# Patient Record
Sex: Male | Born: 1955
Health system: Southern US, Community
[De-identification: ages and names within clinical notes are randomized; demographics above are authoritative.]

## PROBLEM LIST (undated history)

## (undated) DIAGNOSIS — M1990 Unspecified osteoarthritis, unspecified site: Secondary | ICD-10-CM

## (undated) DIAGNOSIS — N200 Calculus of kidney: Secondary | ICD-10-CM

## (undated) DIAGNOSIS — E7800 Pure hypercholesterolemia, unspecified: Secondary | ICD-10-CM

## (undated) DIAGNOSIS — I10 Essential (primary) hypertension: Secondary | ICD-10-CM

## (undated) DIAGNOSIS — Z87442 Personal history of urinary calculi: Secondary | ICD-10-CM

## (undated) DIAGNOSIS — Z86010 Personal history of colonic polyps: Secondary | ICD-10-CM

## (undated) HISTORY — PX: APPENDECTOMY: SHX54

## (undated) HISTORY — DX: Pure hypercholesterolemia, unspecified: E78.00

## (undated) HISTORY — DX: Essential (primary) hypertension: I10

## (undated) HISTORY — DX: Calculus of kidney: N20.0

## (undated) HISTORY — PX: TONSILLECTOMY: SUR1361

## (undated) HISTORY — DX: Personal history of colonic polyps: Z86.010

---

## 2005-06-13 ENCOUNTER — Ambulatory Visit: Payer: Self-pay | Admitting: Family Medicine

## 2005-06-21 ENCOUNTER — Ambulatory Visit: Payer: Self-pay | Admitting: Family Medicine

## 2005-06-21 LAB — CONVERTED CEMR LAB
Blood Glucose, Fasting: 98 mg/dL
PSA: 0.66 ng/mL
TSH: 0.87 microintl units/mL

## 2006-11-22 ENCOUNTER — Encounter: Payer: Self-pay | Admitting: Family Medicine

## 2006-11-25 DIAGNOSIS — E7800 Pure hypercholesterolemia, unspecified: Secondary | ICD-10-CM | POA: Insufficient documentation

## 2006-12-12 ENCOUNTER — Ambulatory Visit: Payer: Self-pay | Admitting: Family Medicine

## 2006-12-12 DIAGNOSIS — I10 Essential (primary) hypertension: Secondary | ICD-10-CM | POA: Insufficient documentation

## 2006-12-18 ENCOUNTER — Ambulatory Visit: Payer: Self-pay | Admitting: Gastroenterology

## 2006-12-28 ENCOUNTER — Ambulatory Visit: Payer: Self-pay | Admitting: Family Medicine

## 2007-01-14 ENCOUNTER — Encounter: Payer: Self-pay | Admitting: Family Medicine

## 2007-01-14 ENCOUNTER — Ambulatory Visit: Payer: Self-pay | Admitting: Gastroenterology

## 2007-02-07 ENCOUNTER — Ambulatory Visit: Payer: Self-pay | Admitting: Family Medicine

## 2007-02-07 LAB — CONVERTED CEMR LAB
Albumin: 4.1 g/dL (ref 3.5–5.2)
Alkaline Phosphatase: 71 units/L (ref 39–117)
Bilirubin, Direct: 0.1 mg/dL (ref 0.0–0.3)
Cholesterol: 177 mg/dL (ref 0–200)
GFR calc Af Amer: 102 mL/min
PSA: 0.57 ng/mL (ref 0.10–4.00)
Potassium: 3.8 meq/L (ref 3.5–5.1)
Total Bilirubin: 1.4 mg/dL — ABNORMAL HIGH (ref 0.3–1.2)
Total Protein: 7.2 g/dL (ref 6.0–8.3)
Triglycerides: 140 mg/dL (ref 0–149)

## 2007-02-11 ENCOUNTER — Ambulatory Visit: Payer: Self-pay | Admitting: Family Medicine

## 2007-10-18 ENCOUNTER — Emergency Department (HOSPITAL_COMMUNITY): Admission: EM | Admit: 2007-10-18 | Discharge: 2007-10-18 | Payer: Self-pay | Admitting: Emergency Medicine

## 2008-04-05 ENCOUNTER — Ambulatory Visit: Payer: Self-pay | Admitting: Family Medicine

## 2008-04-05 DIAGNOSIS — M25559 Pain in unspecified hip: Secondary | ICD-10-CM | POA: Insufficient documentation

## 2008-04-09 LAB — CONVERTED CEMR LAB
Albumin: 4.1 g/dL (ref 3.5–5.2)
Alkaline Phosphatase: 72 units/L (ref 39–117)
BUN: 9 mg/dL (ref 6–23)
Chloride: 102 meq/L (ref 96–112)
Creatinine, Ser: 0.8 mg/dL (ref 0.4–1.5)
Eosinophils Absolute: 0.1 10*3/uL (ref 0.0–0.7)
Glucose, Bld: 83 mg/dL (ref 70–99)
MCHC: 35.1 g/dL (ref 30.0–36.0)
MCV: 82.9 fL (ref 78.0–100.0)
Monocytes Absolute: 0.6 10*3/uL (ref 0.1–1.0)
Neutrophils Relative %: 59.3 % (ref 43.0–77.0)
PSA: 0.63 ng/mL (ref 0.10–4.00)
Platelets: 216 10*3/uL (ref 150–400)
Sodium: 141 meq/L (ref 135–145)
Total Bilirubin: 0.8 mg/dL (ref 0.3–1.2)

## 2008-08-18 ENCOUNTER — Ambulatory Visit: Payer: Self-pay | Admitting: Family Medicine

## 2008-08-18 LAB — CONVERTED CEMR LAB
Albumin: 4.1 g/dL (ref 3.5–5.2)
Alkaline Phosphatase: 65 units/L (ref 39–117)
Basophils Absolute: 0 10*3/uL (ref 0.0–0.1)
Basophils Relative: 0 % (ref 0.0–3.0)
Bilirubin, Direct: 0 mg/dL (ref 0.0–0.3)
CO2: 31 meq/L (ref 19–32)
Calcium: 9.1 mg/dL (ref 8.4–10.5)
Eosinophils Absolute: 0.2 10*3/uL (ref 0.0–0.7)
Eosinophils Relative: 2.3 % (ref 0.0–5.0)
Glucose, Bld: 105 mg/dL — ABNORMAL HIGH (ref 70–99)
HDL: 39.6 mg/dL (ref >39.00)
Lymphs Abs: 2 10*3/uL (ref 0.7–4.0)
MCHC: 34.4 g/dL (ref 30.0–36.0)
Monocytes Absolute: 0.7 10*3/uL (ref 0.1–1.0)
Neutro Abs: 4.4 10*3/uL (ref 1.4–7.7)
PSA: 0.64 ng/mL (ref 0.10–4.00)
Platelets: 236 10*3/uL (ref 150.0–400.0)
Sodium: 142 meq/L (ref 135–145)
Total CHOL/HDL Ratio: 5
Total Protein: 7.5 g/dL (ref 6.0–8.3)
Triglycerides: 231 mg/dL — ABNORMAL HIGH (ref 0.0–149.0)
WBC: 7.3 10*3/uL (ref 4.5–10.5)

## 2008-09-06 ENCOUNTER — Ambulatory Visit: Payer: Self-pay | Admitting: Family Medicine

## 2008-09-06 DIAGNOSIS — R7303 Prediabetes: Secondary | ICD-10-CM | POA: Insufficient documentation

## 2008-09-06 DIAGNOSIS — R7309 Other abnormal glucose: Secondary | ICD-10-CM

## 2008-09-16 ENCOUNTER — Telehealth: Payer: Self-pay | Admitting: Family Medicine

## 2008-10-14 ENCOUNTER — Ambulatory Visit: Payer: Self-pay | Admitting: Internal Medicine

## 2008-10-14 ENCOUNTER — Telehealth: Payer: Self-pay | Admitting: Internal Medicine

## 2008-10-14 DIAGNOSIS — L03119 Cellulitis of unspecified part of limb: Secondary | ICD-10-CM

## 2008-10-14 DIAGNOSIS — L02519 Cutaneous abscess of unspecified hand: Secondary | ICD-10-CM | POA: Insufficient documentation

## 2009-02-09 ENCOUNTER — Ambulatory Visit: Payer: Self-pay | Admitting: Family Medicine

## 2009-02-09 DIAGNOSIS — R079 Chest pain, unspecified: Secondary | ICD-10-CM | POA: Insufficient documentation

## 2009-05-26 ENCOUNTER — Encounter: Payer: Self-pay | Admitting: Family Medicine

## 2009-05-26 ENCOUNTER — Telehealth: Payer: Self-pay | Admitting: Family Medicine

## 2009-09-06 ENCOUNTER — Encounter (INDEPENDENT_AMBULATORY_CARE_PROVIDER_SITE_OTHER): Payer: Self-pay | Admitting: *Deleted

## 2009-12-01 ENCOUNTER — Telehealth: Payer: Self-pay | Admitting: Family Medicine

## 2010-01-09 ENCOUNTER — Ambulatory Visit: Payer: Self-pay | Admitting: Family Medicine

## 2010-01-12 ENCOUNTER — Emergency Department (HOSPITAL_COMMUNITY)
Admission: EM | Admit: 2010-01-12 | Discharge: 2010-01-12 | Payer: Self-pay | Source: Home / Self Care | Admitting: Emergency Medicine

## 2010-02-04 ENCOUNTER — Ambulatory Visit
Admission: RE | Admit: 2010-02-04 | Discharge: 2010-02-04 | Payer: Self-pay | Source: Home / Self Care | Attending: Family Medicine | Admitting: Family Medicine

## 2010-02-28 NOTE — Letter (Signed)
Summary: Nadara Eaton letter  [place] at [facility]  [address] [place], [place] [address]   Phone: [phone]  Fax: [phone]       09/06/2009 MRN: [id number]  Orson Raisch [address] [place], [place]  [address]  Dear Mr. Basista,  [facility] [place], and [place] announce the retirement of Arta Silence, M.D., from full-time practice at the [facility] office effective July 28, 2009 and his plans of returning part-time.  It is important to Dr. Hetty Ely and to our practice that you understand that [facility] has seven physicians in our office for your health care needs.  We will continue to offer the same exceptional care that you have today.    Dr. Hetty Ely has spoken to many of you about his plans for retirement and returning part-time in the fall.   We will continue to work with you through the transition to schedule appointments for you in the office and meet the high standards that [place] is committed to.   Again, it is with great pleasure that we share the news that Dr. Hetty Ely will return to [facility] at [facility] in October of 2011 with a reduced schedule.    If you have any questions, or would like to request an appointment with one of our physicians, please call us at [phone] and press the option for Scheduling an appointment.  We take pleasure in providing you with excellent patient care and look forward to seeing you at your next office visit.  Our [facility] Physicians are:  Tillman Abide, M.D. Laurita Quint, M.D. Roxy Manns, M.D. Kerby Nora, M.D. Hannah Beat, M.D. Ruthe Mannan, M.D. We proudly welcomed Raechel Ache, M.D. and Eustaquio Boyden, M.D. to the practice in July/August 2011.  Sincerely,  [facility]

## 2010-02-28 NOTE — Assessment & Plan Note (Signed)
Summary: CHEST PAIN/TIGHTNESS /RBH   Vital Signs:  Patient profile:   55 year old male Height:      72 inches Weight:      229 pounds BMI:     31.17 O2 Sat:      93 % on Room air Pulse rate:   106 / minute Pulse rhythm:   regular BP sitting:   140 / 98  (left arm) Cuff size:   regular  Vitals Entered By: Benny Lennert CMA (AAMA) (February 09, 2009 10:07 AM)  O2 Flow:  Room air  Serial Vital Signs/Assessments:  Time      Position  BP       Pulse  Resp  Temp     By 10:20 AM                     92                    Jshon Ibe MD                                PEF    PreRx  PostRx Time      O2 Sat  O2 Type     L/min  L/min  L/min   By 10:20 AM  98  %   Room air                          Karleen Hampshire Kaneisha Ellenberger MD   History of Present Illness: Chief complaint chest pain short ness of breathe  55 year old male:  Hot and cold chills Got up to 102 fever  Acute Visit History:      The patient complains of chest pain, cough, fever, and nasal discharge.  These symptoms began 3 days ago.  He denies headache, nausea, sinus problems, sore throat, and vomiting.        His highest temperature has been 102.  This temperature was recorded last evening.  The fever has been off and on.        The patient notes shortness of breath.  The character of the cough is described as productive.  He has no history of COPD.  There is no history of wheezing, sleep interference, respiratory retractions, tachypnea, cyanosis, or interference with oral intake associated with his cough.        Urine output has been normal.  He is tolerating clear liquids.        Allergies (verified): No Known Drug Allergies  Past History:  Past medical, surgical, family and social histories (including risk factors) reviewed, and no changes noted (except as noted below).  Past Medical History: Reviewed history from 04/05/2008 and no changes required. ESSENTIAL HYPERTENSION, BENIGN (ICD-401.1) FM HX MALIGNANT NEOPLASM  GASTROINTESTINAL TRACT (ICD-V16.0) HYPERCHOLESTEROLEMIA (ICD-272.0)    Past Surgical History: Reviewed history from 09/06/2008 and no changes required. TONSILLECTOMY  AS CHILD COLONOSCOPY NML 01/14/2007  Family History: Reviewed history from 09/06/2008 and no changes required. Father:? NO KNOWLEDGE  Mother: DECEASED 16 YOA ENLARGED HEART BROTHER DECEASED  91 YOA HIV DZ. SISTER 44YOA DECEASED FROM COLON CANCER SISTER  dec  56  Breast cancer SISTER  A 62   CV: NEGATIVE HBP: + SELF; + SISTER DM: NEGATIVE GOUT/ARTHIITIS: PROSTATE CANCER: NEGATIVE BREAST CANCER: + OLDEST SISTER COLON CANCER: + SISTER DECEASED AT 44 YOA DEPRESSION: NEGATIVE ETOH/DRUG ABUSE: OTHER: NEGATIVE STROKE  Social History: Reviewed history from 11/22/2006 and no changes required. Marital Status: Married: REMARRIED LIVES WITH WIFE  Children: 2 DAUGHTERS FROM PREVIOUS MARRIAGE Occupation:[employer]/ [employer]   Review of Systems       REVIEW OF SYSTEMS GEN: Acute illness details above. CV: No chest pain or SOB GI: No noted N or V Otherwise, pertinent positives and negatives are noted in the HPI.   Physical Exam  Additional Exam:  GEN: A and O x 3. WDWN. NAD.    ENT: Nose clear, ext NML.  No LAD.  No JVD.  TM's clear. Oropharynx clear.  PULM: Normal WOB, no distress. No crackles, no wheezes. coarse rhonchi diffusely, but no distress. CV: RRR, no M/G/R, No rubs, No JVD.   ABD: S, NT, ND, + BS. No rebound. No guarding. No HSM.   EXT: warm and well-perfused, No c/c/e. PSYCH: Pleasant and conversant.    Impression & Recommendations:  Problem # 1:  BRONCHITIS- ACUTE (ICD-466.0) Assessment New Pulse ox 98% on my recheck no tachynpeic  EKG: Normal sinus rhythm. Normal axis, normal R wave progression, No acute ST elevation or depression.   Focal lung infection, but no crackles outpatient management  The following medications were removed from the medication list:    Amoxicillin-pot  Clavulanate 875-125 Mg Tabs (Amoxicillin-pot clavulanate) .Marland Kitchen... 1 tab by mouth two times a day after eating for finger infection His updated medication list for this problem includes:    Doxycycline Hyclate 100 Mg Caps (Doxycycline hyclate) .Marland Kitchen... Take 1 tab twice a day  Problem # 2:  CHEST PAIN (ICD-786.50) Assessment: New EKG: Normal sinus rhythm. Normal axis, normal R wave progression, No acute ST elevation or depression.   c/w lung infection  Complete Medication List: 1)  Hydrochlorothiazide 25 Mg Tabs (Hydrochlorothiazide) .Marland Kitchen.. 1 daily by mouth 2)  Doxycycline Hyclate 100 Mg Caps (Doxycycline hyclate) .... Take 1 tab twice a day Prescriptions: DOXYCYCLINE HYCLATE 100 MG CAPS (DOXYCYCLINE HYCLATE) Take 1 tab twice a day  #20 x 0   Entered and Authorized by:   Hannah Beat MD   Signed by:   Hannah Beat MD on 02/09/2009   Method used:   [facility]  [address] [phone][facility] (retail)       [address]       [place], [place]  [id number]       Ph: [phone] or [phone]       Fax: [phone]   RxID:   [id number]   Current Allergies (reviewed today): No known allergies

## 2010-02-28 NOTE — Progress Notes (Signed)
Summary: Dizzy  Phone Note Call from Patient   Caller: Patient Call For: Shaune Leeks MD Summary of Call: Patient came into the office today complaining of being dizzy for one week.  He says that he has no SOB, no headaches, no wheezing.  He says that these dizzy spells come on at any time.  He could be at work, at home, and doing nothing in particular.  He has not had any medication changes.  Talked with Dr. Hetty Ely and asked if it was ok to schedule him to be seen on Monday.  Dr. Hetty Ely agreed.  Advised patient to keep a journal of how many times these dizzy spells come on, what he is doing at that time, and what time of day it is.  Also advised him that if he becomes SOB, starts wheezing, or become lightheaded he needs to have someone take him to an Urgent Care or the ER.  Appt scheduled for 3:45 on Monday with Dr. Hetty Ely.   Initial call taken by: Linde Gillis CMA Duncan Dull),  May 26, 2009 8:13 AM  Follow-up for Phone Call        Agree. Follow-up by: Shaune Leeks MD,  May 26, 2009 8:16 AM

## 2010-02-28 NOTE — Letter (Signed)
Summary: Schaller Retirement letter  [place] at [place]  [address]   [place], [place] [address]   Phone: [phone]  Fax: [phone]       09/06/2009 MRN: [id number]  Jerauld Stipe [address] [place], [place]  [address]  Dear Mr. Anastos,  [facility] [place], and [place] announce the retirement of ROBERT N. SCHALLER, M.D., from full-time practice at the [place] office effective July 28, 2009 and his plans of returning part-time.  It is important to Dr. Schaller and to our practice that you understand that [facility] [place] has seven physicians in our office for your health care needs.  We will continue to offer the same exceptional care that you have today.    Dr. Schaller has spoken to many of you about his plans for retirement and returning part-time in the fall.   We will continue to work with you through the transition to schedule appointments for you in the office and meet the high standards that [facility] is committed to.   Again, it is with great pleasure that we share the news that Dr. Schaller will return to [facility] [place] in October of 2011 with a reduced schedule.    If you have any questions, or would like to request an appointment with one of our physicians, please call us at [phone] and press the option for Scheduling an appointment.  We take pleasure in providing you with excellent patient care and look forward to seeing you at your next office visit.  Our [place] Physicians are:  Richard Letvak, M.D. Robert Schaller, M.D. Marne Tower, M.D. Amy Bedsole, M.D. Spencer Copland, M.D. Talia Aron, M.D. We proudly welcomed Shaw Duncan, M.D. and Javier Gutierrez, M.D. to the practice in July/August 2011.  Sincerely,  [facility] [place] 

## 2010-02-28 NOTE — Progress Notes (Signed)
Summary: RX HCTZ  Phone Note Refill Request Call back at [phone] Message from:  [facility] on December 01, 2009 4:31 PM  Refills Requested: Medication #1:  HYDROCHLOROTHIAZIDE 25 MG  TABS 1 daily by mouth. PATIENT WAS GIVEN #15  ON 11/08/09 AND INFORMED TO SCHEDULE AN APPOINTMENT FOR FURTHER REFILLS. NO APPOINTMENT SCHEDULED.   Method Requested: Electronic Initial call taken by: Sydell Axon LPN,  December 01, 2009 4:33 PM  Follow-up for Phone Call        Rx called to pharmacy as instructed. Left a message on patient's voicemail to call and schedule an appointment. Follow-up by: Sydell Axon LPN,  December 01, 2009 4:49 PM    Prescriptions: HYDROCHLOROTHIAZIDE 25 MG  TABS (HYDROCHLOROTHIAZIDE) 1 daily by mouth  #10 x 0   Entered and Authorized by:   Shaune Leeks MD   Signed by:   Shaune Leeks MD on 12/01/2009   Method used:   Telephoned to ...       [facility]  [address] [phone]* (retail)       [address]       [place], [place]  [address]       Ph: [phone] or [phone]       Fax: [phone]   RxID:   [id number]  Will need to be seen to get further refills. Shaune Leeks MD  December 01, 2009 4:43 PM

## 2010-02-28 NOTE — Letter (Signed)
Summary: Out of Work  [facility] [employer] [facility] at [facility]  [address] [place], [place] [address]   Phone: [phone]  Fax: [phone]    May 26, 2009   Employee:  Josuha Carlile    To Whom It May Concern:   For Medical reasons, please excuse the above named employee from work for the following dates:  Start:   05/26/2009  End:   05/26/2009 Patient was seen in our office this morning and may return to work today.  If you need additional information, please feel free to contact our office.         Sincerely,    Linde Gillis CMA (AAMA)

## 2010-02-28 NOTE — Letter (Signed)
Summary: Out of Work  [facility] at [facility]  [address] [place], [place] [address]   Phone: [phone]  Fax: [phone]    February 09, 2009   Employee:  Jaymari Basu    To Whom It May Concern:   For Medical reasons, please excuse the above named employee from work for the following dates:  Start:   02/09/2009  End:   01/14/2010  If you need additional information, please feel free to contact our office.         Sincerely,    Hannah Beat MD

## 2010-03-02 NOTE — Assessment & Plan Note (Signed)
Summary: MED REFILL/DLO   Vital Signs:  Patient profile:   55 year old male Height:      72 inches Weight:      238.25 pounds BMI:     32.43 Temp:     98.6 degrees F oral Pulse rate:   96 / minute Pulse rhythm:   regular BP sitting:   150 / 90  (left arm) Cuff size:   regular  Vitals Entered By: Benny Lennert CMA Duncan Dull) (January 09, 2010 3:52 PM)  History of Present Illness: Chief complaint medication refill  55 year old male:  Feeling OK  HTN: ran out of blood pressue medications.   Hypertension History:      He denies headache, chest pain, palpitations, dyspnea with exertion, orthopnea, PND, peripheral edema, visual symptoms, neurologic problems, syncope, and side effects from treatment.        Positive major cardiovascular risk factors include male age 57 years old or older, hyperlipidemia, and hypertension.  Negative major cardiovascular risk factors include non-tobacco-user status.    Allergies (verified): No Known Drug Allergies  Past History:  Past medical, surgical, family and social histories (including risk factors) reviewed, and no changes noted (except as noted below).  Past Medical History: Reviewed history from 04/05/2008 and no changes required. ESSENTIAL HYPERTENSION, BENIGN (ICD-401.1) FM HX MALIGNANT NEOPLASM GASTROINTESTINAL TRACT (ICD-V16.0) HYPERCHOLESTEROLEMIA (ICD-272.0)    Past Surgical History: Reviewed history from 09/06/2008 and no changes required. TONSILLECTOMY  AS CHILD COLONOSCOPY NML 01/14/2007  Family History: Reviewed history from 09/06/2008 and no changes required. Father:? NO KNOWLEDGE  Mother: DECEASED 8 YOA ENLARGED HEART BROTHER DECEASED  44 YOA HIV DZ. SISTER 44YOA DECEASED FROM COLON CANCER SISTER  dec  56  Breast cancer SISTER  A 62   CV: NEGATIVE HBP: + SELF; + SISTER DM: NEGATIVE GOUT/ARTHIITIS: PROSTATE CANCER: NEGATIVE BREAST CANCER: + OLDEST SISTER COLON CANCER: + SISTER DECEASED AT 40 YOA DEPRESSION:  NEGATIVE ETOH/DRUG ABUSE: OTHER: NEGATIVE STROKE  Social History: Reviewed history from 11/22/2006 and no changes required. 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 CV: No chest pain or SOB GI: No noted N or V Otherwise, pertinent positives and negatives are noted in the HPI.   Physical Exam  Additional Exam:  GEN: WDWN, NAD, Non-toxic, A & O x 3 HEENT: Atraumatic, Normocephalic. Neck supple. No masses, No LAD. Ears and Nose: No external deformity. CV: RRR, No M/G/R. No JVD. No thrill. No extra heart sounds. PULM: CTA B, no wheezes, crackles, rhonchi. No retractions. No resp. distress. No accessory muscle use. EXTR: No c/c/e NEURO: Normal gait.  PSYCH: Normally interactive. Conversant. Not depressed or anxious appearing.  Calm demeanor.     Impression & Recommendations:  Problem # 1:  ESSENTIAL HYPERTENSION, BENIGN (ICD-401.1) Assessment Deteriorated  His updated medication list for this problem includes:    Hydrochlorothiazide 25 Mg Tabs (Hydrochlorothiazide) .Marland Kitchen... 1 daily by mouth  Complete Medication List: 1)  Hydrochlorothiazide 25 Mg Tabs (Hydrochlorothiazide) .Marland Kitchen.. 1 daily by mouth  Other Orders: Admin 1st Vaccine ([address]) Flu Vaccine 67yrs + ([address])  Hypertension Assessment/Plan:      The patient's hypertensive risk group is category B: At least one risk factor (excluding diabetes) with no target organ damage.  Today's blood pressure is 150/90.    Patient Instructions: 1)  CPX with  Dr. Hetty Ely at the patient's convenience 2)  Prephysical Labs, several days before, fasting 3)  BMP, HFP, FLP, CBC with diff, TSH, PSA: v77.91, v58.69, v76.44  Prescriptions: HYDROCHLOROTHIAZIDE 25 MG  TABS (HYDROCHLOROTHIAZIDE) 1 daily by mouth  #30 x 11   Entered and Authorized by:    Hannah Beat MD   Signed by:   Hannah Beat MD on 01/09/2010   Method used:   [facility]        [facility]  [address] [phone]* (retail)       [address]       [place], [place]  [address]       Ph: [phone] or [phone]       Fax: [phone]   RxID:   [id number]    Orders Added: 1)  Admin 1st Vaccine [90471] 2)  Flu Vaccine 54yrs + [[address]956] 3)  Est. Patient Level III [[address]]    Current Allergies (reviewed today): No known allergies             Flu Vaccine Consent Questions     Do you have a history of severe allergic reactions to this vaccine? no    Any prior history of allergic reactions to egg and/or gelatin? no    Do you have a sensitivity to the preservative Thimersol? no    Do you have a past history of Guillan-Barre Syndrome? no    Do you currently have an acute febrile illness? no    Have you ever had a severe reaction to latex? no    Vaccine information given and explained to patient? yes    Are you currently pregnant? no    Lot Number:[id number]   Exp Date:07/29/2010   Site Given  Left Deltoid IM   .[email or web address]

## 2010-03-02 NOTE — Assessment & Plan Note (Signed)
Summary: SORE THROAT X 6 DAYS/VJ   Vital Signs:  Patient profile:   55 year old male Height:      72 inches Weight:      232 pounds BMI:     31.58 Temp:     98.0 degrees F Pulse rate:   88 / minute BP sitting:   138 / 90  (left arm) Cuff size:   large  Vitals Entered By: Payton Spark CMA (February 04, 2010 9:19 AM) CC: Sinus congestion, chills and ST x 2 weeks.   CC:  Sinus congestion and chills and ST x 2 weeks.Marland Kitchen  History of Present Illness: 55 y/o mm nonsmoker but sec...wife smokes...who comes in for eval of sore throat cough x 2 weeks....ros neg...sym. started after around sick grandkids  Current Medications (verified): 1)  Hydrochlorothiazide 25 Mg  Tabs (Hydrochlorothiazide) .Marland Kitchen.. 1 Daily By Mouth  Allergies (verified): No Known Drug Allergies  Past History:  Past medical, surgical, family and social histories (including risk factors) reviewed for relevance to current acute and chronic problems.  Past Medical History: Reviewed history from 04/05/2008 and no changes required. ESSENTIAL HYPERTENSION, BENIGN (ICD-401.1) FM HX MALIGNANT NEOPLASM GASTROINTESTINAL TRACT (ICD-V16.0) HYPERCHOLESTEROLEMIA (ICD-272.0)    Past Surgical History: Reviewed history from 09/06/2008 and no changes required. TONSILLECTOMY  AS CHILD COLONOSCOPY NML 01/14/2007  Family History: Reviewed history from 09/06/2008 and no changes required. Father:? NO KNOWLEDGE  Mother: DECEASED 34 YOA ENLARGED HEART BROTHER DECEASED  49 YOA HIV DZ. SISTER 44YOA DECEASED FROM COLON CANCER SISTER  dec  56  Breast cancer SISTER  A 62   CV: NEGATIVE HBP: + SELF; + SISTER DM: NEGATIVE GOUT/ARTHIITIS: PROSTATE CANCER: NEGATIVE BREAST CANCER: + OLDEST SISTER COLON CANCER: + SISTER DECEASED AT 25 YOA DEPRESSION: NEGATIVE ETOH/DRUG ABUSE: OTHER: NEGATIVE STROKE  Social History: Reviewed history from 11/22/2006 and no changes required. 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 No apparent alopecia or balding. Eyes:  No corneal or conjunctival inflammation noted. EOMI. Perrla. Funduscopic exam benign, without hemorrhages, exudates or papilledema. Vision grossly normal. Ears:  External ear exam shows no significant lesions or deformities.  Otoscopic examination reveals clear canals, tympanic membranes are intact bilaterally without bulging, retraction, inflammation or discharge. Hearing is grossly normal bilaterally. Nose:  External nasal examination shows no deformity or inflammation. Nasal mucosa are pink and moist without lesions or exudates. Mouth:  Oral mucosa and oropharynx without lesions or exudates.  Teeth in good repair. Neck:  No deformities, masses, or tenderness noted. Lungs:  Normal respiratory effort, chest expands symmetrically. Lungs are clear to auscultation, no crackles or wheezes.   Problems:  Medical Problems Added: 1)  Dx of Viral Infection-unspec  (ICD-079.99)  Impression & Recommendations:  Problem # 1:  VIRAL INFECTION-UNSPEC (ICD-079.99) Assessment New  His updated medication list for this problem includes:    Hydromet 5-1.5 Mg/89ml Syrp (Hydrocodone-homatropine) .Marland Kitchen... 1/2 to 1 tsp three times a day prn  Complete Medication List: 1)  Hydrochlorothiazide 25 Mg Tabs (Hydrochlorothiazide) .Marland Kitchen.. 1 daily by mouth 2)  Hydromet 5-1.5 Mg/66ml Syrp (Hydrocodone-homatropine) .... 1/2 to 1 tsp three times a day prn  Other Orders: Rapid Strep ([address])  Patient Instructions: 1)  stay in a smokefree env 2)  drink lots of water 3)  vaporizer at bedtime 4)  hydromet 1/2 to 1 tsp at bedtime prn Prescriptions: HYDROMET 5-1.5 MG/5ML SYRP  (HYDROCODONE-HOMATROPINE) 1/2 to 1 tsp three times a day prn  #8oz x 1   Entered and Authorized by:   Roderick Pee MD   Signed by:   Roderick Pee MD on 02/04/2010   Method used:   Print then Give to Patient   RxID:   [id number]    Orders Added: 1)  Rapid Strep [95638] 2)  Est. Patient Level III [75643]    Laboratory Results    Other Tests  Rapid Strep: negative

## 2010-03-09 ENCOUNTER — Other Ambulatory Visit: Payer: Self-pay

## 2010-03-16 ENCOUNTER — Encounter: Payer: Self-pay | Admitting: Family Medicine

## 2010-03-28 ENCOUNTER — Encounter: Payer: Self-pay | Admitting: Family Medicine

## 2010-04-17 ENCOUNTER — Other Ambulatory Visit: Payer: Self-pay | Admitting: Family Medicine

## 2010-04-17 ENCOUNTER — Encounter: Payer: Self-pay | Admitting: *Deleted

## 2010-04-17 ENCOUNTER — Other Ambulatory Visit (INDEPENDENT_AMBULATORY_CARE_PROVIDER_SITE_OTHER): Payer: BC Managed Care – PPO

## 2010-04-17 DIAGNOSIS — Z125 Encounter for screening for malignant neoplasm of prostate: Secondary | ICD-10-CM

## 2010-04-17 DIAGNOSIS — E785 Hyperlipidemia, unspecified: Secondary | ICD-10-CM

## 2010-04-17 DIAGNOSIS — R079 Chest pain, unspecified: Secondary | ICD-10-CM

## 2010-04-17 DIAGNOSIS — E7800 Pure hypercholesterolemia, unspecified: Secondary | ICD-10-CM

## 2010-04-17 DIAGNOSIS — R7309 Other abnormal glucose: Secondary | ICD-10-CM

## 2010-04-17 LAB — CBC WITH DIFFERENTIAL/PLATELET
Basophils Absolute: 0 10*3/uL (ref 0.0–0.1)
Basophils Relative: 0.4 % (ref 0.0–3.0)
Eosinophils Relative: 2.9 % (ref 0.0–5.0)
Lymphocytes Relative: 25 % (ref 12.0–46.0)
Lymphs Abs: 2 10*3/uL (ref 0.7–4.0)
MCV: 85.3 fl (ref 78.0–100.0)
Monocytes Absolute: 0.7 10*3/uL (ref 0.1–1.0)
Neutro Abs: 4.9 10*3/uL (ref 1.4–7.7)
Neutrophils Relative %: 62.5 % (ref 43.0–77.0)
RDW: 13.6 % (ref 11.5–14.6)

## 2010-04-17 LAB — HEPATIC FUNCTION PANEL
ALT: 37 U/L (ref 0–53)
AST: 26 U/L (ref 0–37)
Albumin: 4.5 g/dL (ref 3.5–5.2)
Bilirubin, Direct: 0.1 mg/dL (ref 0.0–0.3)
Total Bilirubin: 0.7 mg/dL (ref 0.3–1.2)
Total Protein: 7.3 g/dL (ref 6.0–8.3)

## 2010-04-17 LAB — BASIC METABOLIC PANEL
Calcium: 9.4 mg/dL (ref 8.4–10.5)
Creatinine, Ser: 0.9 mg/dL (ref 0.4–1.5)
GFR: 99.06 mL/min (ref >60.00)
Glucose, Bld: 106 mg/dL — ABNORMAL HIGH (ref 70–99)
Sodium: 136 mEq/L (ref 135–145)

## 2010-04-17 LAB — LIPID PANEL: Total CHOL/HDL Ratio: 4

## 2010-04-20 ENCOUNTER — Ambulatory Visit (INDEPENDENT_AMBULATORY_CARE_PROVIDER_SITE_OTHER): Payer: BC Managed Care – PPO | Admitting: Family Medicine

## 2010-04-20 ENCOUNTER — Encounter: Payer: Self-pay | Admitting: Family Medicine

## 2010-04-20 VITALS — BP 132/86 | HR 80 | Temp 98.9°F | Ht 72.0 in | Wt 227.5 lb

## 2010-04-20 DIAGNOSIS — M25559 Pain in unspecified hip: Secondary | ICD-10-CM

## 2010-04-20 DIAGNOSIS — Z0000 Encounter for general adult medical examination without abnormal findings: Secondary | ICD-10-CM | POA: Insufficient documentation

## 2010-04-20 DIAGNOSIS — I10 Essential (primary) hypertension: Secondary | ICD-10-CM

## 2010-04-20 DIAGNOSIS — R079 Chest pain, unspecified: Secondary | ICD-10-CM

## 2010-04-20 DIAGNOSIS — L719 Rosacea, unspecified: Secondary | ICD-10-CM | POA: Insufficient documentation

## 2010-04-20 DIAGNOSIS — E7800 Pure hypercholesterolemia, unspecified: Secondary | ICD-10-CM

## 2010-04-20 DIAGNOSIS — R7309 Other abnormal glucose: Secondary | ICD-10-CM

## 2010-04-20 DIAGNOSIS — L02519 Cutaneous abscess of unspecified hand: Secondary | ICD-10-CM

## 2010-04-20 DIAGNOSIS — L03119 Cellulitis of unspecified part of limb: Secondary | ICD-10-CM

## 2010-04-20 MED ORDER — METRONIDAZOLE 1 % EX GEL: Freq: Every day | CUTANEOUS | Status: DC

## 2010-04-20 NOTE — Assessment & Plan Note (Signed)
High..The patient is asked to make an attempt to improve diet and exercise patterns to aid in medical management of this problem.

## 2010-04-20 NOTE — Assessment & Plan Note (Signed)
Resolved

## 2010-04-20 NOTE — Assessment & Plan Note (Signed)
New diagnosis. Given medicatiopn to try. RTC if not helpful.

## 2010-04-20 NOTE — Assessment & Plan Note (Signed)
Now having sciatica rather than hip pain. Discussed conservative measures and exercises to do.

## 2010-04-20 NOTE — Patient Instructions (Signed)
Work hard on diet and exercise. Do exercises for back daily. RTC prn or one year for recheck. Colonoscopy next year.

## 2010-04-20 NOTE — Progress Notes (Signed)
  Subjective:    [name], male    DOB: 05/26/1955, 55 y.o.   MRN: [id number]  HPI   Pt here for Comp Exam. Lacie Draft well except for occas left sciatica. Discussed. He had cellulitis of the hand which is now resolved and had a URI this month which also has resolved. He was seen for L hip pain which has now gone into L sciatica. He also has developed a rash in the malar distribution.    Review of Systems  Constitutional: Negative for fever, chills, activity change, appetite change and fatigue.  HENT: Negative for hearing loss, ear pain, nosebleeds, neck pain, neck stiffness, tinnitus and ear discharge.   Eyes: Negative for pain, discharge and visual disturbance.  Respiratory: Negative for cough, chest tightness, shortness of breath and wheezing.   Cardiovascular: Negative for chest pain and palpitations.  Gastrointestinal: Negative for nausea, vomiting, abdominal pain, diarrhea, constipation, blood in stool, abdominal distention and anal bleeding.  Genitourinary: Negative for dysuria, decreased urine volume and difficulty urinating.  Musculoskeletal: Negative for myalgias, back pain and arthralgias.  Skin: Negative for color change and rash.  Neurological: Negative for dizziness, tremors, syncope and numbness.  Hematological: Negative for adenopathy. Does not bruise/bleed easily.  Psychiatric/Behavioral: Negative for agitation.       Objective:   Physical Exam  Constitutional: He is oriented to person, place, and time. He appears well-developed and well-nourished. No distress.  HENT:  Head: Normocephalic and atraumatic.  Right Ear: External ear normal.  Left Ear: External ear normal.  Nose: Nose normal.  Mouth/Throat: Oropharynx is clear and moist.  Eyes: Conjunctivae and EOM are normal. Pupils are equal, round, and reactive to light. No scleral icterus.  Neck: No thyromegaly present.  Cardiovascular: Normal rate, regular rhythm, normal heart sounds and intact distal  pulses.   No murmur heard. Pulmonary/Chest: Effort normal and breath sounds normal. No respiratory distress. He has no wheezes. He exhibits no tenderness.  Abdominal: Soft. Bowel sounds are normal. He exhibits no distension and no mass. There is no tenderness.  Genitourinary: Rectum normal, prostate normal and penis normal. Guaiac negative stool.  Musculoskeletal: Normal range of motion. He exhibits no edema and no tenderness.       C/O L sciatica but no tenderness noted over L/S area.  Lymphadenopathy:    He has no cervical adenopathy.  Neurological: He is oriented to person, place, and time. Coordination normal.  Skin: Skin is dry. Rash noted. He is not diaphoretic. No erythema.       Facial rash in the malar distribution.  Psychiatric: He has a normal mood and affect. His behavior is normal. Judgment and thought content normal.          Assessment & Plan:    Physical Exam.

## 2010-04-20 NOTE — Assessment & Plan Note (Signed)
Good control with good labs. Cont curr meds.

## 2010-04-20 NOTE — Assessment & Plan Note (Signed)
Trig high as is FBS..he knows he can be more careful with his diet and he knows he should be exercising more. Should be able to get both of these under control.

## 2010-06-13 NOTE — Assessment & Plan Note (Signed)
[facility]                                 ON-CALL NOTE   NAME:REVERONMaxi, Rodas                      MRN:          [id number]  DATE:10/18/2007                            DOB:          [birth year]    CALLER:  Ms. Enid Derry.   PHONE NUMBER:  [phone]   TIME OF CALL:  At 8:31 a.m.   REGULAR PHYSICIAN:  Arta Silence, MD   CHIEF COMPLAINT:  High fever and shortness of breath.   Ms. Bonsignore states that they just got back from [place] [facility].  They both  came home sick with cold.  However, Mr. Benavides seems to be getting much  worse.  He has had a high fever, sore throat, and headache.  He seems to  be getting worse.  She has been giving him cough syrup and hot tea and  Tylenol.  This morning, he is struggling to breathe.  He is complaining  of shortness of breath and his chest hurts.  She is worried he is  getting worse.  I told her in light of his shortness of breath, to take  him to the emergency room for evaluation, to call 911 if they needed  transport, and she thinks she can take him herself.     Marne A. Tower, MD  Electronically Signed    MAT/MedQ  DD: 10/18/2007  DT: 10/18/2007  Job #: [id number]

## 2010-07-06 ENCOUNTER — Other Ambulatory Visit: Payer: Self-pay | Admitting: Family Medicine

## 2010-07-06 NOTE — Telephone Encounter (Signed)
TO pcp

## 2010-07-07 NOTE — Telephone Encounter (Signed)
Medication phoned to [facility] [address] pharmacy as instructed.

## 2010-08-04 ENCOUNTER — Ambulatory Visit (INDEPENDENT_AMBULATORY_CARE_PROVIDER_SITE_OTHER): Payer: BC Managed Care – PPO | Admitting: Family Medicine

## 2010-08-04 ENCOUNTER — Encounter: Payer: Self-pay | Admitting: Family Medicine

## 2010-08-04 DIAGNOSIS — J069 Acute upper respiratory infection, unspecified: Secondary | ICD-10-CM

## 2010-08-04 MED ORDER — AMOXICILLIN 875 MG PO TABS: 875.0000 mg | ORAL_TABLET | Freq: Two times a day (BID) | ORAL | Status: AC

## 2010-08-04 NOTE — Progress Notes (Signed)
duration of symptoms:  Started 2-3 days ago.   rhinorrhea:no Congestion:yes  ear pain:no sore throat:yes cough:minimal myalgias:no other concerns: self limited nosebleeds- this has happened before.  No fevers.  Working at [employer].  Sick contacts known.    ROS: See HPI.  Otherwise negative.    Meds, vitals, and allergies reviewed.   GEN: nad, alert and oriented HEENT: mucous membranes moist, TM w/o erythema, nasal epithelium injected with purulent discharge, OP with cobblestoning NECK: supple w/o LA CV: rrr. PULM: ctab, no inc wob ABD: soft, +bs EXT: no edema

## 2010-08-04 NOTE — Patient Instructions (Signed)
Drink plenty of fluids, take tylenol as needed, and start the amoxil in 1 week if not improved.  Take care.  Let us know if you have other concerns.   Glad to see you today.

## 2010-08-05 NOTE — Assessment & Plan Note (Signed)
Likely viral.  D/w pt about about ddx.  He'll hold the rx for abx.  Supportive tx in meantime and start the amoxil if sx don't resolve in a week.  Okay for outpatient f/u.

## 2011-02-27 ENCOUNTER — Other Ambulatory Visit: Payer: Self-pay | Admitting: *Deleted

## 2011-02-27 DIAGNOSIS — L719 Rosacea, unspecified: Secondary | ICD-10-CM

## 2011-02-27 MED ORDER — METRONIDAZOLE 1 % EX GEL: Freq: Every day | CUTANEOUS | Status: DC

## 2011-07-09 ENCOUNTER — Other Ambulatory Visit: Payer: Self-pay | Admitting: Family Medicine

## 2011-10-23 ENCOUNTER — Other Ambulatory Visit: Payer: Self-pay | Admitting: Family Medicine

## 2011-12-05 ENCOUNTER — Encounter: Payer: Self-pay | Admitting: Gastroenterology

## 2011-12-13 ENCOUNTER — Encounter: Payer: Self-pay | Admitting: Gastroenterology

## 2012-01-30 DIAGNOSIS — Z860100 Personal history of colon polyps, unspecified: Secondary | ICD-10-CM

## 2012-01-30 HISTORY — DX: Personal history of colonic polyps: Z86.010

## 2012-01-30 HISTORY — DX: Personal history of colon polyps, unspecified: Z86.0100

## 2012-01-30 HISTORY — PX: COLONOSCOPY: SHX174

## 2012-02-01 ENCOUNTER — Ambulatory Visit (AMBULATORY_SURGERY_CENTER): Payer: BC Managed Care – PPO | Admitting: *Deleted

## 2012-02-01 VITALS — Ht 72.0 in | Wt 225.8 lb

## 2012-02-01 DIAGNOSIS — Z1211 Encounter for screening for malignant neoplasm of colon: Secondary | ICD-10-CM

## 2012-02-01 MED ORDER — MOVIPREP 100 G PO SOLR: ORAL | Status: DC

## 2012-02-04 ENCOUNTER — Encounter: Payer: Self-pay | Admitting: Gastroenterology

## 2012-02-14 ENCOUNTER — Telehealth: Payer: Self-pay | Admitting: Gastroenterology

## 2012-02-14 NOTE — Telephone Encounter (Signed)
No charge. 

## 2012-02-15 ENCOUNTER — Other Ambulatory Visit: Payer: BC Managed Care – PPO | Admitting: Gastroenterology

## 2012-02-26 ENCOUNTER — Ambulatory Visit (AMBULATORY_SURGERY_CENTER): Payer: BC Managed Care – PPO | Admitting: Gastroenterology

## 2012-02-26 ENCOUNTER — Encounter: Payer: Self-pay | Admitting: Gastroenterology

## 2012-02-26 VITALS — BP 130/86 | HR 82 | Temp 96.8°F | Resp 21 | Ht 72.0 in | Wt 225.0 lb

## 2012-02-26 DIAGNOSIS — D126 Benign neoplasm of colon, unspecified: Secondary | ICD-10-CM

## 2012-02-26 DIAGNOSIS — Z1211 Encounter for screening for malignant neoplasm of colon: Secondary | ICD-10-CM

## 2012-02-26 DIAGNOSIS — Z800 Family history of malignant neoplasm of digestive organs: Secondary | ICD-10-CM

## 2012-02-26 MED ORDER — SODIUM CHLORIDE 0.9 % IV SOLN: 500.0000 mL | INTRAVENOUS | Status: DC

## 2012-02-26 NOTE — Op Note (Signed)
[facility] [address] [place], [address]   COLONOSCOPY PROCEDURE REPORT  PATIENT: Andre, [name]  MR#: [id number] BIRTHDATE: 30-Jun-1955 , 56  yrs. old GENDER: Male ENDOSCOPIST: Rachael Fee, MD PROCEDURE DATE:  02/26/2012 PROCEDURE:   Colonoscopy with snare polypectomy ASA CLASS:   Class II INDICATIONS:elevated risk screening, sister with colon cancer MEDICATIONS: Fentanyl 50 mcg IV, Versed 5 mg IV, and These medications were titrated to patient response per physician's verbal order  DESCRIPTION OF PROCEDURE:   After the risks benefits and alternatives of the procedure were thoroughly explained, informed consent was obtained.  A digital rectal exam revealed no abnormalities of the rectum.   The LB [id number]  endoscope was introduced through the anus and advanced to the cecum, which was identified by both the appendix and ileocecal valve. No adverse events experienced.   The quality of the prep was good, using MoviPrep  The instrument was then slowly withdrawn as the colon was fully examined.  COLON FINDINGS: There was one small polyp that was removed and sent to pathology.  This was sessile, located in descending segment, 3mm across, removed with cold snare.  The examination was otherwise normal.  Retroflexed views revealed no abnormalities. The time to cecum=2 minutes 40 seconds.  Withdrawal time=9 minutes 51 seconds. The scope was withdrawn and the procedure completed. COMPLICATIONS: There were no complications.  ENDOSCOPIC IMPRESSION: There was one small polyp that was removed and sent to pathology. The examination was otherwise normal.  RECOMMENDATIONS: Given your significant family history of colon cancer, you should have a repeat colonoscopy in 5 years even if the polyp removed today is NOT precancerous.  You will receive a letter within 1-2 weeks with the results of your biopsy as well as final recommendations.  Please call my  office if you have not received a letter after 3 weeks.   eSigned:  Rachael Fee, MD 02/26/2012 9:56 AM   cc: Eustaquio Boyden MD

## 2012-02-26 NOTE — Progress Notes (Signed)
The pt tolerated the colonoscopy very well. Maw   

## 2012-02-26 NOTE — Progress Notes (Signed)
Patient did not experience any of the following events: a burn prior to discharge; a fall within the facility; wrong site/side/patient/procedure/implant event; or a hospital transfer or hospital admission upon discharge from the facility. (G8907)Patient did not have preoperative order for IV antibiotic SSI prophylaxis. (G8918) ewm 

## 2012-02-26 NOTE — Patient Instructions (Addendum)
YOU HAD AN ENDOSCOPIC PROCEDURE TODAY AT [facility]: Refer to the procedure report that was given to you for any specific questions about what was found during the examination.  If the procedure report does not answer your questions, please call your gastroenterologist to clarify.  If you requested that your care partner not be given the details of your procedure findings, then the procedure report has been included in a sealed envelope for you to review at your convenience later.  YOU SHOULD EXPECT: Some feelings of bloating in the abdomen. Passage of more gas than usual.  Walking can help get rid of the air that was put into your GI tract during the procedure and reduce the bloating. If you had a lower endoscopy (such as a colonoscopy or flexible sigmoidoscopy) you may notice spotting of blood in your stool or on the toilet paper. If you underwent a bowel prep for your procedure, then you may not have a normal bowel movement for a few days.  DIET: Your first meal following the procedure should be a light meal and then it is ok to progress to your normal diet.  A half-sandwich or bowl of soup is an example of a good first meal.  Heavy or fried foods are harder to digest and may make you feel nauseous or bloated.  Likewise meals heavy in dairy and vegetables can cause extra gas to form and this can also increase the bloating.  Drink plenty of fluids but you should avoid alcoholic beverages for 24 hours.  ACTIVITY: Your care partner should take you home directly after the procedure.  You should plan to take it easy, moving slowly for the rest of the day.  You can resume normal activity the day after the procedure however you should NOT DRIVE or use heavy machinery for 24 hours (because of the sedation medicines used during the test).    SYMPTOMS TO REPORT IMMEDIATELY: A gastroenterologist can be reached at any hour.  During normal business hours, 8:30 AM to 5:00 PM Monday through Friday,  call [phone].  After hours and on weekends, please call the GI answering service at [phone]  Emergency number who will take a message and have the physician on call contact you.   Following lower endoscopy (colonoscopy or flexible sigmoidoscopy):  Excessive amounts of blood in the stool  Significant tenderness or worsening of abdominal pains  Swelling of the abdomen that is new, acute  Fever of 100F or higher  Black, tarry-looking stools  FOLLOW UP: If any biopsies were taken you will be contacted by phone or by letter within the next 1-3 weeks.  Call your gastroenterologist if you have not heard about the biopsies in 3 weeks.  Our staff will call the home number listed on your records the next business day following your procedure to check on you and address any questions or concerns that you may have at that time regarding the information given to you following your procedure. This is a courtesy call and so if there is no answer at the home number and we have not heard from you through the emergency physician on call, we will assume that you have returned to your regular daily activities without incident.  SIGNATURES/CONFIDENTIALITY: You and/or your care partner have signed paperwork which will be entered into your electronic medical record.  These signatures attest to the fact that that the information above on your After Visit Summary has been reviewed and is understood.  Full responsibility of the confidentiality of this discharge information lies with you and/or your care-partner.   Handout on polyps Repeat colon in 5 years

## 2012-02-27 ENCOUNTER — Telehealth: Payer: Self-pay | Admitting: *Deleted

## 2012-02-27 NOTE — Telephone Encounter (Signed)
  Follow up Call-  Call back number 02/26/2012  Post procedure Call Back phone  # [phone]  Permission to leave phone message Yes     Patient questions:  Do you have a fever, pain , or abdominal swelling? no Pain Score  0 *  Have you tolerated food without any problems? yes  Have you been able to return to your normal activities? yes  Do you have any questions about your discharge instructions: Diet   no Medications  no Follow up visit  no  Do you have questions or concerns about your Care? no  Actions: * If pain score is 4 or above: No action needed, pain <4.

## 2012-03-04 ENCOUNTER — Encounter: Payer: Self-pay | Admitting: Gastroenterology

## 2012-03-04 ENCOUNTER — Encounter: Payer: Self-pay | Admitting: Family Medicine

## 2012-03-05 ENCOUNTER — Encounter: Payer: Self-pay | Admitting: Family Medicine

## 2013-12-31 ENCOUNTER — Encounter: Payer: Self-pay | Admitting: Family Medicine

## 2013-12-31 ENCOUNTER — Encounter (INDEPENDENT_AMBULATORY_CARE_PROVIDER_SITE_OTHER): Payer: Self-pay

## 2013-12-31 ENCOUNTER — Ambulatory Visit (INDEPENDENT_AMBULATORY_CARE_PROVIDER_SITE_OTHER): Payer: BC Managed Care – PPO | Admitting: Family Medicine

## 2013-12-31 VITALS — BP 164/94 | HR 92 | Temp 97.8°F | Ht 72.0 in | Wt 223.5 lb

## 2013-12-31 DIAGNOSIS — E7800 Pure hypercholesterolemia, unspecified: Secondary | ICD-10-CM

## 2013-12-31 DIAGNOSIS — M7660 Achilles tendinitis, unspecified leg: Secondary | ICD-10-CM

## 2013-12-31 DIAGNOSIS — I10 Essential (primary) hypertension: Secondary | ICD-10-CM

## 2013-12-31 DIAGNOSIS — Z0000 Encounter for general adult medical examination without abnormal findings: Secondary | ICD-10-CM

## 2013-12-31 DIAGNOSIS — Z125 Encounter for screening for malignant neoplasm of prostate: Secondary | ICD-10-CM

## 2013-12-31 MED ORDER — AMLODIPINE BESYLATE 5 MG PO TABS: 5.0000 mg | ORAL_TABLET | Freq: Every day | ORAL | Status: DC

## 2013-12-31 NOTE — Assessment & Plan Note (Signed)
Too elevated today. Start amlodipine. Did not tolerate HCTZ. Consider EKG. Check labwork next week.

## 2013-12-31 NOTE — Progress Notes (Signed)
Pre visit review using our clinic review tool, if applicable. No additional management support is needed unless otherwise documented below in the visit note. 

## 2013-12-31 NOTE — Assessment & Plan Note (Signed)
L sided. Consistent today with retrocalcaneal bursitis. Treat with compression, ice, heel lift, and aleve bid x 5-7 days with food then prn. Update if not improved with this. Pt agrees with plan.

## 2013-12-31 NOTE — Patient Instructions (Addendum)
Come in next week Wed or Thursday (skip lunch) for fasting labs. We close at 7pm. I think you have retrocalcaneal bursitis of L achilles. Treat with continued ice, rest, and compression. Start aleve 220mg  twice daily with food for 5-7 days then as needed for pain/swelling. Look into heel lift to put in left shoe. Start amlodipine 5mg  daily for blood pressure. Return as needed or in 3 months for hypertension follow up.  Bursitis Bursitis is a swelling and soreness (inflammation) of a fluid-filled sac (bursa) that overlies and protects a joint. It can be caused by injury, overuse of the joint, arthritis or infection. The joints most likely to be affected are the elbows, shoulders, hips and knees. HOME CARE INSTRUCTIONS   Apply ice to the affected area for 15-20 minutes each hour while awake for 2 days. Put the ice in a plastic bag and place a towel between the bag of ice and your skin.  Rest the injured joint as much as possible, but continue to put the joint through a full range of motion, 4 times per day. (The shoulder joint especially becomes rapidly "frozen" if not used.) When the pain lessens, begin normal slow movements and usual activities.  Only take over-the-counter or prescription medicines for pain, discomfort or fever as directed by your caregiver.  Your caregiver may recommend draining the bursa and injecting medicine into the bursa. This may help the healing process.  Follow all instructions for follow-up with your caregiver. This includes any orthopedic referrals, physical therapy and rehabilitation. Any delay in obtaining necessary care could result in a delay or failure of the bursitis to heal and chronic pain. SEEK IMMEDIATE MEDICAL CARE IF:   Your pain increases even during treatment.  You develop an oral temperature above 102 F (38.9 C) and have heat and inflammation over the involved bursa. MAKE SURE YOU:   Understand these instructions.  Will watch your  condition.  Will get help right away if you are not doing well or get worse. Document Released: 01/13/2000 Document Revised: 04/09/2011 Document Reviewed: 04/06/2013 [facility] Patient Information 2015 [place], [place]. This information is not intended to replace advice given to you by your health care provider. Make sure you discuss any questions you have with your health care provider.

## 2013-12-31 NOTE — Progress Notes (Signed)
BP 164/94 mmHg  Pulse 92  Temp(Src) 97.8 F (36.6 C) (Oral)  Ht 6' (1.829 m)  Wt 223 lb 8 oz (101.379 kg)  BMI 30.31 kg/m2   CC: re establish/CPE  Subjective:    [name], male    DOB: 03-Mar-1955, 58 y.o.   MRN: [id number]  HPI: Andre [name] is a 58 y.o. male presenting on 12/31/2013 for Establish Care   Seen here 07/2010 with URI, prior to this last CPE by PCP Dr Council Mechanic was 03/2010. Presents to re establish care and for CPE.  HTN - prior on HCTZ, stopped this. Noticed some side effects including urinary frequency as well as weakness. Doesn't check BP at home.  HLD - off meds.  Swelling at L achilles tendon limits activity - prior was walking in park, no longer. Ongoing for last several weeks. Worse after prolonged inactivity. Never erythema or warmth, fever.  Preventative: COLONOSCOPY Date: 01/2012 tubular adenoma Ardis Hughs), rpt 5 yrs Prostate cancer screening - requests continued. Flu shot - declines Td 2007  Lives with wife, no pets Edu: 18 yr college Occupation: route salesman Activity: no regular exercise Diet: good water, fruits/vegetables daily  Relevant past medical, surgical, family and social history reviewed and updated as indicated. Interim medical history since our last visit reviewed. Allergies and medications reviewed and updated.  No current outpatient prescriptions on file prior to visit.   No current facility-administered medications on file prior to visit.    Review of Systems  Constitutional: Negative for fever, chills, activity change, appetite change, fatigue and unexpected weight change.  HENT: Negative for hearing loss.   Eyes: Negative for visual disturbance.  Respiratory: Negative for cough, chest tightness, shortness of breath and wheezing.   Cardiovascular: Negative for chest pain, palpitations and leg swelling.  Gastrointestinal: Negative for nausea, vomiting, abdominal pain, diarrhea, constipation, blood in stool and  abdominal distention.  Genitourinary: Negative for hematuria and difficulty urinating.  Musculoskeletal: Negative for myalgias, arthralgias and neck pain.  Skin: Negative for rash.  Neurological: Negative for dizziness, seizures, syncope and headaches.  Hematological: Negative for adenopathy. Does not bruise/bleed easily.  Psychiatric/Behavioral: Negative for dysphoric mood. The patient is not nervous/anxious.    Per HPI unless specifically indicated above     Objective:    BP 164/94 mmHg  Pulse 92  Temp(Src) 97.8 F (36.6 C) (Oral)  Ht 6' (1.829 m)  Wt 223 lb 8 oz (101.379 kg)  BMI 30.31 kg/m2  Wt Readings from Last 3 Encounters:  12/31/13 223 lb 8 oz (101.379 kg)  02/26/12 225 lb (102.059 kg)  02/01/12 225 lb 12.8 oz (102.422 kg)    Physical Exam  Constitutional: He is oriented to person, place, and time. He appears well-developed and well-nourished. No distress.  HENT:  Head: Normocephalic and atraumatic.  Right Ear: Hearing, tympanic membrane, external ear and ear canal normal.  Left Ear: Hearing, tympanic membrane, external ear and ear canal normal.  Nose: Nose normal.  Mouth/Throat: Uvula is midline, oropharynx is clear and moist and mucous membranes are normal. No oropharyngeal exudate, posterior oropharyngeal edema or posterior oropharyngeal erythema.  Eyes: Conjunctivae and EOM are normal. Pupils are equal, round, and reactive to light. No scleral icterus.  Neck: Normal range of motion. Neck supple. No thyromegaly present.  Cardiovascular: Normal rate, regular rhythm, normal heart sounds and intact distal pulses.   No murmur heard. Pulses:      Radial pulses are 2+ on the right side, and 2+ on the left  side.  Pulmonary/Chest: Effort normal and breath sounds normal. No respiratory distress. He has no wheezes. He has no rales.  Abdominal: Soft. Bowel sounds are normal. He exhibits no distension and no mass. There is no tenderness. There is no rebound and no guarding.    Genitourinary: Rectum normal and prostate normal. Rectal exam shows no external hemorrhoid, no internal hemorrhoid, no fissure, no mass, no tenderness and anal tone normal. Prostate is not enlarged (20gm) and not tender.  Musculoskeletal: Normal range of motion. He exhibits no edema.  Swelling and tender at left retrocalcaneal bursa superior to insertion of achilles tendon into calcaneus  Lymphadenopathy:    He has no cervical adenopathy.  Neurological: He is alert and oriented to person, place, and time.  CN grossly intact, station and gait intact  Skin: Skin is warm and dry. No rash noted.  Psychiatric: He has a normal mood and affect. His behavior is normal. Judgment and thought content normal.  Nursing note and vitals reviewed.      Assessment & Plan:   Problem List Items Addressed This Visit    Routine general medical examination at a health care facility - Primary    Preventative protocols reviewed and updated unless pt declined. Discussed healthy diet and lifestyle.     HYPERCHOLESTEROLEMIA    Pt will return fasting for labs next week.    Relevant Medications      amLODIpine (NORVASC) tablet   Other Relevant Orders      Lipid panel   Essential hypertension, benign    Too elevated today. Start amlodipine. Did not tolerate HCTZ. Consider EKG. Check labwork next week.    Relevant Medications      amLODIpine (NORVASC) tablet   Other Relevant Orders      Comprehensive metabolic panel   Achilles tendon pain    L sided. Consistent today with retrocalcaneal bursitis. Treat with compression, ice, heel lift, and aleve bid x 5-7 days with food then prn. Update if not improved with this. Pt agrees with plan.     Other Visit Diagnoses    Special screening for malignant neoplasm of prostate        Relevant Orders       PSA        Follow up plan: Return in about 3 months (around 04/01/2014), or as needed, for follow up visit.

## 2013-12-31 NOTE — Assessment & Plan Note (Signed)
Preventative protocols reviewed and updated unless pt declined. Discussed healthy diet and lifestyle.  

## 2013-12-31 NOTE — Assessment & Plan Note (Signed)
Pt will return fasting for labs next week.

## 2014-01-06 ENCOUNTER — Other Ambulatory Visit (INDEPENDENT_AMBULATORY_CARE_PROVIDER_SITE_OTHER): Payer: BC Managed Care – PPO

## 2014-01-06 DIAGNOSIS — I10 Essential (primary) hypertension: Secondary | ICD-10-CM

## 2014-01-06 DIAGNOSIS — E7800 Pure hypercholesterolemia, unspecified: Secondary | ICD-10-CM

## 2014-01-06 DIAGNOSIS — Z125 Encounter for screening for malignant neoplasm of prostate: Secondary | ICD-10-CM

## 2014-01-07 LAB — PSA: PSA: 0.92 ng/mL (ref 0.10–4.00)

## 2014-01-07 LAB — COMPREHENSIVE METABOLIC PANEL
ALT: 28 U/L (ref 0–53)
AST: 24 U/L (ref 0–37)
Albumin: 4.3 g/dL (ref 3.5–5.2)
Alkaline Phosphatase: 65 U/L (ref 39–117)
BUN: 12 mg/dL (ref 6–23)
CALCIUM: 9.2 mg/dL (ref 8.4–10.5)
CHLORIDE: 103 meq/L (ref 96–112)
CO2: 28 meq/L (ref 19–32)
Creatinine, Ser: 0.7 mg/dL (ref 0.4–1.5)
GFR: 125.96 mL/min (ref >60.00)
Glucose, Bld: 81 mg/dL (ref 70–99)
Potassium: 4.4 mEq/L (ref 3.5–5.1)
SODIUM: 138 meq/L (ref 135–145)
TOTAL PROTEIN: 6.4 g/dL (ref 6.0–8.3)
Total Bilirubin: 0.9 mg/dL (ref 0.2–1.2)

## 2014-01-07 LAB — LIPID PANEL
CHOL/HDL RATIO: 4
Cholesterol: 190 mg/dL (ref 0–200)
HDL: 52.3 mg/dL (ref >39.00)
LDL Cholesterol: 105 mg/dL — ABNORMAL HIGH (ref 0–99)
NONHDL: 137.7
Triglycerides: 163 mg/dL — ABNORMAL HIGH (ref 0.0–149.0)
VLDL: 32.6 mg/dL (ref 0.0–40.0)

## 2014-01-08 ENCOUNTER — Encounter: Payer: Self-pay | Admitting: *Deleted

## 2014-04-08 ENCOUNTER — Telehealth: Payer: Self-pay | Admitting: Family Medicine

## 2014-04-08 ENCOUNTER — Ambulatory Visit: Payer: BC Managed Care – PPO | Admitting: Family Medicine

## 2014-04-08 NOTE — Telephone Encounter (Signed)
Patient did not come for their scheduled appointment today. For 3 month follow up  Please let me know if the patient needs to be contacted immediately for follow up or if no follow up is necessary.

## 2014-04-08 NOTE — Telephone Encounter (Signed)
Pt needs f/u for BP monitoring.

## 2014-04-14 NOTE — Telephone Encounter (Signed)
Left message asking pt to call office  °

## 2014-04-16 NOTE — Telephone Encounter (Signed)
Left message asking pt to call office  °

## 2014-04-19 NOTE — Telephone Encounter (Signed)
Left message asking pt to call office  °

## 2014-04-26 NOTE — Telephone Encounter (Signed)
Appointment 4/3

## 2014-04-30 ENCOUNTER — Ambulatory Visit: Payer: Self-pay | Admitting: Family Medicine

## 2014-05-03 ENCOUNTER — Encounter: Payer: Self-pay | Admitting: Family Medicine

## 2014-05-03 ENCOUNTER — Ambulatory Visit (INDEPENDENT_AMBULATORY_CARE_PROVIDER_SITE_OTHER): Payer: BLUE CROSS/BLUE SHIELD | Admitting: Family Medicine

## 2014-05-03 VITALS — BP 130/80 | HR 96 | Temp 98.1°F | Wt 219.2 lb

## 2014-05-03 DIAGNOSIS — I10 Essential (primary) hypertension: Secondary | ICD-10-CM | POA: Diagnosis not present

## 2014-05-03 DIAGNOSIS — R310 Gross hematuria: Secondary | ICD-10-CM | POA: Insufficient documentation

## 2014-05-03 DIAGNOSIS — R319 Hematuria, unspecified: Secondary | ICD-10-CM

## 2014-05-03 LAB — POCT URINALYSIS DIPSTICK
Bilirubin, UA: NEGATIVE
Blood, UA: NEGATIVE
Glucose, UA: NEGATIVE
KETONES UA: NEGATIVE
Leukocytes, UA: NEGATIVE
Nitrite, UA: NEGATIVE
PH UA: 5.5
Protein, UA: NEGATIVE
Spec Grav, UA: 1.025
Urobilinogen, UA: 0.2

## 2014-05-03 MED ORDER — AMLODIPINE BESYLATE 5 MG PO TABS: 5.0000 mg | ORAL_TABLET | Freq: Every day | ORAL | Status: DC

## 2014-05-03 NOTE — Patient Instructions (Addendum)
Urine check today. EKG today - we will call you with results. You are doing great with amlodipine - continue med. If everything looking good - return after 12/3 for next physical.   DASH Eating Plan DASH stands for "Dietary Approaches to Stop Hypertension." The DASH eating plan is a healthy eating plan that has been shown to reduce high blood pressure (hypertension). Additional health benefits may include reducing the risk of type 2 diabetes mellitus, heart disease, and stroke. The DASH eating plan may also help with weight loss. WHAT DO I NEED TO KNOW ABOUT THE DASH EATING PLAN? For the DASH eating plan, you will follow these general guidelines:  Choose foods with a percent daily value for sodium of less than 5% (as listed on the food label).  Use salt-free seasonings or herbs instead of table salt or sea salt.  Check with your health care provider or pharmacist before using salt substitutes.  Eat lower-sodium products, often labeled as "lower sodium" or "no salt added."  Eat fresh foods.  Eat more vegetables, fruits, and low-fat dairy products.  Choose whole grains. Look for the word "whole" as the first word in the ingredient list.  Choose fish and skinless chicken or [place] more often than red meat. Limit fish, poultry, and meat to 6 oz (170 g) each day.  Limit sweets, desserts, sugars, and sugary drinks.  Choose heart-healthy fats.  Limit cheese to 1 oz (28 g) per day.  Eat more home-cooked food and less restaurant, buffet, and fast food.  Limit fried foods.  Cook foods using methods other than frying.  Limit canned vegetables. If you do use them, rinse them well to decrease the sodium.  When eating at a restaurant, ask that your food be prepared with less salt, or no salt if possible. WHAT FOODS CAN I EAT? Seek help from a dietitian for individual calorie needs. Grains Whole grain or whole wheat bread. Brown rice. Whole grain or whole wheat pasta. Quinoa, bulgur, and  whole grain cereals. Low-sodium cereals. Corn or whole wheat flour tortillas. Whole grain cornbread. Whole grain crackers. Low-sodium crackers. Vegetables Fresh or frozen vegetables (raw, steamed, roasted, or grilled). Low-sodium or reduced-sodium tomato and vegetable juices. Low-sodium or reduced-sodium tomato sauce and paste. Low-sodium or reduced-sodium canned vegetables.  Fruits All fresh, canned (in natural juice), or frozen fruits. Meat and Other Protein Products Ground beef (85% or leaner), grass-fed beef, or beef trimmed of fat. Skinless chicken or [place]. Ground chicken or [place]. Pork trimmed of fat. All fish and seafood. Eggs. Dried beans, peas, or lentils. Unsalted nuts and seeds. Unsalted canned beans. Dairy Low-fat dairy products, such as skim or 1% milk, 2% or reduced-fat cheeses, low-fat ricotta or cottage cheese, or plain low-fat yogurt. Low-sodium or reduced-sodium cheeses. Fats and Oils Tub margarines without trans fats. Light or reduced-fat mayonnaise and salad dressings (reduced sodium). Avocado. Safflower, olive, or canola oils. Natural peanut or almond butter. Other Unsalted popcorn and pretzels. The items listed above may not be a complete list of recommended foods or beverages. Contact your dietitian for more options. WHAT FOODS ARE NOT RECOMMENDED? Grains White bread. White pasta. White rice. Refined cornbread. Bagels and croissants. Crackers that contain trans fat. Vegetables Creamed or fried vegetables. Vegetables in a cheese sauce. Regular canned vegetables. Regular canned tomato sauce and paste. Regular tomato and vegetable juices. Fruits Dried fruits. Canned fruit in light or heavy syrup. Fruit juice. Meat and Other Protein Products Fatty cuts of meat. Ribs, chicken wings, bacon, sausage,  bologna, salami, chitterlings, fatback, hot dogs, bratwurst, and packaged luncheon meats. Salted nuts and seeds. Canned beans with salt. Dairy Whole or 2% milk, cream,  half-and-half, and cream cheese. Whole-fat or sweetened yogurt. Full-fat cheeses or blue cheese. Nondairy creamers and whipped toppings. Processed cheese, cheese spreads, or cheese curds. Condiments Onion and garlic salt, seasoned salt, table salt, and sea salt. Canned and packaged gravies. Worcestershire sauce. Tartar sauce. Barbecue sauce. Teriyaki sauce. Soy sauce, including reduced sodium. Steak sauce. Fish sauce. Oyster sauce. Cocktail sauce. Horseradish. Ketchup and mustard. Meat flavorings and tenderizers. Bouillon cubes. Hot sauce. Tabasco sauce. Marinades. Taco seasonings. Relishes. Fats and Oils Butter, stick margarine, lard, shortening, ghee, and bacon fat. Coconut, palm kernel, or palm oils. Regular salad dressings. Other Pickles and olives. Salted popcorn and pretzels. The items listed above may not be a complete list of foods and beverages to avoid. Contact your dietitian for more information. WHERE CAN I FIND MORE INFORMATION? National Heart, Lung, and Blood Institute: travelstabloid.com Document Released: 01/04/2011 Document Revised: 06/01/2013 Document Reviewed: 11/19/2012 [facility] Patient Information 2015 [place], [place]. This information is not intended to replace advice given to you by your health care provider. Make sure you discuss any questions you have with your health care provider.

## 2014-05-03 NOTE — Assessment & Plan Note (Addendum)
Continue amlodipine 5mg  daily - tolerating very well without pedal edema. Baseline EKG today - NSR rate 80, normal axis, intervals, no acute ST/T changes.Marland Kitchen

## 2014-05-03 NOTE — Progress Notes (Signed)
BP 130/80 mmHg  Pulse 96  Temp(Src) 98.1 F (36.7 C) (Oral)  Wt 219 lb 4 oz (99.451 kg)   CC: 4 mo f/u visit  Subjective:    [name], male    DOB: 1956/01/17, 59 y.o.   MRN: [id number]  HPI: Andre [name] is a 59 y.o. male presenting on 05/03/2014 for Follow-up   HTN - Compliant with current antihypertensive regimen of amlodipine 5mg  daily.  Does not check blood pressures at home.  No low blood pressure symptoms of dizziness/syncope.  Denies HA, vision changes, CP/tightness, SOB, leg swelling.   Had DOT 01/2014 - moderate blood in urine. Never smoker. No fmhx GU cancer. No paint fume exposure.  Good diet - water, salads, fish. Moderate red meat.   Relevant past medical, surgical, family and social history reviewed and updated as indicated. Interim medical history since our last visit reviewed. Allergies and medications reviewed and updated. Current Outpatient Prescriptions on File Prior to Visit  Medication Sig  . Multiple Vitamins-Minerals (MULTIVITAMIN & MINERAL PO) Take 1 tablet by mouth daily.  . NON FORMULARY Pressure Low natural supplement for blood pressure   No current facility-administered medications on file prior to visit.    Review of Systems Per HPI unless specifically indicated above     Objective:    BP 130/80 mmHg  Pulse 96  Temp(Src) 98.1 F (36.7 C) (Oral)  Wt 219 lb 4 oz (99.451 kg)  Wt Readings from Last 3 Encounters:  05/03/14 219 lb 4 oz (99.451 kg)  12/31/13 223 lb 8 oz (101.379 kg)  02/26/12 225 lb (102.059 kg)    Physical Exam  Constitutional: He appears well-developed and well-nourished. No distress.  HENT:  Mouth/Throat: Oropharynx is clear and moist. No oropharyngeal exudate.  Cardiovascular: Normal rate, regular rhythm, normal heart sounds and intact distal pulses.   No murmur heard. Pulmonary/Chest: Effort normal and breath sounds normal. No respiratory distress. He has no wheezes. He has no rales.    Musculoskeletal: He exhibits no edema.  Skin: Skin is warm and dry. No rash noted.  Psychiatric: He has a normal mood and affect.  Nursing note and vitals reviewed.  Results for orders placed or performed in visit on 01/06/14  Lipid panel  Result Value Ref Range   Cholesterol 190 0 - 200 mg/dL   Triglycerides 163.0 (H) 0.0 - 149.0 mg/dL   HDL 52.30 >39.00 mg/dL   VLDL 32.6 0.0 - 40.0 mg/dL   LDL Cholesterol 105 (H) 0 - 99 mg/dL   Total CHOL/HDL Ratio 4    NonHDL 137.70   Comprehensive metabolic panel  Result Value Ref Range   Sodium 138 135 - 145 mEq/L   Potassium 4.4 3.5 - 5.1 mEq/L   Chloride 103 96 - 112 mEq/L   CO2 28 19 - 32 mEq/L   Glucose, Bld 81 70 - 99 mg/dL   BUN 12 6 - 23 mg/dL   Creatinine, Ser 0.7 0.4 - 1.5 mg/dL   Total Bilirubin 0.9 0.2 - 1.2 mg/dL   Alkaline Phosphatase 65 39 - 117 U/L   AST 24 0 - 37 U/L   ALT 28 0 - 53 U/L   Total Protein 6.4 6.0 - 8.3 g/dL   Albumin 4.3 3.5 - 5.2 g/dL   Calcium 9.2 8.4 - 10.5 mg/dL   GFR 125.96 >60.00 mL/min  PSA  Result Value Ref Range   PSA 0.92 0.10 - 4.00 ng/mL      Assessment & Plan:  Problem List Items Addressed This Visit    Hematuria    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      Relevant Orders   POCT Urinalysis Dipstick   Essential hypertension, benign - Primary    Continue amlodipine 5mg  daily - tolerating very well without pedal edema. Baseline EKG today - NSR rate 80, normal axis, intervals, no acute ST/T changes..      Relevant Medications   amLODIpine (NORVASC) tablet   Other Relevant Orders   EKG 12-Lead (Completed)       Follow up plan: Return in about 9 months (around 02/02/2015), or if symptoms worsen or fail to improve, for annual exam, prior fasting for blood work.

## 2014-05-03 NOTE — Assessment & Plan Note (Signed)
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

## 2014-05-03 NOTE — Progress Notes (Signed)
Pre visit review using our clinic review tool, if applicable. No additional management support is needed unless otherwise documented below in the visit note. 

## 2014-06-05 ENCOUNTER — Other Ambulatory Visit: Payer: Self-pay | Admitting: Family Medicine

## 2014-06-30 DIAGNOSIS — N200 Calculus of kidney: Secondary | ICD-10-CM

## 2014-06-30 HISTORY — DX: Calculus of kidney: N20.0

## 2014-07-18 ENCOUNTER — Encounter (HOSPITAL_BASED_OUTPATIENT_CLINIC_OR_DEPARTMENT_OTHER): Payer: Self-pay

## 2014-07-18 ENCOUNTER — Emergency Department (HOSPITAL_BASED_OUTPATIENT_CLINIC_OR_DEPARTMENT_OTHER): Payer: BLUE CROSS/BLUE SHIELD

## 2014-07-18 ENCOUNTER — Emergency Department (HOSPITAL_BASED_OUTPATIENT_CLINIC_OR_DEPARTMENT_OTHER)
Admission: EM | Admit: 2014-07-18 | Discharge: 2014-07-18 | Disposition: A | Discharge status: Home / Self Care | Payer: BLUE CROSS/BLUE SHIELD | Attending: Emergency Medicine | Admitting: Emergency Medicine

## 2014-07-18 DIAGNOSIS — I10 Essential (primary) hypertension: Secondary | ICD-10-CM | POA: Insufficient documentation

## 2014-07-18 DIAGNOSIS — R1032 Left lower quadrant pain: Secondary | ICD-10-CM | POA: Diagnosis present

## 2014-07-18 DIAGNOSIS — Z8639 Personal history of other endocrine, nutritional and metabolic disease: Secondary | ICD-10-CM | POA: Insufficient documentation

## 2014-07-18 DIAGNOSIS — N200 Calculus of kidney: Secondary | ICD-10-CM

## 2014-07-18 DIAGNOSIS — Z79899 Other long term (current) drug therapy: Secondary | ICD-10-CM | POA: Insufficient documentation

## 2014-07-18 DIAGNOSIS — Z86010 Personal history of colonic polyps: Secondary | ICD-10-CM | POA: Diagnosis not present

## 2014-07-18 LAB — URINE MICROSCOPIC-ADD ON

## 2014-07-18 LAB — URINALYSIS, ROUTINE W REFLEX MICROSCOPIC
BILIRUBIN URINE: NEGATIVE
Glucose, UA: NEGATIVE mg/dL
Ketones, ur: NEGATIVE mg/dL
LEUKOCYTES UA: NEGATIVE
Nitrite: NEGATIVE
Protein, ur: NEGATIVE mg/dL
SPECIFIC GRAVITY, URINE: 1.014 (ref 1.005–1.030)
Urobilinogen, UA: 0.2 mg/dL (ref 0.0–1.0)
pH: 7.5 (ref 5.0–8.0)

## 2014-07-18 MED ORDER — OXYCODONE-ACETAMINOPHEN 5-325 MG PO TABS: 1.0000 | ORAL_TABLET | Freq: Four times a day (QID) | ORAL | Status: DC | PRN

## 2014-07-18 MED ORDER — KETOROLAC TROMETHAMINE 60 MG/2ML IM SOLN
60.0000 mg | Freq: Once | INTRAMUSCULAR | Status: AC
  Administered 2014-07-18: 60 mg via INTRAMUSCULAR
  Filled 2014-07-18: qty 2

## 2014-07-18 NOTE — ED Notes (Signed)
Strainer given to patient at d/c home.

## 2014-07-18 NOTE — ED Notes (Signed)
Pt reports LLQ pain x1 day with constant urination.

## 2014-07-18 NOTE — ED Notes (Signed)
MD with pt  

## 2014-07-18 NOTE — Discharge Instructions (Signed)
Percocet as prescribed as needed for pain.  Follow-up with [facility] if your symptoms are not improving in the next 2 days. The contact information has been provided in this discharge summary.  Return to the emergency department for worsening pain, high fever, or other new and concerning symptoms.   Kidney Stones Kidney stones (urolithiasis) are deposits that form inside your kidneys. The intense pain is caused by the stone moving through the urinary tract. When the stone moves, the ureter goes into spasm around the stone. The stone is usually passed in the urine.  CAUSES   A disorder that makes certain neck glands produce too much parathyroid hormone (primary hyperparathyroidism).  A buildup of uric acid crystals, similar to gout in your joints.  Narrowing (stricture) of the ureter.  A kidney obstruction present at birth (congenital obstruction).  Previous surgery on the kidney or ureters.  Numerous kidney infections. SYMPTOMS   Feeling sick to your stomach (nauseous).  Throwing up (vomiting).  Blood in the urine (hematuria).  Pain that usually spreads (radiates) to the groin.  Frequency or urgency of urination. DIAGNOSIS   Taking a history and physical exam.  Blood or urine tests.  CT scan.  Occasionally, an examination of the inside of the urinary bladder (cystoscopy) is performed. TREATMENT   Observation.  Increasing your fluid intake.  Extracorporeal shock wave lithotripsy--This is a noninvasive procedure that uses shock waves to break up kidney stones.  Surgery may be needed if you have severe pain or persistent obstruction. There are various surgical procedures. Most of the procedures are performed with the use of small instruments. Only small incisions are needed to accommodate these instruments, so recovery time is minimized. The size, location, and chemical composition are all important variables that will determine the proper choice of action for  you. Talk to your health care provider to better understand your situation so that you will minimize the risk of injury to yourself and your kidney.  HOME CARE INSTRUCTIONS   Drink enough water and fluids to keep your urine clear or pale yellow. This will help you to pass the stone or stone fragments.  Strain all urine through the provided strainer. Keep all particulate matter and stones for your health care provider to see. The stone causing the pain may be as small as a grain of salt. It is very important to use the strainer each and every time you pass your urine. The collection of your stone will allow your health care provider to analyze it and verify that a stone has actually passed. The stone analysis will often identify what you can do to reduce the incidence of recurrences.  Only take over-the-counter or prescription medicines for pain, discomfort, or fever as directed by your health care provider.  Make a follow-up appointment with your health care provider as directed.  Get follow-up X-rays if required. The absence of pain does not always mean that the stone has passed. It may have only stopped moving. If the urine remains completely obstructed, it can cause loss of kidney function or even complete destruction of the kidney. It is your responsibility to make sure X-rays and follow-ups are completed. Ultrasounds of the kidney can show blockages and the status of the kidney. Ultrasounds are not associated with any radiation and can be performed easily in a matter of minutes. SEEK MEDICAL CARE IF:  You experience pain that is progressive and unresponsive to any pain medicine you have been prescribed. SEEK IMMEDIATE MEDICAL CARE IF:  Pain cannot be controlled with the prescribed medicine.  You have a fever or shaking chills.  The severity or intensity of pain increases over 18 hours and is not relieved by pain medicine.  You develop a new onset of abdominal pain.  You feel faint or  pass out.  You are unable to urinate. MAKE SURE YOU:   Understand these instructions.  Will watch your condition.  Will get help right away if you are not doing well or get worse. Document Released: 01/15/2005 Document Revised: 09/17/2012 Document Reviewed: 06/18/2012 [facility] Patient Information 2015 [place], [place]. This information is not intended to replace advice given to you by your health care provider. Make sure you discuss any questions you have with your health care provider.

## 2014-07-18 NOTE — ED Provider Notes (Signed)
CSN: [id number]     Arrival date & time 07/18/14  0135 History   First MD Initiated Contact with Patient 07/18/14 0222     Chief Complaint  Patient presents with  . Abdominal Pain     (Consider location/radiation/quality/duration/timing/severity/associated sxs/prior Treatment) HPI Comments: Patient is a 59 year old male with history of hypertension. He presents for evaluation of left lower quadrant abdominal pain. He started earlier yesterday with complaints of urinary frequency and burning. He is now having discomfort in the left lower and left mid abdomen. He feels fevered, however is afebrile here in the emergency department. He denies any bowel complaints.  Patient is a 59 y.o. male presenting with abdominal pain. The history is provided by the patient.  Abdominal Pain Pain location:  LLQ Pain quality: cramping   Pain radiates to:  Does not radiate Pain severity:  Moderate Onset quality:  Sudden Duration:  12 hours Timing:  Constant Progression:  Worsening Chronicity:  New Relieved by:  Nothing Worsened by:  Palpation and movement Ineffective treatments:  None tried Associated symptoms: fever     Past Medical History  Diagnosis Date  . Essential hypertension, benign   . Pure hypercholesterolemia   . History of colon polyps 2014   Past Surgical History  Procedure Laterality Date  . Tonsillectomy      as a child  . Colonoscopy  01/2012    tubular adenoma Ardis Hughs), rpt 5 yrs   Family History  Problem Relation Age of Onset  . Cancer Sister 78    colon  . Cancer Sister 65    breast  . Stomach cancer Neg Hx   . CAD Neg Hx   . Stroke Neg Hx   . Diabetes Neg Hx   . Hypertension Mother    History  Substance Use Topics  . Smoking status: Never Smoker   . Smokeless tobacco: Never Used  . Alcohol Use: No    Review of Systems  Constitutional: Positive for fever.  Gastrointestinal: Positive for abdominal pain.  All other systems reviewed and are  negative.     Allergies  Review of patient's allergies indicates no known allergies.  Home Medications   Prior to Admission medications   Medication Sig Start Date End Date Taking? Authorizing Provider  amLODipine (NORVASC) 5 MG tablet Take 1 tablet (5 mg total) by mouth daily. 05/03/14  Yes Ria Bush, MD  Multiple Vitamins-Minerals (MULTIVITAMIN & MINERAL PO) Take 1 tablet by mouth daily.   Yes Historical Provider, MD  NON FORMULARY Pressure Low natural supplement for blood pressure    Historical Provider, MD   BP 143/96 mmHg  Pulse 84  Temp(Src) 97.8 F (36.6 C) (Oral)  Resp 16  Ht 6' (1.829 m)  Wt 240 lb (108.863 kg)  BMI 32.54 kg/m2  SpO2 99% Physical Exam  Constitutional: He is oriented to person, place, and time. He appears well-developed and well-nourished. No distress.  HENT:  Head: Normocephalic and atraumatic.  Neck: Normal range of motion. Neck supple.  Cardiovascular: Normal rate, regular rhythm and normal heart sounds.   No murmur heard. Pulmonary/Chest: Breath sounds normal. No respiratory distress. He has no wheezes.  Abdominal: Soft. Bowel sounds are normal. He exhibits no distension. There is tenderness. There is no rebound and no guarding.  There is tenderness to palpation in the left lower quadrant and left mid abdomen.  Musculoskeletal: Normal range of motion. He exhibits no edema.  Neurological: He is alert and oriented to person, place, and time.  Skin:  Skin is warm and dry. He is not diaphoretic.  Nursing note and vitals reviewed.   ED Course  Procedures (including critical care time) Labs Review Labs Reviewed  URINALYSIS, ROUTINE W REFLEX MICROSCOPIC (NOT AT [facility])    Imaging Review No results found.   EKG Interpretation None      MDM   Final diagnoses:  None    CT scan shows an obstructing 2 mm stone in the distal UVJ. He is feeling markedly improved with IM Toradol and will be discharged with Percocet and when necessary  follow-up with urology. He does have a larger nonobstructing stone in the kidney itself which may need attention down the road.    Veryl Speak, MD 07/18/14 [id number]

## 2014-07-18 NOTE — ED Notes (Signed)
Patient transported to CT 

## 2014-07-19 ENCOUNTER — Encounter: Payer: Self-pay | Admitting: Family Medicine

## 2014-12-13 ENCOUNTER — Encounter: Payer: Self-pay | Admitting: Family Medicine

## 2014-12-13 ENCOUNTER — Ambulatory Visit (INDEPENDENT_AMBULATORY_CARE_PROVIDER_SITE_OTHER): Payer: BLUE CROSS/BLUE SHIELD | Admitting: Family Medicine

## 2014-12-13 VITALS — BP 130/80 | HR 84 | Temp 98.3°F | Wt 219.5 lb

## 2014-12-13 DIAGNOSIS — R310 Gross hematuria: Secondary | ICD-10-CM | POA: Diagnosis not present

## 2014-12-13 DIAGNOSIS — L720 Epidermal cyst: Secondary | ICD-10-CM | POA: Insufficient documentation

## 2014-12-13 LAB — POCT URINALYSIS DIPSTICK
Bilirubin, UA: NEGATIVE
GLUCOSE UA: NEGATIVE
Ketones, UA: NEGATIVE
Leukocytes, UA: NEGATIVE
Nitrite, UA: NEGATIVE
PROTEIN UA: NEGATIVE
RBC UA: NEGATIVE
Urobilinogen, UA: 0.2
pH, UA: 6

## 2014-12-13 NOTE — Progress Notes (Signed)
   BP 130/80 mmHg  Pulse 84  Temp(Src) 98.3 F (36.8 C) (Oral)  Wt 219 lb 8 oz (99.565 kg)   CC: check cysts on back  Subjective:    [name], male    DOB: 1955-05-16, 59 y.o.   MRN: [id number]  HPI: Andre [name] is a 59 y.o. male presenting on 12/13/2014 for Cysts   Would like cysts on back evaluated.  No pain, soreness, itching.  Some redness around cyst. Wife concerned.   ER eval 06/2014 for kidney stones. Pain resolved but never passed kidney stone. He also has 2nd 59mm non-obstructing left kidney stone.  One episode of painless gross hematuria 08/2014. No preceding unusual exertion. No smoking history. No fmhx bladder or kidney cancer.  Saw urologist Dr Louis Meckel.   Relevant past medical, surgical, family and social history reviewed and updated as indicated. Interim medical history since our last visit reviewed. Allergies and medications reviewed and updated. Current Outpatient Prescriptions on File Prior to Visit  Medication Sig  . amLODipine (NORVASC) 5 MG tablet Take 1 tablet (5 mg total) by mouth daily.  . Multiple Vitamins-Minerals (MULTIVITAMIN & MINERAL PO) Take 1 tablet by mouth daily.   No current facility-administered medications on file prior to visit.    Review of Systems Per HPI unless specifically indicated in ROS section     Objective:    BP 130/80 mmHg  Pulse 84  Temp(Src) 98.3 F (36.8 C) (Oral)  Wt 219 lb 8 oz (99.565 kg)  Wt Readings from Last 3 Encounters:  12/13/14 219 lb 8 oz (99.565 kg)  07/18/14 240 lb (108.863 kg)  05/03/14 219 lb 4 oz (99.451 kg)    Physical Exam  Constitutional: He appears well-developed and well-nourished. No distress.  Abdominal: Soft. Normal appearance and bowel sounds are normal. He exhibits no distension and no mass. There is no hepatosplenomegaly. There is no tenderness. There is no rigidity, no rebound, no guarding, no CVA tenderness and negative Murphy's sign.  Skin: Skin is warm and dry. No  rash noted. No erythema.  Small midline lower cervical spine epidermal cyst of skin. Moderate left lower back epidermal cyst of skin. Both with small central opening  Nursing note and vitals reviewed.      Assessment & Plan:   Problem List Items Addressed This Visit    Gross hematuria - Primary    Isolated episode of painless gross hematuria, in setting of known kidney stones. However given painless episode will check with urology to see if any further evaluation indicated. Pt agrees with plan.      Epidermal inclusion cyst    Discussed, reassured. Advised update [place] if desires removal for derm referral, or return if acutely painful/infected for I&D. Pt/wife agree.          Follow up plan: Return if symptoms worsen or fail to improve.

## 2014-12-13 NOTE — Progress Notes (Signed)
Pre visit review using our clinic review tool, if applicable. No additional management support is needed unless otherwise documented below in the visit note. 

## 2014-12-13 NOTE — Assessment & Plan Note (Signed)
Discussed, reassured. Advised update [place] if desires removal for derm referral, or return if acutely painful/infected for I&D. Pt/wife agree.

## 2014-12-13 NOTE — Addendum Note (Signed)
Addended by: Royann Shivers A on: 12/13/2014 01:52 PM   Modules accepted: Orders

## 2014-12-13 NOTE — Assessment & Plan Note (Signed)
Isolated episode of painless gross hematuria, in setting of known kidney stones. However given painless episode will check with urology to see if any further evaluation indicated. Pt agrees with plan.

## 2014-12-13 NOTE — Patient Instructions (Addendum)
Check urinalysis today for gross hematuria.  You have epidermal inclusion cysts - we can monitor and let us know if they become bothersome for dermatologist referral.   Epidermal Cyst An epidermal cyst is sometimes called a sebaceous cyst, epidermal inclusion cyst, or infundibular cyst. These cysts usually contain a substance that looks "pasty" or "cheesy" and may have a bad smell. This substance is a protein called keratin. Epidermal cysts are usually found on the face, neck, or trunk. They may also occur in the vaginal area or other parts of the genitalia of both men and women. Epidermal cysts are usually small, painless, slow-growing bumps or lumps that move freely under the skin. It is important not to try to pop them. This may cause an infection and lead to tenderness and swelling. CAUSES  Epidermal cysts may be caused by a deep penetrating injury to the skin or a plugged hair follicle, often associated with acne. SYMPTOMS  Epidermal cysts can become inflamed and cause:  Redness.  Tenderness.  Increased temperature of the skin over the bumps or lumps.  Grayish-white, bad smelling material that drains from the bump or lump. DIAGNOSIS  Epidermal cysts are easily diagnosed by your caregiver during an exam. Rarely, a tissue sample (biopsy) may be taken to rule out other conditions that may resemble epidermal cysts. TREATMENT   Epidermal cysts often get better and disappear on their own. They are rarely ever cancerous.  If a cyst becomes infected, it may become inflamed and tender. This may require opening and draining the cyst. Treatment with antibiotics may be necessary. When the infection is gone, the cyst may be removed with minor surgery.  Small, inflamed cysts can often be treated with antibiotics or by injecting steroid medicines.  Sometimes, epidermal cysts become large and bothersome. If this happens, surgical removal in your caregiver's office may be necessary. HOME CARE  INSTRUCTIONS  Only take over-the-counter or prescription medicines as directed by your caregiver.  Take your antibiotics as directed. Finish them even if you start to feel better. SEEK MEDICAL CARE IF:   Your cyst becomes tender, red, or swollen.  Your condition is not improving or is getting worse.  You have any other questions or concerns. MAKE SURE YOU:  Understand these instructions.  Will watch your condition.  Will get help right away if you are not doing well or get worse.   This information is not intended to replace advice given to you by your health care provider. Make sure you discuss any questions you have with your health care provider.   Document Released: 12/17/2003 Document Revised: 04/09/2011 Document Reviewed: 07/24/2010 Elsevier Interactive Patient Education [employer].

## 2014-12-28 ENCOUNTER — Other Ambulatory Visit: Payer: Self-pay | Admitting: Family Medicine

## 2014-12-28 DIAGNOSIS — R310 Gross hematuria: Secondary | ICD-10-CM

## 2015-03-21 ENCOUNTER — Encounter: Payer: Self-pay | Admitting: Family Medicine

## 2015-03-21 ENCOUNTER — Ambulatory Visit (INDEPENDENT_AMBULATORY_CARE_PROVIDER_SITE_OTHER): Payer: BLUE CROSS/BLUE SHIELD | Admitting: Family Medicine

## 2015-03-21 VITALS — BP 120/72 | HR 86 | Temp 98.7°F | Ht 72.0 in | Wt 217.2 lb

## 2015-03-21 DIAGNOSIS — R6889 Other general symptoms and signs: Secondary | ICD-10-CM

## 2015-03-21 NOTE — Progress Notes (Signed)
Pre visit review using our clinic review tool, if applicable. No additional management support is needed unless otherwise documented below in the visit note. 

## 2015-03-21 NOTE — Progress Notes (Signed)
Dr. Frederico Hamman T. Jamilett Ferrante, MD, [facility] [place], [address] Phone: [phone] Fax: [phone]  03/21/2015  Patient: Andre [name], MRN: [id number], DOB: 11-Feb-1955, 60 y.o.  Primary Physician:  Ria Bush, MD   Chief Complaint  Patient presents with  . Cough  . Nasal Congestion  . Fever  . Chills  . Sore Throat   Subjective:   Andre [name] presents with runny nose, sneezing, cough, sore throat, malaise, myalgias, arthralgia, chills, and fever. Cough, started with a sore throat, chills and fever, some congestion. Dry cough.  Chills - normal  Now Higher at night.  + recent exposure to others with similar symptoms.   The patent denies sore throat as the primary complaint. Denies sthortness of breath/wheezing, otalgia, facial pain, abdominal pain, changes in bowel or bladder.  Generally feels terrible  Tmax: ?  PMH, PHS, Allergies, Problem List, Medications, Family History, and Social History have all been reviewed.  Patient Active Problem List   Diagnosis Date Noted  . Epidermal inclusion cyst 12/13/2014  . Gross hematuria 05/03/2014  . Achilles tendon pain 12/31/2013  . Rosacea 04/20/2010  . Routine general medical examination at a health care facility 04/20/2010  . HYPERGLYCEMIA 09/06/2008  . EPIDERMOID CYST 04/05/2008  . Essential hypertension, benign 12/12/2006  . HYPERCHOLESTEROLEMIA 11/25/2006    Past Medical History  Diagnosis Date  . Essential hypertension, benign   . Pure hypercholesterolemia   . History of colon polyps 2014  . Nephrolithiasis 06/2014    ER visit    Past Surgical History  Procedure Laterality Date  . Tonsillectomy      as a child  . Colonoscopy  01/2012    tubular adenoma Ardis Hughs), rpt 5 yrs    Social History   Social History  . Marital Status: Married    Spouse Name: N/A  . Number of Children: 2  . Years of Education: N/A   Occupational History    . Sales and part delivery/auto    Social History Main Topics  . Smoking status: Never Smoker   . Smokeless tobacco: Never Used  . Alcohol Use: No  . Drug Use: No  . Sexual Activity: Yes   Other Topics Concern  . Not on file   Social History Narrative   Lives with wife, no pets   Edu: 88 yr college   Occupation: route salesman   Activity: no regular exercise   Diet: good water, fruits/vegetables daily    Family History  Problem Relation Age of Onset  . Cancer Sister 51    colon  . Cancer Sister 42    breast  . Stomach cancer Neg Hx   . CAD Neg Hx   . Stroke Neg Hx   . Diabetes Neg Hx   . Hypertension Mother     No Known Allergies  Medication list reviewed and updated in full in [place].  ROS as above, eating and drinking - tolerating PO. Urinating normally. No excessive vomitting or diarrhea. O/w as above.  Objective:   Blood pressure 120/72, pulse 86, temperature 98.7 F (37.1 C), temperature source Oral, height 6' (1.829 m), weight 217 lb 4 oz (98.544 kg).  Gen: WDWN, NAD; A & O x3, cooperative. Pleasant.Globally Non-toxic HEENT: Normocephalic and atraumatic. Throat clear, w/o exudate, R TM clear, L TM - good landmarks, No fluid present. rhinnorhea. No frontal or maxillary sinus T. MMM NECK: Anterior cervical  LAD is absent CV: RRR,  No M/G/R, cap refill <2 sec PULM: Breathing comfortably in no respiratory distress. no wheezing, crackles, rhonchi ABD: S,NT,ND,+BS. No HSM. No rebound. EXT: No c/c/e PSYCH: Friendly, good eye contact MSK: Nml gait   Assessment and Plan:   Flu-like symptoms   Supportive care, fluids, cough medicines as needed, and anti-pyretics. Infection control emphasized, including OOW or school until AF 24 hours.  Follow-up: No Follow-up on file.  Signed,  Maud Deed. Carolyne Whitsel, MD   Patient's Medications  New Prescriptions   No medications on file  Previous Medications   AMLODIPINE (NORVASC) 5 MG TABLET    Take 1 tablet  (5 mg total) by mouth daily.   MULTIPLE VITAMINS-MINERALS (MULTIVITAMIN & MINERAL PO)    Take 1 tablet by mouth daily.  Modified Medications   No medications on file  Discontinued Medications   No medications on file

## 2015-03-22 ENCOUNTER — Telehealth: Payer: Self-pay | Admitting: Family Medicine

## 2015-03-22 NOTE — Telephone Encounter (Signed)
Patient Name: Andre [name]  Gender: Male  DOB: [birth year]   Age: 60 Y 110 M 7 D  Return Phone Number: [phone] (Primary), [phone] (Secondary)  Address:   [place] [address]   Client [facility] - Client  Client Site [place] - Day  Physician Ria Bush   Contact Type Call  Who Is Calling Patient / Member / Family / Caregiver  Call Type Triage / Clinical  Caller Name [place]on Revdron  Relationship To Patient Spouse  Return Phone Number [phone] (Primary)  Chief Complaint Headache  Reason for Call Symptomatic / Request for Health Information  Initial Comment caller states that her husband was diagnosed with the flu. She feels that he has gotten worse, husband says his head feels painful and feels very weak. Having trouble breathing. dry cough.  Appointment Disposition EMR Appointment Scheduled  Info pasted into Epic Yes  PreDisposition Call Doctor  Translation No   Nurse Assessment  Nurse: Thad Ranger, RN, Langley Gauss Date/Time (Eastern Time): 03/22/2015 3:41:37 PM  Confirm and document reason for call. If symptomatic, describe symptoms. You must click the next button to save text entered. ---caller states that her husband was diagnosed with the flu at [facility] yesterday. He did not get any meds. She feels that he has gotten worse, husband says his head feels painful and feels very weak. Having trouble breathing through his nose and a cough.  Has the patient traveled out of the country within the last 30 days? ---Not Applicable  Does the patient have any new or worsening symptoms? ---Yes  Will a triage be completed? ---Yes  Related visit to physician within the last 2 weeks? ---Yes  Does the PT have any chronic conditions? (i.e. diabetes, asthma, etc.) ---Yes  List chronic conditions. ---HTN  Is this a behavioral health or substance abuse call? ---No     Guidelines      Guideline Title Affirmed Question  Affirmed Notes Nurse Date/Time (Eastern Time)  Influenza Follow-up Call Fever present > 3 days (72 hours)  Carmon, RN, [facility] 03/22/2015 3:44:37 PM   Disp. Time Eilene Ghazi Time) Disposition Final User          03/22/2015 3:50:41 PM See Physician within 24 Hours Yes Carmon, RN, Yevette Edwards Understands: Yes  Disagree/Comply: Comply     Care Advice Given Per Guideline      SEE PHYSICIAN WITHIN 24 HOURS: FEVER MEDICINES: ACETAMINOPHEN (E.G., TYLENOL): * Take 650 mg (two 325 mg pills) by mouth every 4-6 hours as needed. Each Regular Strength Tylenol pill has 325 mg of acetaminophen. The most you should take each day is 3,250 mg (10 Regular Strength pills a day). IBUPROFEN (E.G., MOTRIN, ADVIL): * Another choice is to take 600 mg (three 200 mg pills) by mouth every 8 hours as needed. CAUTION - NSAIDS (E.G., IBUPROFEN, NAPROXEN): * You may take this medicine with or without food. Taking it with food or milk may lessen the chance the drug will upset your stomach. NO ASPIRIN: Do not use aspirin for treatment of fever or pain (Reason: there is an association between influenza and Reye's Syndrome). FOR A RUNNY NOSE - BLOW YOUR NOSE: * Nasal mucus and discharge help wash viruses and bacteria out of the nose and sinuses. * Blowing your nose helps clean out your nose. Use a handkerchief or a paper tissue. FOR A STUFFY NOSE - USE NASAL WASHES: * Introduction: Saline (salt  water) nasal irrigation (nasal wash) is an effective and simple home remedy for treating stuffy nose and sinus congestion. The nose can be irrigated by pouring, spraying, or squirting salt water into the nose and then letting it run back out. COUGH DROPS FOR COUGH: * Cough drops can help a lot, especially for mild coughs. They reduce coughing by soothing your irritated throat and removing that tickle sensation in the back of the throat. CALL BACK IF: * Breathing difficulty develops * You become worse. CARE ADVICE given per Influenza  Follow-Up Call (Adult) guideline.   Comments  User: Romeo Apple, RN Date/Time Eilene Ghazi Time): 03/22/2015 3:56:33 PM  Appt made with Dr Danise Mina 03/23/15 at 1115am. Pt/Pcg aware/agreeable with POC.   Referrals  REFERRED TO PCP OFFICE

## 2015-03-22 NOTE — Telephone Encounter (Signed)
Will see tomorrow

## 2015-03-22 NOTE — Telephone Encounter (Signed)
Pt has appt on 03/23/15 at 11:15 with Dr Darnell Level.

## 2015-03-23 ENCOUNTER — Ambulatory Visit (INDEPENDENT_AMBULATORY_CARE_PROVIDER_SITE_OTHER): Payer: BLUE CROSS/BLUE SHIELD | Admitting: Family Medicine

## 2015-03-23 ENCOUNTER — Encounter: Payer: Self-pay | Admitting: Family Medicine

## 2015-03-23 VITALS — BP 140/80 | HR 106 | Temp 98.8°F | Ht 72.0 in | Wt 221.0 lb

## 2015-03-23 DIAGNOSIS — J111 Influenza due to unidentified influenza virus with other respiratory manifestations: Secondary | ICD-10-CM | POA: Diagnosis not present

## 2015-03-23 DIAGNOSIS — R69 Illness, unspecified: Principal | ICD-10-CM

## 2015-03-23 DIAGNOSIS — R6889 Other general symptoms and signs: Secondary | ICD-10-CM | POA: Diagnosis not present

## 2015-03-23 LAB — POCT INFLUENZA A/B
Influenza A, POC: NEGATIVE
Influenza B, POC: NEGATIVE

## 2015-03-23 NOTE — Progress Notes (Signed)
BP 140/80 mmHg  Pulse 106  Temp(Src) 98.8 F (37.1 C) (Oral)  Ht 6' (1.829 m)  Wt 221 lb (100.245 kg)  BMI 29.97 kg/m2  SpO2 97%   CC: f/u flu  Subjective:    [name], male    DOB: 05-May-1955, 60 y.o.   MRN: [id number]  HPI: Andre [name] is a 59 y.o. male presenting on 03/23/2015 for Flu like symptoms   See prior note for details. Seen here 2/20 by Dr Lorelei Pont with flu like illness - rec supportive care. Note reviewed. sxs started Saturday (5 days ago) - sinus pressure, cough, chills, rhinorrhea, watery eyes, body aches. Sinus pressure may be some better today. Fevers/sweats worse at night time. + ST.  No ear or tooth pain, abd pain, diarrhea. Normally voiding.   Treating with fast max mucinex, zyrtec, excedrin, nyquil and dayquil.   Main concern is doesn't feel ready to return to work.  dayquil shot bp up and causd headache.   Appetite ok. Eating and drinking well.  Did not receive flu shot this year.  No sick contacts at home.  No smokers at home.  Relevant past medical, surgical, family and social history reviewed and updated as indicated. Interim medical history since our last visit reviewed. Allergies and medications reviewed and updated. Current Outpatient Prescriptions on File Prior to Visit  Medication Sig  . amLODipine (NORVASC) 5 MG tablet Take 1 tablet (5 mg total) by mouth daily.  . Multiple Vitamins-Minerals (MULTIVITAMIN & MINERAL PO) Take 1 tablet by mouth daily.   No current facility-administered medications on file prior to visit.    Review of Systems Per HPI unless specifically indicated in ROS section     Objective:    BP 140/80 mmHg  Pulse 106  Temp(Src) 98.8 F (37.1 C) (Oral)  Ht 6' (1.829 m)  Wt 221 lb (100.245 kg)  BMI 29.97 kg/m2  SpO2 97%  Wt Readings from Last 3 Encounters:  03/23/15 221 lb (100.245 kg)  03/21/15 217 lb 4 oz (98.544 kg)  12/13/14 219 lb 8 oz (99.565 kg)    Physical Exam  Constitutional: He  appears well-developed and well-nourished. No distress.  Fatigued, nontoxic  HENT:  Head: Normocephalic and atraumatic.  Right Ear: Hearing, tympanic membrane, external ear and ear canal normal.  Left Ear: Hearing, tympanic membrane, external ear and ear canal normal.  Nose: Mucosal edema present. No rhinorrhea. Right sinus exhibits no maxillary sinus tenderness and no frontal sinus tenderness. Left sinus exhibits no maxillary sinus tenderness and no frontal sinus tenderness.  Mouth/Throat: Uvula is midline, oropharynx is clear and moist and mucous membranes are normal. No oropharyngeal exudate, posterior oropharyngeal edema, posterior oropharyngeal erythema or tonsillar abscesses.  R>L nasal mucosal congestion/inflammation  Eyes: Conjunctivae and EOM are normal. Pupils are equal, round, and reactive to light. No scleral icterus.  Neck: Normal range of motion. Neck supple.  Cardiovascular: Normal rate, regular rhythm, normal heart sounds and intact distal pulses.   No murmur heard. Pulmonary/Chest: Effort normal and breath sounds normal. No respiratory distress. He has no wheezes. He has no rales.  Lymphadenopathy:    He has no cervical adenopathy.  Skin: Skin is warm and dry. No rash noted.  Nursing note and vitals reviewed.  Results for orders placed or performed in visit on 03/23/15  POCT Influenza A/B  Result Value Ref Range   Influenza A, POC Negative Negative   Influenza B, POC Negative Negative      Assessment &  Plan:   Problem List Items Addressed This Visit    Influenza-like illness - Primary    sxs still consistent with ILI - discussed continued supportive care.  rec plain mucinex with plenty of fluid, tylenol.  Out of work note written for rest of week. Update if not improving as expected. Red flags to return to seek care discussed. Flu swab negative today       Other Visit Diagnoses    Flu-like symptoms        Relevant Orders    POCT Influenza A/B (Completed)          Follow up plan: Return if symptoms worsen or fail to improve.

## 2015-03-23 NOTE — Patient Instructions (Signed)
You have a upper respiratory infection likely viral. Antibiotics are not needed for this.  Viral infections usually take 7-10 days to resolve.  The cough can last a few weeks to go away. Push fluids and plenty of rest. Plain mucinex (guaifenesin) with plenty of water to hep mobilize mucous. Tylenol [phone] mg twice dialy with meals for aches. Stop dayquil. Please return if you are not improving as expected, or if you have high fevers (>101.5) or difficulty swallowing or worsening productive cough. Call clinic with questions.  Good to see you today. I hope you start feeling better soon.

## 2015-03-23 NOTE — Progress Notes (Signed)
Pre visit review using our clinic review tool, if applicable. No additional management support is needed unless otherwise documented below in the visit note. nneg

## 2015-03-23 NOTE — Assessment & Plan Note (Addendum)
sxs still consistent with ILI - discussed continued supportive care.  rec plain mucinex with plenty of fluid, tylenol.  Out of work note written for rest of week. Update if not improving as expected. Red flags to return to seek care discussed. Flu swab negative today

## 2015-06-13 ENCOUNTER — Other Ambulatory Visit: Payer: Self-pay | Admitting: Family Medicine

## 2015-10-14 ENCOUNTER — Telehealth: Payer: Self-pay | Admitting: Family Medicine

## 2015-10-14 MED ORDER — AMLODIPINE BESYLATE 5 MG PO TABS: 5.0000 mg | ORAL_TABLET | Freq: Every day | ORAL | 0 refills | Status: DC

## 2015-10-14 NOTE — Telephone Encounter (Signed)
Sent in. Will need CPE prior to more refils.

## 2015-10-14 NOTE — Telephone Encounter (Signed)
Pt is in need of his BP med - he thought he had some left but doesn't.  He uses the [facility] on [address].  He takes Alopine 5 mg tab.

## 2015-11-14 ENCOUNTER — Encounter: Payer: BLUE CROSS/BLUE SHIELD | Admitting: Family Medicine

## 2015-12-14 ENCOUNTER — Ambulatory Visit (INDEPENDENT_AMBULATORY_CARE_PROVIDER_SITE_OTHER): Payer: BLUE CROSS/BLUE SHIELD | Admitting: Family Medicine

## 2015-12-14 ENCOUNTER — Encounter: Payer: Self-pay | Admitting: Family Medicine

## 2015-12-14 VITALS — BP 138/90 | HR 84 | Temp 98.1°F | Ht 73.0 in | Wt 221.0 lb

## 2015-12-14 DIAGNOSIS — I10 Essential (primary) hypertension: Secondary | ICD-10-CM

## 2015-12-14 DIAGNOSIS — Z0000 Encounter for general adult medical examination without abnormal findings: Secondary | ICD-10-CM

## 2015-12-14 DIAGNOSIS — E7800 Pure hypercholesterolemia, unspecified: Secondary | ICD-10-CM | POA: Diagnosis not present

## 2015-12-14 DIAGNOSIS — Z125 Encounter for screening for malignant neoplasm of prostate: Secondary | ICD-10-CM

## 2015-12-14 DIAGNOSIS — Z1159 Encounter for screening for other viral diseases: Secondary | ICD-10-CM

## 2015-12-14 DIAGNOSIS — R7309 Other abnormal glucose: Secondary | ICD-10-CM

## 2015-12-14 DIAGNOSIS — Z23 Encounter for immunization: Secondary | ICD-10-CM | POA: Diagnosis not present

## 2015-12-14 LAB — BASIC METABOLIC PANEL
BUN: 9 mg/dL (ref 6–23)
CALCIUM: 9.8 mg/dL (ref 8.4–10.5)
CO2: 31 mEq/L (ref 19–32)
Chloride: 102 mEq/L (ref 96–112)
Creatinine, Ser: 0.69 mg/dL (ref 0.40–1.50)
GFR: 125.1 mL/min (ref >60.00)
Glucose, Bld: 87 mg/dL (ref 70–99)
POTASSIUM: 4.2 meq/L (ref 3.5–5.1)
Sodium: 141 mEq/L (ref 135–145)

## 2015-12-14 LAB — LIPID PANEL
CHOL/HDL RATIO: 3
Cholesterol: 174 mg/dL (ref 0–200)
HDL: 58.2 mg/dL (ref >39.00)
LDL CALC: 89 mg/dL (ref 0–99)
NonHDL: 115.84
Triglycerides: 133 mg/dL (ref 0.0–149.0)
VLDL: 26.6 mg/dL (ref 0.0–40.0)

## 2015-12-14 LAB — PSA: PSA: 1.22 ng/mL (ref 0.10–4.00)

## 2015-12-14 MED ORDER — AMLODIPINE BESYLATE 5 MG PO TABS: 5.0000 mg | ORAL_TABLET | Freq: Every day | ORAL | 3 refills | Status: DC

## 2015-12-14 NOTE — Assessment & Plan Note (Signed)
Check FLP today. 

## 2015-12-14 NOTE — Assessment & Plan Note (Signed)
Chronic, stable. Continue current regimen. 

## 2015-12-14 NOTE — Progress Notes (Signed)
Pre visit review using our clinic review tool, if applicable. No additional management support is needed unless otherwise documented below in the visit note. 

## 2015-12-14 NOTE — Patient Instructions (Addendum)
Tdap today. Labs today You are doing well today. Return as needed or in 1 year for next physical.   Health Maintenance, Male A healthy lifestyle and preventative care can promote health and wellness.  Maintain regular health, dental, and eye exams.  Eat a healthy diet. Foods like vegetables, fruits, whole grains, low-fat dairy products, and lean protein foods contain the nutrients you need and are low in calories. Decrease your intake of foods high in solid fats, added sugars, and salt. Get information about a proper diet from your health care provider, if necessary.  Regular physical exercise is one of the most important things you can do for your health. Most adults should get at least 150 minutes of moderate-intensity exercise (any activity that increases your heart rate and causes you to sweat) each week. In addition, most adults need muscle-strengthening exercises on 2 or more days a week.   Maintain a healthy weight. The body mass index (BMI) is a screening tool to identify possible weight problems. It provides an estimate of body fat based on height and weight. Your health care provider can find your BMI and can help you achieve or maintain a healthy weight. For males 20 years and older:  A BMI below 18.5 is considered underweight.  A BMI of 18.5 to 24.9 is normal.  A BMI of 25 to 29.9 is considered overweight.  A BMI of 30 and above is considered obese.  Maintain normal blood lipids and cholesterol by exercising and minimizing your intake of saturated fat. Eat a balanced diet with plenty of fruits and vegetables. Blood tests for lipids and cholesterol should begin at age 51 and be repeated every 5 years. If your lipid or cholesterol levels are high, you are over age 69, or you are at high risk for heart disease, you may need your cholesterol levels checked more frequently.Ongoing high lipid and cholesterol levels should be treated with medicines if diet and exercise are not  working.  If you smoke, find out from your health care provider how to quit. If you do not use tobacco, do not start.  Lung cancer screening is recommended for adults aged 57-80 years who are at high risk for developing lung cancer because of a history of smoking. A yearly low-dose CT scan of the lungs is recommended for people who have at least a 30-pack-year history of smoking and are current smokers or have quit within the past 15 years. A pack year of smoking is smoking an average of 1 pack of cigarettes a day for 1 year (for example, a 30-pack-year history of smoking could mean smoking 1 pack a day for 30 years or 2 packs a day for 15 years). Yearly screening should continue until the smoker has stopped smoking for at least 15 years. Yearly screening should be stopped for people who develop a health problem that would prevent them from having lung cancer treatment.  If you choose to drink alcohol, do not have more than 2 drinks per day. One drink is considered to be 12 oz (360 mL) of beer, 5 oz (150 mL) of wine, or 1.5 oz (45 mL) of liquor.  Avoid the use of street drugs. Do not share needles with anyone. Ask for help if you need support or instructions about stopping the use of drugs.  High blood pressure causes heart disease and increases the risk of stroke. High blood pressure is more likely to develop in:  People who have blood pressure in the end  of the normal range (100-139/85-89 mm Hg).  People who are overweight or obese.  People who are African American.  If you are 62-3 years of age, have your blood pressure checked every 3-5 years. If you are 36 years of age or older, have your blood pressure checked every year. You should have your blood pressure measured twice-once when you are at a hospital or clinic, and once when you are not at a hospital or clinic. Record the average of the two measurements. To check your blood pressure when you are not at a hospital or clinic, you can  use:  An automated blood pressure machine at a pharmacy.  A home blood pressure monitor.  If you are 51-3 years old, ask your health care provider if you should take aspirin to prevent heart disease.  Diabetes screening involves taking a blood sample to check your fasting blood sugar level. This should be done once every 3 years after age 25 if you are at a normal weight and without risk factors for diabetes. Testing should be considered at a younger age or be carried out more frequently if you are overweight and have at least 1 risk factor for diabetes.  Colorectal cancer can be detected and often prevented. Most routine colorectal cancer screening begins at the age of 60 and continues through age 30. However, your health care provider may recommend screening at an earlier age if you have risk factors for colon cancer. On a yearly basis, your health care provider may provide home test kits to check for hidden blood in the stool. A small camera at the end of a tube may be used to directly examine the colon (sigmoidoscopy or colonoscopy) to detect the earliest forms of colorectal cancer. Talk to your health care provider about this at age 38 when routine screening begins. A direct exam of the colon should be repeated every 5-10 years through age 64, unless early forms of precancerous polyps or small growths are found.  People who are at an increased risk for hepatitis B should be screened for this virus. You are considered at high risk for hepatitis B if:  You were born in a country where hepatitis B occurs often. Talk with your health care provider about which countries are considered high risk.  Your parents were born in a high-risk country and you have not received a shot to protect against hepatitis B (hepatitis B vaccine).  You have HIV or AIDS.  You use needles to inject street drugs.  You live with, or have sex with, someone who has hepatitis B.  You are a man who has sex with other  men (MSM).  You get hemodialysis treatment.  You take certain medicines for conditions like cancer, organ transplantation, and autoimmune conditions.  Hepatitis C blood testing is recommended for all people born from [birth year] through 1963 and any individual with known risk factors for hepatitis C.  Healthy men should no longer receive prostate-specific antigen (PSA) blood tests as part of routine cancer screening. Talk to your health care provider about prostate cancer screening.  Testicular cancer screening is not recommended for adolescents or adult males who have no symptoms. Screening includes self-exam, a health care provider exam, and other screening tests. Consult with your health care provider about any symptoms you have or any concerns you have about testicular cancer.  Practice safe sex. Use condoms and avoid high-risk sexual practices to reduce the spread of sexually transmitted infections (STIs).  You should be  screened for STIs, including gonorrhea and chlamydia if:  You are sexually active and are younger than 24 years.  You are older than 24 years, and your health care provider tells you that you are at risk for this type of infection.  Your sexual activity has changed since you were last screened, and you are at an increased risk for chlamydia or gonorrhea. Ask your health care provider if you are at risk.  If you are at risk of being infected with HIV, it is recommended that you take a prescription medicine daily to prevent HIV infection. This is called pre-exposure prophylaxis (PrEP). You are considered at risk if:  You are a man who has sex with other men (MSM).  You are a heterosexual man who is sexually active with multiple partners.  You take drugs by injection.  You are sexually active with a partner who has HIV.  Talk with your health care provider about whether you are at high risk of being infected with HIV. If you choose to begin PrEP, you should first be tested  for HIV. You should then be tested every 3 months for as long as you are taking PrEP.  Use sunscreen. Apply sunscreen liberally and repeatedly throughout the day. You should seek shade when your shadow is shorter than you. Protect yourself by wearing long sleeves, pants, a wide-brimmed hat, and sunglasses year round whenever you are outdoors.  Tell your health care provider of new moles or changes in moles, especially if there is a change in shape or color. Also, tell your health care provider if a mole is larger than the size of a pencil eraser.  A one-time screening for abdominal aortic aneurysm (AAA) and surgical repair of large AAAs by ultrasound is recommended for men aged 21-75 years who are current or former smokers.  Stay current with your vaccines (immunizations). This information is not intended to replace advice given to you by your health care provider. Make sure you discuss any questions you have with your health care provider. Document Released: 07/14/2007 Document Revised: 02/05/2014 Document Reviewed: 10/19/2014 Elsevier Interactive Patient Education  2017 [employer].

## 2015-12-14 NOTE — Assessment & Plan Note (Addendum)
Preventative protocols reviewed and updated unless pt declined. Discussed healthy diet and lifestyle changes to affect sustainable weight loss. Pt states goal weight ~210lbs.

## 2015-12-14 NOTE — Assessment & Plan Note (Signed)
Check labs today.

## 2015-12-14 NOTE — Progress Notes (Signed)
BP 138/90 (BP Location: Right Arm, Cuff Size: Large)   Pulse 84   Temp 98.1 F (36.7 C) (Oral)   Ht 6\' 1"  (1.854 m)   Wt 221 lb (100.2 kg)   BMI 29.16 kg/m    CC: CPE Subjective:    [name], male    DOB: 23-Oct-1955, 60 y.o.   MRN: [id number]  HPI: Fayette Fieser is a 60 y.o. male presenting on 12/14/2015 for Annual Exam   Preventative: COLONOSCOPY Date: 01/2012 tubular adenoma Ardis Hughs), rpt 5 yrs Prostate cancer screening - requests continued yearly. Flu shot - declines Td 2007 Seat belt use discussed. Sunscreen use discussed. No changing moles on skin.  Non smoker. Wife smokes.  Alcohol - none  Lives with wife, no pets Edu: 42 yr college Occupation: route salesman Activity: no regular exercise, very involved in church Diet: good water, fruits/vegetables daily  Relevant past medical, surgical, family and social history reviewed and updated as indicated. Interim medical history since our last visit reviewed. Allergies and medications reviewed and updated. Current Outpatient Prescriptions on File Prior to Visit  Medication Sig  . Multiple Vitamins-Minerals (MULTIVITAMIN & MINERAL PO) Take 1 tablet by mouth daily.   No current facility-administered medications on file prior to visit.     Review of Systems  Constitutional: Negative for activity change, appetite change, chills, fatigue, fever and unexpected weight change.  HENT: Negative for hearing loss.   Eyes: Negative for visual disturbance.  Respiratory: Negative for cough, chest tightness, shortness of breath and wheezing.   Cardiovascular: Negative for chest pain, palpitations and leg swelling.  Gastrointestinal: Negative for abdominal distention, abdominal pain, blood in stool, constipation, diarrhea, nausea and vomiting.  Genitourinary: Negative for difficulty urinating and hematuria.  Musculoskeletal: Negative for arthralgias, myalgias and neck pain.  Skin: Negative for rash.    Neurological: Negative for dizziness, seizures, syncope and headaches.  Hematological: Negative for adenopathy. Does not bruise/bleed easily.  Psychiatric/Behavioral: Negative for dysphoric mood. The patient is not nervous/anxious.    Per HPI unless specifically indicated in ROS section     Objective:    BP 138/90 (BP Location: Right Arm, Cuff Size: Large)   Pulse 84   Temp 98.1 F (36.7 C) (Oral)   Ht 6\' 1"  (1.854 m)   Wt 221 lb (100.2 kg)   BMI 29.16 kg/m   Wt Readings from Last 3 Encounters:  12/14/15 221 lb (100.2 kg)  03/23/15 221 lb (100.2 kg)  03/21/15 217 lb 4 oz (98.5 kg)    Physical Exam  Constitutional: He is oriented to person, place, and time. He appears well-developed and well-nourished. No distress.  HENT:  Head: Normocephalic and atraumatic.  Right Ear: Hearing, tympanic membrane, external ear and ear canal normal.  Left Ear: Hearing, tympanic membrane, external ear and ear canal normal.  Nose: Nose normal.  Mouth/Throat: Uvula is midline, oropharynx is clear and moist and mucous membranes are normal. No oropharyngeal exudate, posterior oropharyngeal edema or posterior oropharyngeal erythema.  Eyes: Conjunctivae and EOM are normal. Pupils are equal, round, and reactive to light. No scleral icterus.  Neck: Normal range of motion. Neck supple. No thyromegaly present.  Cardiovascular: Normal rate, regular rhythm, normal heart sounds and intact distal pulses.   No murmur heard. Pulses:      Radial pulses are 2+ on the right side, and 2+ on the left side.  Pulmonary/Chest: Effort normal and breath sounds normal. No respiratory distress. He has no wheezes. He has no rales.  Abdominal: Soft. Bowel sounds are normal. He exhibits no distension and no mass. There is no tenderness. There is no rebound and no guarding.  Genitourinary: Rectum normal and prostate normal. Rectal exam shows no external hemorrhoid, no internal hemorrhoid, no fissure, no mass, no tenderness and  anal tone normal. Prostate is not enlarged and not tender.  Musculoskeletal: Normal range of motion. He exhibits no edema.  Lymphadenopathy:    He has no cervical adenopathy.  Neurological: He is alert and oriented to person, place, and time.  CN grossly intact, station and gait intact  Skin: Skin is warm and dry. No rash noted.  noninflamed epidermal cyst L mid back Large black head midline lumbar back  Psychiatric: He has a normal mood and affect. His behavior is normal. Judgment and thought content normal.  Nursing note and vitals reviewed.  Results for orders placed or performed in visit on 12/14/15  Lipid panel  Result Value Ref Range   Cholesterol 174 0 - 200 mg/dL   Triglycerides 133.0 0.0 - 149.0 mg/dL   HDL 58.20 >39.00 mg/dL   VLDL 26.6 0.0 - 40.0 mg/dL   LDL Cholesterol 89 0 - 99 mg/dL   Total CHOL/HDL Ratio 3    NonHDL 115.84   PSA  Result Value Ref Range   PSA 1.22 0.10 - 4.00 ng/mL  Basic metabolic panel  Result Value Ref Range   Sodium 141 135 - 145 mEq/L   Potassium 4.2 3.5 - 5.1 mEq/L   Chloride 102 96 - 112 mEq/L   CO2 31 19 - 32 mEq/L   Glucose, Bld 87 70 - 99 mg/dL   BUN 9 6 - 23 mg/dL   Creatinine, Ser 0.69 0.40 - 1.50 mg/dL   Calcium 9.8 8.4 - 10.5 mg/dL   GFR 125.10 >60.00 mL/min  Hepatitis C antibody  Result Value Ref Range   HCV Ab NEGATIVE NEGATIVE       Assessment & Plan:   Problem List Items Addressed This Visit    Essential hypertension, benign    Chronic, stable. Continue current regimen.       Relevant Medications   amLODipine (NORVASC) 5 MG tablet   HYPERCHOLESTEROLEMIA    Check FLP today.       Relevant Medications   amLODipine (NORVASC) 5 MG tablet   Other Relevant Orders   Lipid panel (Completed)   HYPERGLYCEMIA    Check labs today.       Relevant Orders   Basic metabolic panel (Completed)   Routine general medical examination at a health care facility - Primary    Preventative protocols reviewed and updated unless pt  declined. Discussed healthy diet and lifestyle changes to affect sustainable weight loss. Pt states goal weight ~210lbs.        Other Visit Diagnoses    Encounter for hepatitis C screening test for low risk patient       Relevant Orders   Hepatitis C antibody (Completed)   Special screening for malignant neoplasm of prostate       Relevant Orders   PSA (Completed)   Need for Tdap vaccination       Relevant Orders   Tdap vaccine greater than or equal to 7yo IM (Completed)       Follow up plan: Return in about 1 year (around 12/13/2016) for annual exam, prior fasting for blood work.  Ria Bush, MD

## 2015-12-15 LAB — HEPATITIS C ANTIBODY: HCV Ab: NEGATIVE

## 2016-04-23 ENCOUNTER — Telehealth: Payer: Self-pay

## 2016-04-23 ENCOUNTER — Encounter: Payer: Self-pay | Admitting: Family Medicine

## 2016-04-23 ENCOUNTER — Ambulatory Visit: Payer: Self-pay | Admitting: Nurse Practitioner

## 2016-04-23 ENCOUNTER — Ambulatory Visit (INDEPENDENT_AMBULATORY_CARE_PROVIDER_SITE_OTHER): Payer: BLUE CROSS/BLUE SHIELD | Admitting: Family Medicine

## 2016-04-23 VITALS — BP 120/72 | HR 88 | Temp 98.3°F | Ht 73.0 in | Wt 220.5 lb

## 2016-04-23 DIAGNOSIS — R42 Dizziness and giddiness: Secondary | ICD-10-CM | POA: Diagnosis not present

## 2016-04-23 DIAGNOSIS — Z566 Other physical and mental strain related to work: Secondary | ICD-10-CM

## 2016-04-23 DIAGNOSIS — R319 Hematuria, unspecified: Secondary | ICD-10-CM

## 2016-04-23 LAB — POC URINALSYSI DIPSTICK (AUTOMATED)
Bilirubin, UA: NEGATIVE
Blood, UA: NEGATIVE
GLUCOSE UA: NEGATIVE
Ketones, UA: NEGATIVE
LEUKOCYTES UA: NEGATIVE
Nitrite, UA: NEGATIVE
SPEC GRAV UA: 1.03 (ref 1.030–1.035)
Urobilinogen, UA: 0.2 (ref ?–2.0)
pH, UA: 6 (ref 5.0–8.0)

## 2016-04-23 NOTE — Telephone Encounter (Signed)
I spoke with Mrs Skorupski and pt is resting now and would like to see Dr Lorelei Pont today at 3:45; if pt condition worsens before appt pt's wife will cb.

## 2016-04-23 NOTE — Telephone Encounter (Signed)
PLEASE NOTE: All timestamps contained within this report are represented as [place] Standard Time. CONFIDENTIALTY NOTICE: This fax transmission is intended only for the addressee. It contains information that is legally privileged, confidential or otherwise protected from use or disclosure. If you are not the intended recipient, you are strictly prohibited from reviewing, disclosing, copying using or disseminating any of this information or taking any action in reliance on or regarding this information. If you have received this fax in error, please notify us immediately by telephone so that we can arrange for its return to [place]. Phone: [phone], Toll-Free: [phone], Fax: [phone] Page: 1 of 2 Call Id: [id number] [place] Patient Name: Andre [name] Gender: Male DOB: Oct 09, 1955 Age: 61 Y 77 M 12 D Return Phone Number: [phone] (Primary), [phone] (Secondary) City/State/Zip: [place] [address] Client Ault Day - Client Client Site [place] Physician Ria Bush - MD Who Is Calling Patient / Member / Family / Caregiver Call Type Triage / Clinical Caller Name Montgomery Revron Relationship To Patient Spouse Return Phone Number [phone] (Primary) Chief Complaint Weakness, Generalized Reason for Call Symptomatic / Request for Health Information Initial Comment Appointment Disposition EMR Patient Refused Appointment Info pasted into Epic No Nurse Assessment Nurse: Angeline Slim, RN, [place] Date/Time (Eastern Time): 04/23/2016 8:37:51 AM Confirm and document reason for call. If symptomatic, describe symptoms. ---Caller states pt has weakness and difficulty standing up started on Friday. No fever or other signs of illness, he just says he is weak Does the PT have any chronic conditions? (i.e. diabetes, asthma,  etc.) ---Yes List chronic conditions. ---HTN amlodipine 5mg  Guidelines Guideline Title Affirmed Question Weakness (Generalized) and Fatigue [1] MODERATE weakness (i.e., interferes with work, school, normal activities) AND [2] cause unknown (Exceptions: weakness with acute minor illness, or weakness from poor fluid intake) Disp. Time Eilene Ghazi Time) Disposition Final User 04/23/2016 8:52:45 AM See Physician within 4 Hours (or PCP triage) Yes Zellers, RN, [place] Referrals REFERRED TO PCP OFFICE Care Advice Given Per Guideline SEE PHYSICIAN WITHIN 4 HOURS (or PCP triage): * IF OFFICE WILL BE OPEN: You need to be seen within the next 3 or 4 hours. Call your doctor's office now or as soon as it opens. BRING MEDICINES: * Please bring a list of your current medicines when you go to see the doctor. * It is also a good idea to bring the pill bottles too. This will help the doctor to make certain you are taking the right medicines and the right dose. CALL BACK IF: * You become worse. PLEASE NOTE: All timestamps contained within this report are represented as [place] Standard Time. CONFIDENTIALTY NOTICE: This fax transmission is intended only for the addressee. It contains information that is legally privileged, confidential or otherwise protected from use or disclosure. If you are not the intended recipient, you are strictly prohibited from reviewing, disclosing, copying using or disseminating any of this information or taking any action in reliance on or regarding this information. If you have received this fax in error, please notify us immediately by telephone so that we can arrange for its return to [place]. Phone: [phone], Toll-Free: [phone], Fax: [phone] Page: 2 of 2 Call Id: [id number] Comments User: Margarito Courser, RN Date/Time Eilene Ghazi Time): 04/23/2016 8:54:30 AM Caller is requesting an appointment with Dr. Danise Mina because he wants to see a male doctor for this problem. Please call  asap to  schedule appt with Dr. Danise Mina, patient states he will wait

## 2016-04-23 NOTE — Progress Notes (Signed)
Pre visit review using our clinic review tool, if applicable. No additional management support is needed unless otherwise documented below in the visit note. 

## 2016-04-23 NOTE — Progress Notes (Signed)
Dr. Frederico Hamman T. Ivonna Kinnick, MD, [facility] [place], [address] Phone: [phone] Fax: [phone]  04/23/2016  Patient: Andre [name], MRN: [id number], DOB: Feb 01, 1955, 61 y.o.  Primary Physician:  Ria Bush, MD   Chief Complaint  Patient presents with  . Dizziness    Yesterday morning  . Nausea   Subjective:   Andre [name] is a 61 y.o. very pleasant male patient who presents with the following:  Got up about 6:15 in the morning na dthen got really dizzy.  After got lightheaded, this was after urination.   1/2 of the room moving / dizzy.  Then maybe a few more minutes, woke up and had some lightheadedness.   Recent job duties change.  Has been having some heavy parts - all the sudden much more physical.  HA 2 weeks ago, better with tylenol.  More physically demanding - working 12 hour days.  Has also been more irritable and lashed out at some people at work.  Blood in urine. This was at DOT physical.  SG 1.030 today  Past Medical History, Surgical History, Social History, Family History, Problem List, Medications, and Allergies have been reviewed and updated if relevant.  Patient Active Problem List   Diagnosis Date Noted  . Influenza-like illness 03/23/2015  . Epidermal inclusion cyst 12/13/2014  . Gross hematuria 05/03/2014  . Achilles tendon pain 12/31/2013  . Rosacea 04/20/2010  . Routine general medical examination at a health care facility 04/20/2010  . HYPERGLYCEMIA 09/06/2008  . EPIDERMOID CYST 04/05/2008  . Essential hypertension, benign 12/12/2006  . HYPERCHOLESTEROLEMIA 11/25/2006    Past Medical History:  Diagnosis Date  . Essential hypertension, benign   . History of colon polyps 2014  . Nephrolithiasis 06/2014   ER visit  . Pure hypercholesterolemia     Past Surgical History:  Procedure Laterality Date  . COLONOSCOPY  01/2012   tubular adenoma Ardis Hughs), rpt 5 yrs  .  TONSILLECTOMY     as a child    Social History   Social History  . Marital status: Married    Spouse name: N/A  . Number of children: 2  . Years of education: N/A   Occupational History  . Sales and part delivery/[employer]   Social History Main Topics  . Smoking status: Never Smoker  . Smokeless tobacco: Never Used  . Alcohol use No  . Drug use: No  . Sexual activity: Yes   Other Topics Concern  . Not on file   Social History Narrative   Lives with wife, no pets   Edu: 29 yr college   Occupation: route salesman   Activity: no regular exercise   Diet: good water, fruits/vegetables daily    Family History  Problem Relation Age of Onset  . Cancer Sister 70    colon  . Cancer Sister 47    breast  . Stomach cancer Neg Hx   . CAD Neg Hx   . Stroke Neg Hx   . Diabetes Neg Hx   . Hypertension Mother     No Known Allergies  Medication list reviewed and updated in full in [facility].   GEN: No acute illnesses, no fevers, chills. GI: No n/v/d, eating normally Pulm: No SOB Interactive and getting along well at home.  Otherwise, ROS is as per the HPI.  Objective:   BP 120/72   Pulse 88   Temp 98.3 F (36.8 C) (Oral)  Ht 6\' 1"  (1.854 m)   Wt 220 lb 8 oz (100 kg)   BMI 29.09 kg/m   GEN: WDWN, NAD, Non-toxic, A & O x 3 HEENT: Atraumatic, Normocephalic. Neck supple. No masses, No LAD. Ears and Nose: No external deformity. CV: RRR, No M/G/R. No JVD. No thrill. No extra heart sounds. PULM: CTA B, no wheezes, crackles, rhonchi. No retractions. No resp. distress. No accessory muscle use. EXTR: No c/c/e NEURO Normal gait.  PSYCH: Normally interactive. Conversant. Not depressed or anxious appearing.  Calm demeanor.   Neuro: CN 2-12 grossly intact. PERRLA. EOMI. Sensation intact throughout. Str 5/5 all extremities. DTR 2+. No clonus. A and o x 4. Romberg neg. Finger nose neg. Heel -shin neg.     Laboratory and Imaging Data: Results for  orders placed or performed in visit on 04/23/16  POCT Urinalysis Dipstick (Automated)  Result Value Ref Range   Color, UA yellow    Clarity, UA clear    Glucose, UA negative    Bilirubin, UA negative    Ketones, UA negative    Spec Grav, UA 1.030 1.030 - 1.035   Blood, UA negative    pH, UA 6.0 5.0 - 8.0   Protein, UA 1+    Urobilinogen, UA 0.2 Negative - 2.0   Nitrite, UA negative    Leukocytes, UA Negative Negative    Lab Results  Component Value Date   WBC 7.8 04/17/2010   HGB 15.8 04/17/2010   HCT 45.8 04/17/2010   PLT 223.0 04/17/2010   GLUCOSE 87 12/14/2015   CHOL 174 12/14/2015   TRIG 133.0 12/14/2015   HDL 58.20 12/14/2015   LDLDIRECT 109.7 04/17/2010   LDLCALC 89 12/14/2015   ALT 28 01/06/2014   AST 24 01/06/2014   NA 141 12/14/2015   K 4.2 12/14/2015   CL 102 12/14/2015   CREATININE 0.69 12/14/2015   BUN 9 12/14/2015   CO2 31 12/14/2015   TSH 1.63 04/17/2010   PSA 1.22 12/14/2015     Assessment and Plan:   Stress at work  Dizzy - Plan: POCT Urinalysis Dipstick (Automated)  Hematuria, unspecified type - Plan: Urinalysis, microscopic only  >25 minutes spent in face to face time with patient, >50% spent in counselling or coordination of care   Long conversation with the patient and his wife.  All components of the physical exam are normal and reassuring.  Increase rest physically and mentally at work.  I think that this all boils down to increased work load and his body and mind's ability to adapt to do this at age 61.  Possible component of post-micturition dizziness on Sunday morning.  Hematuria at the DOT exam is not present today.  I will also send this for microscopy.  The patient and his wife agreed with my assessment.  Follow-up: only if symptoms recur.  Orders Placed This Encounter  Procedures  . Urinalysis, microscopic only  . POCT Urinalysis Dipstick (Automated)    Signed,  Laureano Hetzer T. Debar Plate, MD   Allergies as of 04/23/2016     No Known Allergies     Medication List       Accurate as of 04/23/16 11:59 PM. Always use your most recent med list.          amLODipine 5 MG tablet Commonly known as:  NORVASC Take 1 tablet (5 mg total) by mouth daily.   MULTIVITAMIN & MINERAL PO Take 1 tablet by mouth daily.

## 2016-04-24 LAB — URINALYSIS, MICROSCOPIC ONLY

## 2016-04-30 ENCOUNTER — Other Ambulatory Visit: Payer: Self-pay | Admitting: Family Medicine

## 2016-04-30 DIAGNOSIS — R3129 Other microscopic hematuria: Secondary | ICD-10-CM

## 2016-07-11 ENCOUNTER — Encounter: Payer: Self-pay | Admitting: Family Medicine

## 2016-08-02 ENCOUNTER — Encounter: Payer: Self-pay | Admitting: Family Medicine

## 2016-08-02 NOTE — Progress Notes (Signed)
done

## 2016-08-27 ENCOUNTER — Telehealth: Payer: Self-pay | Admitting: Family Medicine

## 2016-08-27 NOTE — Telephone Encounter (Signed)
08/02/16 Letter from Owens Loffler MD sent out by certified / registered mail on August 03 2016  daj  Letter returned as undeliverable, unclaimed, return to sender after three attempts by [employer] on August 26, 2016.  Letter placed in another envelope and resent as 1st class mail which does not require a signature. daj

## 2016-09-28 ENCOUNTER — Telehealth: Payer: Self-pay | Admitting: Family Medicine

## 2016-09-28 NOTE — Telephone Encounter (Signed)
Left message asking pt to call the office regarding referral °

## 2016-12-14 ENCOUNTER — Encounter: Payer: BLUE CROSS/BLUE SHIELD | Admitting: Family Medicine

## 2016-12-21 ENCOUNTER — Other Ambulatory Visit: Payer: Self-pay | Admitting: Family Medicine

## 2017-02-18 ENCOUNTER — Encounter: Payer: Self-pay | Admitting: Gastroenterology

## 2017-02-18 ENCOUNTER — Encounter: Payer: Self-pay | Admitting: Family Medicine

## 2017-02-18 ENCOUNTER — Ambulatory Visit (INDEPENDENT_AMBULATORY_CARE_PROVIDER_SITE_OTHER)
Admission: RE | Admit: 2017-02-18 | Discharge: 2017-02-18 | Disposition: A | Discharge status: Home / Self Care | Payer: BLUE CROSS/BLUE SHIELD | Source: Ambulatory Visit | Attending: Family Medicine | Admitting: Family Medicine

## 2017-02-18 ENCOUNTER — Ambulatory Visit (INDEPENDENT_AMBULATORY_CARE_PROVIDER_SITE_OTHER): Payer: BLUE CROSS/BLUE SHIELD | Admitting: Family Medicine

## 2017-02-18 VITALS — BP 130/64 | HR 80 | Temp 97.9°F | Ht 71.0 in | Wt 220.2 lb

## 2017-02-18 DIAGNOSIS — Z1211 Encounter for screening for malignant neoplasm of colon: Secondary | ICD-10-CM

## 2017-02-18 DIAGNOSIS — R0989 Other specified symptoms and signs involving the circulatory and respiratory systems: Secondary | ICD-10-CM | POA: Diagnosis not present

## 2017-02-18 DIAGNOSIS — Z125 Encounter for screening for malignant neoplasm of prostate: Secondary | ICD-10-CM | POA: Diagnosis not present

## 2017-02-18 DIAGNOSIS — R310 Gross hematuria: Secondary | ICD-10-CM | POA: Diagnosis not present

## 2017-02-18 DIAGNOSIS — Z0001 Encounter for general adult medical examination with abnormal findings: Secondary | ICD-10-CM

## 2017-02-18 DIAGNOSIS — I10 Essential (primary) hypertension: Secondary | ICD-10-CM

## 2017-02-18 DIAGNOSIS — L720 Epidermal cyst: Secondary | ICD-10-CM

## 2017-02-18 DIAGNOSIS — R0689 Other abnormalities of breathing: Secondary | ICD-10-CM | POA: Insufficient documentation

## 2017-02-18 LAB — COMPREHENSIVE METABOLIC PANEL
ALBUMIN: 4.5 g/dL (ref 3.5–5.2)
ALK PHOS: 66 U/L (ref 39–117)
ALT: 21 U/L (ref 0–53)
AST: 17 U/L (ref 0–37)
BUN: 12 mg/dL (ref 6–23)
CO2: 32 mEq/L (ref 19–32)
Calcium: 9.5 mg/dL (ref 8.4–10.5)
Chloride: 102 mEq/L (ref 96–112)
Creatinine, Ser: 0.73 mg/dL (ref 0.40–1.50)
GFR: 116.74 mL/min (ref >60.00)
GLUCOSE: 108 mg/dL — AB (ref 70–99)
Potassium: 4.4 mEq/L (ref 3.5–5.1)
Sodium: 139 mEq/L (ref 135–145)
Total Bilirubin: 0.5 mg/dL (ref 0.2–1.2)
Total Protein: 7.1 g/dL (ref 6.0–8.3)

## 2017-02-18 LAB — CBC WITH DIFFERENTIAL/PLATELET
BASOS ABS: 0 10*3/uL (ref 0.0–0.1)
Basophils Relative: 0.7 % (ref 0.0–3.0)
Eosinophils Absolute: 0.1 10*3/uL (ref 0.0–0.7)
Eosinophils Relative: 1.8 % (ref 0.0–5.0)
HCT: 46.1 % (ref 39.0–52.0)
Hemoglobin: 15.5 g/dL (ref 13.0–17.0)
Lymphocytes Relative: 33.5 % (ref 12.0–46.0)
Lymphs Abs: 2 10*3/uL (ref 0.7–4.0)
MCHC: 33.7 g/dL (ref 30.0–36.0)
MCV: 84.4 fl (ref 78.0–100.0)
Monocytes Absolute: 0.5 10*3/uL (ref 0.1–1.0)
Monocytes Relative: 8.9 % (ref 3.0–12.0)
NEUTROS ABS: 3.3 10*3/uL (ref 1.4–7.7)
NEUTROS PCT: 55.1 % (ref 43.0–77.0)
PLATELETS: 267 10*3/uL (ref 150.0–400.0)
RBC: 5.46 Mil/uL (ref 4.22–5.81)
RDW: 13.4 % (ref 11.5–15.5)
WBC: 5.9 10*3/uL (ref 4.0–10.5)

## 2017-02-18 LAB — POC URINALSYSI DIPSTICK (AUTOMATED)
Bilirubin, UA: NEGATIVE
Glucose, UA: NEGATIVE
Ketones, UA: NEGATIVE
Leukocytes, UA: NEGATIVE
NITRITE UA: NEGATIVE
PH UA: 6 (ref 5.0–8.0)
Protein, UA: NEGATIVE
Spec Grav, UA: 1.025 (ref 1.010–1.025)
Urobilinogen, UA: 0.2 E.U./dL

## 2017-02-18 LAB — PSA: PSA: 1.07 ng/mL (ref 0.10–4.00)

## 2017-02-18 MED ORDER — AMLODIPINE BESYLATE 5 MG PO TABS
5.0000 mg | ORAL_TABLET | Freq: Every day | ORAL | 3 refills | Status: DC
Start: 2017-02-18 — End: 2017-05-06

## 2017-02-18 NOTE — Assessment & Plan Note (Signed)
H/o this. Update UA today. Due for urology eval - in process of getting set up for this (some issue with residual balance at urology office)

## 2017-02-18 NOTE — Assessment & Plan Note (Signed)
Pt and wife desire definitive management - will refer to derm for eval.

## 2017-02-18 NOTE — Assessment & Plan Note (Signed)
New rhonchorous RUL. Second hand smoke exposure. Update CXR today.

## 2017-02-18 NOTE — Progress Notes (Addendum)
BP 130/64 (BP Location: Left Arm, Patient Position: Sitting, Cuff Size: Normal)   Pulse 80   Temp 97.9 F (36.6 C) (Oral)   Ht 5\' 11"  (1.803 m)   Wt 220 lb 4 oz (99.9 kg)   SpO2 97%   BMI 30.72 kg/m    CC: CPE Subjective:    [name], male    DOB: January 16, 1956, 62 y.o.   MRN: [id number]  HPI: Andre [name] is a 62 y.o. male presenting on 02/18/2017 for Annual Exam   H/o kidney stones. Some microhematuria recently. Due for f/u with urology.  Out of work last month due to R knee injury. It is feeling better now.   Preventative: COLONOSCOPY Date: 01/2012 tubular adenoma Ardis Hughs), rpt 5 yrs Prostate cancer screening - requests continued yearly. Flu shot - declines Td 2007, Tdap 2017 Seat belt use discussed. Sunscreen use discussed. No changing moles on skin.  Non smoker. Wife smokes.  Alcohol - occasional   Lives with wife, no pets Edu: 23 yr college Occupation: route salesman  Activity: no regular exercise, very involved in church Diet: good water, fruits/vegetables daily  Relevant past medical, surgical, family and social history reviewed and updated as indicated. Interim medical history since our last visit reviewed. Allergies and medications reviewed and updated. Outpatient Medications Prior to Visit  Medication Sig Dispense Refill  . Multiple Vitamins-Minerals (MULTIVITAMIN & MINERAL PO) Take 1 tablet by mouth daily.    Marland Kitchen amLODipine (NORVASC) 5 MG tablet TAKE ONE TABLET BY MOUTH ONCE DAILY 30 tablet 2   No facility-administered medications prior to visit.      Per HPI unless specifically indicated in ROS section below Review of Systems  Constitutional: Negative for activity change, appetite change, chills, fatigue, fever and unexpected weight change.  HENT: Negative for hearing loss.   Eyes: Negative for visual disturbance.  Respiratory: Negative for cough, chest tightness, shortness of breath and wheezing.   Cardiovascular: Negative for chest  pain, palpitations and leg swelling.  Gastrointestinal: Negative for abdominal distention, abdominal pain, blood in stool, constipation, diarrhea, nausea and vomiting.  Genitourinary: Negative for difficulty urinating and hematuria.  Musculoskeletal: Negative for arthralgias, myalgias and neck pain.  Skin: Negative for rash.  Neurological: Negative for dizziness, seizures, syncope and headaches.  Hematological: Negative for adenopathy. Does not bruise/bleed easily.  Psychiatric/Behavioral: Negative for dysphoric mood. The patient is not nervous/anxious.        Objective:    BP 130/64 (BP Location: Left Arm, Patient Position: Sitting, Cuff Size: Normal)   Pulse 80   Temp 97.9 F (36.6 C) (Oral)   Ht 5\' 11"  (1.803 m)   Wt 220 lb 4 oz (99.9 kg)   SpO2 97%   BMI 30.72 kg/m   Wt Readings from Last 3 Encounters:  02/18/17 220 lb 4 oz (99.9 kg)  04/23/16 220 lb 8 oz (100 kg)  12/14/15 221 lb (100.2 kg)    Physical Exam  Constitutional: He is oriented to person, place, and time. He appears well-developed and well-nourished. No distress.  HENT:  Head: Normocephalic and atraumatic.  Right Ear: Hearing, tympanic membrane, external ear and ear canal normal.  Left Ear: Hearing, tympanic membrane, external ear and ear canal normal.  Nose: Nose normal.  Mouth/Throat: Uvula is midline, oropharynx is clear and moist and mucous membranes are normal. No oropharyngeal exudate, posterior oropharyngeal edema or posterior oropharyngeal erythema.  Eyes: Conjunctivae and EOM are normal. Pupils are equal, round, and reactive to light. No scleral icterus.  Neck: Normal range of motion. Neck supple. No thyromegaly present.  Cardiovascular: Normal rate, regular rhythm, normal heart sounds and intact distal pulses.  No murmur heard. Pulses:      Radial pulses are 2+ on the right side, and 2+ on the left side.  Pulmonary/Chest: Effort normal. No respiratory distress. He has decreased breath sounds in the  right upper field. He has no wheezes. He has rhonchi (marked at RUL) in the right upper field. He has no rales.  Abdominal: Soft. Bowel sounds are normal. He exhibits no distension and no mass. There is no tenderness. There is no rebound and no guarding.  Genitourinary: Rectum normal and prostate normal. Rectal exam shows no external hemorrhoid, no internal hemorrhoid, no fissure, no mass, no tenderness and anal tone normal. Prostate is not enlarged (15gm) and not tender.  Musculoskeletal: Normal range of motion. He exhibits no edema.  Lymphadenopathy:    He has no cervical adenopathy.  Neurological: He is alert and oriented to person, place, and time.  CN grossly intact, station and gait intact  Skin: Skin is warm and dry. No rash noted.  L mid back with superficial fluctuant mass without erythema or tenderness, central pore  Psychiatric: He has a normal mood and affect. His behavior is normal. Judgment and thought content normal.  Nursing note and vitals reviewed.  Results for orders placed or performed in visit on 04/23/16  Urinalysis, microscopic only  Result Value Ref Range   WBC, UA 11-20/hpf (A) 0-2/hpf   RBC / HPF 3-6/hpf (A) 0-2/hpf   Mucus, UA Presence of (A) None   Squamous Epithelial / LPF Rare(0-4/hpf) Rare(0-4/hpf)   Sperm, UA Presence of (A) None  POCT Urinalysis Dipstick (Automated)  Result Value Ref Range   Color, UA yellow    Clarity, UA clear    Glucose, UA negative    Bilirubin, UA negative    Ketones, UA negative    Spec Grav, UA 1.030 1.030 - 1.035   Blood, UA negative    pH, UA 6.0 5.0 - 8.0   Protein, UA 1+    Urobilinogen, UA 0.2 Negative - 2.0   Nitrite, UA negative    Leukocytes, UA Negative Negative      Assessment & Plan:   Problem List Items Addressed This Visit    Abnormal breath sounds    New rhonchorous RUL. Second hand smoke exposure. Update CXR today.       Relevant Orders   DG Chest 2 View   Encounter for general adult medical  examination with abnormal findings - Primary    Preventative protocols reviewed and updated unless pt declined. Discussed healthy diet and lifestyle.       Epidermal inclusion cyst    Pt and wife desire definitive management - will refer to derm for eval.       Relevant Orders   Ambulatory referral to Dermatology   Essential hypertension, benign    Chronic, stable. Continue current regimen.       Relevant Medications   amLODipine (NORVASC) 5 MG tablet   Other Relevant Orders   Comprehensive metabolic panel   Gross hematuria    H/o this. Update UA today. Due for urology eval - in process of getting set up for this (some issue with residual balance at urology office)      Relevant Orders   CBC with Differential/Platelet    Other Visit Diagnoses    Special screening for malignant neoplasms, colon  Relevant Orders   Ambulatory referral to Gastroenterology   Special screening for malignant neoplasm of prostate       Relevant Orders   PSA       Follow up plan: Return in about 1 year (around 02/18/2018) for annual exam, prior fasting for blood work.  Ria Bush, MD

## 2017-02-18 NOTE — Addendum Note (Signed)
Addended by: Brenton Grills on: 8/87/1959 74:71 AM   Modules accepted: Orders

## 2017-02-18 NOTE — Patient Instructions (Addendum)
We will refer you back to GI.  Labs today.  Urinalysis today.  Chest xray today You are doing well today.  Return as needed or in 1 year for next physical.  Health Maintenance, Male A healthy lifestyle and preventive care is important for your health and wellness. Ask your health care provider about what schedule of regular examinations is right for you. What should I know about weight and diet? Eat a Healthy Diet  Eat plenty of vegetables, fruits, whole grains, low-fat dairy products, and lean protein.  Do not eat a lot of foods high in solid fats, added sugars, or salt.  Maintain a Healthy Weight Regular exercise can help you achieve or maintain a healthy weight. You should:  Do at least 150 minutes of exercise each week. The exercise should increase your heart rate and make you sweat (moderate-intensity exercise).  Do strength-training exercises at least twice a week.  Watch Your Levels of Cholesterol and Blood Lipids  Have your blood tested for lipids and cholesterol every 5 years starting at 60 years of age. If you are at high risk for heart disease, you should start having your blood tested when you are 60 years old. You may need to have your cholesterol levels checked more often if: ? Your lipid or cholesterol levels are high. ? You are older than 60 years of age. ? You are at high risk for heart disease.  What should I know about cancer screening? Many types of cancers can be detected early and may often be prevented. Lung Cancer  You should be screened every year for lung cancer if: ? You are a current smoker who has smoked for at least 30 years. ? You are a former smoker who has quit within the past 15 years.  Talk to your health care provider about your screening options, when you should start screening, and how often you should be screened.  Colorectal Cancer  Routine colorectal cancer screening usually begins at 60 years of age and should be repeated every 5-10  years until you are 60 years old. You may need to be screened more often if early forms of precancerous polyps or small growths are found. Your health care provider may recommend screening at an earlier age if you have risk factors for colon cancer.  Your health care provider may recommend using home test kits to check for hidden blood in the stool.  A small camera at the end of a tube can be used to examine your colon (sigmoidoscopy or colonoscopy). This checks for the earliest forms of colorectal cancer.  Prostate and Testicular Cancer  Depending on your age and overall health, your health care provider may do certain tests to screen for prostate and testicular cancer.  Talk to your health care provider about any symptoms or concerns you have about testicular or prostate cancer.  Skin Cancer  Check your skin from head to toe regularly.  Tell your health care provider about any new moles or changes in moles, especially if: ? There is a change in a mole's size, shape, or color. ? You have a mole that is larger than a pencil eraser.  Always use sunscreen. Apply sunscreen liberally and repeat throughout the day.  Protect yourself by wearing long sleeves, pants, a wide-brimmed hat, and sunglasses when outside.  What should I know about heart disease, diabetes, and high blood pressure?  If you are 3-66 years of age, have your blood pressure checked every 3-5 years. If  you are 7 years of age or older, have your blood pressure checked every year. You should have your blood pressure measured twice-once when you are at a hospital or clinic, and once when you are not at a hospital or clinic. Record the average of the two measurements. To check your blood pressure when you are not at a hospital or clinic, you can use: ? An automated blood pressure machine at a pharmacy. ? A home blood pressure monitor.  Talk to your health care provider about your target blood pressure.  If you are between  37-50 years old, ask your health care provider if you should take aspirin to prevent heart disease.  Have regular diabetes screenings by checking your fasting blood sugar level. ? If you are at a normal weight and have a low risk for diabetes, have this test once every three years after the age of 63. ? If you are overweight and have a high risk for diabetes, consider being tested at a younger age or more often.  A one-time screening for abdominal aortic aneurysm (AAA) by ultrasound is recommended for men aged 24-75 years who are current or former smokers. What should I know about preventing infection? Hepatitis B If you have a higher risk for hepatitis B, you should be screened for this virus. Talk with your health care provider to find out if you are at risk for hepatitis B infection. Hepatitis C Blood testing is recommended for:  Everyone born from [birth year] through 1963.  Anyone with known risk factors for hepatitis C.  Sexually Transmitted Diseases (STDs)  You should be screened each year for STDs including gonorrhea and chlamydia if: ? You are sexually active and are younger than 60 years of age. ? You are older than 60 years of age and your health care provider tells you that you are at risk for this type of infection. ? Your sexual activity has changed since you were last screened and you are at an increased risk for chlamydia or gonorrhea. Ask your health care provider if you are at risk.  Talk with your health care provider about whether you are at high risk of being infected with HIV. Your health care provider may recommend a prescription medicine to help prevent HIV infection.  What else can I do?  Schedule regular health, dental, and eye exams.  Stay current with your vaccines (immunizations).  Do not use any tobacco products, such as cigarettes, chewing tobacco, and e-cigarettes. If you need help quitting, ask your health care provider.  Limit alcohol intake to no more than  2 drinks per day. One drink equals 12 ounces of beer, 5 ounces of wine, or 1 ounces of hard liquor.  Do not use street drugs.  Do not share needles.  Ask your health care provider for help if you need support or information about quitting drugs.  Tell your health care provider if you often feel depressed.  Tell your health care provider if you have ever been abused or do not feel safe at home. This information is not intended to replace advice given to you by your health care provider. Make sure you discuss any questions you have with your health care provider. Document Released: 07/14/2007 Document Revised: 09/14/2015 Document Reviewed: 10/19/2014 Elsevier Interactive Patient Education  [employer].

## 2017-02-18 NOTE — Assessment & Plan Note (Signed)
Preventative protocols reviewed and updated unless pt declined. Discussed healthy diet and lifestyle.  

## 2017-02-18 NOTE — Assessment & Plan Note (Signed)
Chronic, stable. Continue current regimen. 

## 2017-02-18 NOTE — Assessment & Plan Note (Deleted)
Pt and wife desire definitive management - will refer to derm for eval.

## 2017-03-01 HISTORY — PX: COLONOSCOPY: SHX174

## 2017-03-11 ENCOUNTER — Ambulatory Visit (AMBULATORY_SURGERY_CENTER): Payer: Self-pay | Admitting: *Deleted

## 2017-03-11 ENCOUNTER — Other Ambulatory Visit: Payer: Self-pay

## 2017-03-11 VITALS — Ht 72.0 in | Wt 222.0 lb

## 2017-03-11 DIAGNOSIS — Z800 Family history of malignant neoplasm of digestive organs: Secondary | ICD-10-CM

## 2017-03-11 DIAGNOSIS — Z86010 Personal history of colonic polyps: Secondary | ICD-10-CM

## 2017-03-11 MED ORDER — PEG 3350-KCL-NA BICARB-NACL 420 G PO SOLR: 4000.0000 mL | Freq: Once | ORAL | 0 refills | Status: AC

## 2017-03-11 NOTE — Progress Notes (Signed)
Patient denies any allergies to eggs or soy. Patient denies any problems with anesthesia/sedation. Patient denies any oxygen use at home. Patient denies taking any diet/weight loss medications or blood thinners.  

## 2017-03-25 ENCOUNTER — Encounter: Payer: Self-pay | Admitting: Gastroenterology

## 2017-03-25 ENCOUNTER — Other Ambulatory Visit: Payer: Self-pay

## 2017-03-25 ENCOUNTER — Ambulatory Visit (AMBULATORY_SURGERY_CENTER): Payer: BLUE CROSS/BLUE SHIELD | Admitting: Gastroenterology

## 2017-03-25 VITALS — BP 133/88 | HR 83 | Temp 99.3°F | Resp 12 | Ht 72.0 in | Wt 222.0 lb

## 2017-03-25 DIAGNOSIS — Z86010 Personal history of colonic polyps: Secondary | ICD-10-CM | POA: Diagnosis present

## 2017-03-25 DIAGNOSIS — K621 Rectal polyp: Secondary | ICD-10-CM

## 2017-03-25 DIAGNOSIS — D128 Benign neoplasm of rectum: Secondary | ICD-10-CM

## 2017-03-25 DIAGNOSIS — K649 Unspecified hemorrhoids: Secondary | ICD-10-CM

## 2017-03-25 DIAGNOSIS — Z800 Family history of malignant neoplasm of digestive organs: Secondary | ICD-10-CM | POA: Diagnosis not present

## 2017-03-25 DIAGNOSIS — Z1211 Encounter for screening for malignant neoplasm of colon: Secondary | ICD-10-CM | POA: Diagnosis not present

## 2017-03-25 MED ORDER — SODIUM CHLORIDE 0.9 % IV SOLN: 500.0000 mL | Freq: Once | INTRAVENOUS | Status: DC

## 2017-03-25 NOTE — Patient Instructions (Addendum)
YOU HAD AN ENDOSCOPIC PROCEDURE TODAY AT THE [facility]:   Refer to the procedure report that was given to you for any specific questions about what was found during the examination.  If the procedure report does not answer your questions, please call your gastroenterologist to clarify.  If you requested that your care partner not be given the details of your procedure findings, then the procedure report has been included in a sealed envelope for you to review at your convenience later.  YOU SHOULD EXPECT: Some feelings of bloating in the abdomen. Passage of more gas than usual.  Walking can help get rid of the air that was put into your GI tract during the procedure and reduce the bloating. If you had a lower endoscopy (such as a colonoscopy or flexible sigmoidoscopy) you may notice spotting of blood in your stool or on the toilet paper. If you underwent a bowel prep for your procedure, you may not have a normal bowel movement for a few days.  Please Note:  You might notice some irritation and congestion in your nose or some drainage.  This is from the oxygen used during your procedure.  There is no need for concern and it should clear up in a day or so.  SYMPTOMS TO REPORT IMMEDIATELY:   Following lower endoscopy (colonoscopy or flexible sigmoidoscopy):  Excessive amounts of blood in the stool  Significant tenderness or worsening of abdominal pains  Swelling of the abdomen that is new, acute  Fever of 100F or higher   For urgent or emergent issues, a gastroenterologist can be reached at any hour by calling [phone].   DIET:  We do recommend a small meal at first, but then you may proceed to your regular diet.  Drink plenty of fluids but you should avoid alcoholic beverages for 24 hours.  ACTIVITY:  You should plan to take it easy for the rest of today and you should NOT DRIVE or use heavy machinery until tomorrow (because of the sedation medicines used during the test).     FOLLOW UP: Our staff will call the number listed on your records the next business day following your procedure to check on you and address any questions or concerns that you may have regarding the information given to you following your procedure. If we do not reach you, we will leave a message.  However, if you are feeling well and you are not experiencing any problems, there is no need to return our call.  We will assume that you have returned to your regular daily activities without incident.  If any biopsies were taken you will be contacted by phone or by letter within the next 1-3 weeks.  Please call us at [phone] if you have not heard about the biopsies in 3 weeks.    SIGNATURES/CONFIDENTIALITY: You and/or your care partner have signed paperwork which will be entered into your electronic medical record.  These signatures attest to the fact that that the information above on your After Visit Summary has been reviewed and is understood.  Full responsibility of the confidentiality of this discharge information lies with you and/or your care-partner.    Handouts were given to your care partner on polyps and hemorrhoids. You may resume your current medications today. Await biopsy results. Please call if any questions or concerns.   

## 2017-03-25 NOTE — Progress Notes (Signed)
A and O x3. Report to RN. Tolerated MAC anesthesia well.

## 2017-03-25 NOTE — Op Note (Signed)
[facility] Patient Name: Andre [name] Procedure Date: 03/25/2017 1:34 PM MRN: [id number] Endoscopist: Milus Banister , MD Age: 62 Referring MD:  Date of Birth: 06/06/1955 Gender: Male Account #: [id number] Procedure:                Colonoscopy Indications:              Screening in patient at increased risk: Family                            history of 1st-degree relative with colorectal                            cancer before age 76 years (sister in her 39's),                            also small tubular adenoma removed 2014 Medicines:                Monitored Anesthesia Care Procedure:                Pre-Anesthesia Assessment:                           - Prior to the procedure, a History and Physical                            was performed, and patient medications and                            allergies were reviewed. The patient's tolerance of                            previous anesthesia was also reviewed. The risks                            and benefits of the procedure and the sedation                            options and risks were discussed with the patient.                            All questions were answered, and informed consent                            was obtained. Prior Anticoagulants: The patient has                            taken no previous anticoagulant or antiplatelet                            agents. ASA Grade Assessment: II - A patient with                            mild systemic disease. After reviewing the risks  and benefits, the patient was deemed in                            satisfactory condition to undergo the procedure.                           After obtaining informed consent, the colonoscope                            was passed under direct vision. Throughout the                            procedure, the patient's blood pressure, pulse, and                            oxygen saturations were  monitored continuously. The                            Model CF-HQ190L [id number]) scope was introduced                            through the anus and advanced to the the cecum,                            identified by appendiceal orifice and ileocecal                            valve. The colonoscopy was performed without                            difficulty. The patient tolerated the procedure                            well. The quality of the bowel preparation was                            good. The ileocecal valve, appendiceal orifice, and                            rectum were photographed. Scope In: 1:39:20 PM Scope Out: 1:50:45 PM Scope Withdrawal Time: 0 hours 9 minutes 2 seconds  Total Procedure Duration: 0 hours 11 minutes 25 seconds  Findings:                 A 4 mm polyp was found in the rectum. The polyp was                            sessile. The polyp was removed with a cold snare.                            Resection and retrieval were complete.                           Internal hemorrhoids were found. The hemorrhoids  were medium-sized.                           The exam was otherwise without abnormality on                            direct and retroflexion views. Complications:            No immediate complications. Estimated blood loss:                            None. Estimated Blood Loss:     Estimated blood loss: none. Impression:               - One 4 mm polyp in the rectum, removed with a cold                            snare. Resected and retrieved.                           - Internal hemorrhoids.                           - The examination was otherwise normal on direct                            and retroflexion views. Recommendation:           - Patient has a contact number available for                            emergencies. The signs and symptoms of potential                            delayed complications were discussed  with the                            patient. Return to normal activities tomorrow.                            Written discharge instructions were provided to the                            patient.                           - Resume previous diet.                           - Continue present medications.                           You will receive a letter within 2-3 weeks with the                            pathology results and my final recommendations.  If the polyp(s) is proven to be 'pre-cancerous' on                            pathology, you will need repeat colonoscopy in 5                            years. Milus Banister, MD 03/25/2017 1:54:52 PM This report has been signed electronically.

## 2017-03-25 NOTE — Progress Notes (Signed)
Called to room to assist during endoscopic procedure.  Patient ID and intended procedure confirmed with present staff. Received instructions for my participation in the procedure from the performing physician.  

## 2017-03-25 NOTE — Progress Notes (Signed)
Pt's states no medical or surgical changes since previsit or office visit. 

## 2017-03-25 NOTE — Progress Notes (Signed)
No problems noted in the recovery room. maw 

## 2017-03-26 ENCOUNTER — Telehealth: Payer: Self-pay

## 2017-03-26 ENCOUNTER — Encounter: Payer: Self-pay | Admitting: Family Medicine

## 2017-03-26 NOTE — Telephone Encounter (Signed)
Patient called back states he is doing great and is back at work this morning.

## 2017-03-26 NOTE — Telephone Encounter (Signed)
Left message

## 2017-04-04 ENCOUNTER — Encounter: Payer: Self-pay | Admitting: Gastroenterology

## 2017-04-07 ENCOUNTER — Encounter: Payer: Self-pay | Admitting: Family Medicine

## 2017-05-06 ENCOUNTER — Other Ambulatory Visit: Payer: Self-pay | Admitting: Family Medicine

## 2017-05-06 MED ORDER — AMLODIPINE BESYLATE 5 MG PO TABS: 5.0000 mg | ORAL_TABLET | Freq: Every day | ORAL | 3 refills | Status: DC

## 2017-05-06 NOTE — Telephone Encounter (Signed)
[facility] Night - Client Nonclinical Telephone Record [facility] Night - Client Client Site [facility] Physician Ria Bush - MD Contact Type Call Who Is Calling Patient / Member / Family / Caregiver Caller Name Pros[place] Phone Number [phone] Patient Name Andre [name] Patient DOB [birth year] Call Type Message Only Information Provided Reason for Call Request for General Office Information Initial Comment Caller states he is needing a refill on his blood pressure medicine. He has enough through Monday. Additional Comment Call Closed By: Kelby Aline Transaction Date/Time: 05/04/2017 10:08:44 AM (ET)

## 2017-05-06 NOTE — Telephone Encounter (Signed)
Norvasc 5 mg tablet refill request needs a prior authorization  [place], [place]  Pt of Dr. Danise Mina

## 2017-05-06 NOTE — Telephone Encounter (Signed)
Noted  

## 2017-05-06 NOTE — Telephone Encounter (Signed)
Copied from [place]. Topic: Quick Communication - Rx Refill/Question >> May 06, 2017  8:34 AM Yvette Rack wrote: Medication: amLODipine (NORVASC) 5 MG tablet Has the patient contacted their pharmacy? Yes.   Pt states that it need prior authorization  (Agent: If no, request that the patient contact the pharmacy for the refill.) Preferred Pharmacy (with phone number or street name): [facility], [place] [phone] (Phone) [phone] (Fax)     Agent: Please be advised that RX refills may take up to 3 business days. We ask that you follow-up with your pharmacy.

## 2017-05-06 NOTE — Telephone Encounter (Signed)
Refilled agai,n although this was already sent in 01/2017 with enough refills for a year.  Future requests should come as refill request with med queued up, not phone encounter.

## 2017-12-05 ENCOUNTER — Ambulatory Visit: Payer: BLUE CROSS/BLUE SHIELD | Admitting: Family Medicine

## 2018-01-06 ENCOUNTER — Telehealth: Payer: Self-pay

## 2018-01-06 NOTE — Telephone Encounter (Signed)
Patient calls [facility] over the weekend (01/04/18) at 11:25am to report back spasms that have spread to his neck and shoulders.  He states it started with tight muscle in back and spread yesterday to neck and shoulders where he couldn't hold neck steady.  Cant lift head off bed and difficult to roll in bed or lift himself.  Triage nurse Loni Beckwith, RN contacted patient and states that he sounds very sick or weak and told him to go to the ED now.    Patient states that he would go to an urgent care or walk in clinic.

## 2018-01-06 NOTE — Telephone Encounter (Signed)
Do not see any documentation in Epic as to patient being seen but may have gone to an urgent care not reflected in Epic.  Left vague message on voice mail to please call us back.  Patient is to ask for Lattie Haw (Dr. Synthia Innocent CMA) when he calls back to update [place] on his condition and if he needs to be set up for a follow up with Dr. Danise Mina.  Appears patient cancelled his last appointment in November.  FYI to Dr. Danise Mina and Glorianne Manchester, CMA.

## 2018-01-09 NOTE — Progress Notes (Signed)
BP 122/76 (BP Location: Left Arm, Patient Position: Sitting, Cuff Size: Large)   Pulse 92   Temp 98.2 F (36.8 C) (Oral)   Ht 5\' 11"  (1.803 m)   Wt 220 lb (99.8 kg)   SpO2 97%   BMI 30.68 kg/m    CC: ST, malaise Subjective:    [name], male    DOB: Oct 28, 1955, 62 y.o.   MRN: [id number]  HPI: Andre [name] is a 62 y.o. male presenting on 01/10/2018 for Back Pain (C/o upper back pain across bilateral shoulders radiating into the neck. Says it feels like a tightness. Pain has improved but not resolved.  Also, c/o low left side back pain. Has been told he has a kidney stone but he cannot remember which kidney. ) and Sinus Problem (C/o sore throat, sinus congestion and drainage for last 2 days. )   1 wk ago felt tightness at base of neck and across shoulders when he got off work - very stiff and painful. This resolved on its own. Next morning felt left lower back pain/pressure without radiation. No dysuria, urgency, hematuria. Small increase in frequency. No abd pain, fevers/chills, flan pain, nausea/vomiting. Denies urethral discharge or swollen inguinal glands.   Wife worried about bump on back - he would like to see dermatologist for this.   2 d h/o ST, sinus congestion, drainage. No fevers, minimal cough, dyspnea or wheezing, ear or tooth pain, headache. Tried cough drops. ++ sick contacts at work.   ED - trouble obtaining and sustaining erections.   Relevant past medical, surgical, family and social history reviewed and updated as indicated. Interim medical history since our last visit reviewed. Allergies and medications reviewed and updated. Outpatient Medications Prior to Visit  Medication Sig Dispense Refill  . amLODipine (NORVASC) 5 MG tablet Take 1 tablet (5 mg total) by mouth daily. 90 tablet 3  . Multiple Vitamins-Minerals (MULTIVITAMIN & MINERAL PO) Take 1 tablet by mouth daily.    Marland Kitchen 0.9 %  sodium chloride infusion      No facility-administered  medications prior to visit.      Per HPI unless specifically indicated in ROS section below Review of Systems     Objective:    BP 122/76 (BP Location: Left Arm, Patient Position: Sitting, Cuff Size: Large)   Pulse 92   Temp 98.2 F (36.8 C) (Oral)   Ht 5\' 11"  (1.803 m)   Wt 220 lb (99.8 kg)   SpO2 97%   BMI 30.68 kg/m   Wt Readings from Last 3 Encounters:  01/10/18 220 lb (99.8 kg)  03/25/17 222 lb (100.7 kg)  03/11/17 222 lb (100.7 kg)    Physical Exam Vitals signs and nursing note reviewed.  Constitutional:      General: He is not in acute distress.    Appearance: He is well-developed.  HENT:     Head: Normocephalic and atraumatic.     Right Ear: Hearing, tympanic membrane, ear canal and external ear normal.     Left Ear: Hearing, tympanic membrane, ear canal and external ear normal.     Nose: Mucosal edema present. No rhinorrhea.     Right Sinus: No maxillary sinus tenderness or frontal sinus tenderness.     Left Sinus: No maxillary sinus tenderness or frontal sinus tenderness.     Mouth/Throat:     Pharynx: Uvula midline. No oropharyngeal exudate or posterior oropharyngeal erythema.     Tonsils: No tonsillar abscesses.  Eyes:  General: No scleral icterus.    Conjunctiva/sclera: Conjunctivae normal.     Pupils: Pupils are equal, round, and reactive to light.  Neck:     Musculoskeletal: Normal range of motion and neck supple.  Cardiovascular:     Rate and Rhythm: Normal rate and regular rhythm.     Heart sounds: Normal heart sounds. No murmur.  Pulmonary:     Effort: Pulmonary effort is normal. No respiratory distress.     Breath sounds: Normal breath sounds. No wheezing or rales.  Musculoskeletal: Normal range of motion.        General: No swelling.     Comments: No midline lumbar or lumbar paraspinous mm tenderness Neg SLR bilaterally  Lymphadenopathy:     Cervical: No cervical adenopathy.  Skin:    General: Skin is warm and dry.     Findings: No  rash.     Comments: Skin cyst L lower back  Psychiatric:        Mood and Affect: Mood normal.    Results for orders placed or performed in visit on 01/10/18  POCT Urinalysis Dipstick (Automated)  Result Value Ref Range   Color, UA straw    Clarity, UA clear    Glucose, UA Negative Negative   Bilirubin, UA negative    Ketones, UA negative    Spec Grav, UA <=1.005 (A) 1.010 - 1.025   Blood, UA +/-    pH, UA 6.5 5.0 - 8.0   Protein, UA Negative Negative   Urobilinogen, UA 0.2 0.2 or 1.0 E.U./dL   Nitrite, UA negative    Leukocytes, UA Small (1+) (A) Negative      Assessment & Plan:   Problem List Items Addressed This Visit    Viral URI    Anticipate viral given short duration. Supportive care reviewed as per patient instructions below.       Relevant Medications   sulfamethoxazole-trimethoprim (BACTRIM DS,SEPTRA DS) 800-160 MG tablet   Erectile dysfunction    ?UTI related - interested in treatment, but will first treat possible UTI      Epidermal inclusion cyst    Pt was unable to make previous derm appt - requests re referral today.       Relevant Orders   Ambulatory referral to Dermatology   Acute left-sided low back pain without sciatica - Primary    UA with 1+LE, micro reassuring but very dilute - will send culture and treat for possible UTI with 1 wk bactrim course. ?prostatitis - consider extending abx course.  Back exam otherwise reassuring.       Relevant Orders   POCT Urinalysis Dipstick (Automated) (Completed)   Urine Culture       Meds ordered this encounter  Medications  . sulfamethoxazole-trimethoprim (BACTRIM DS,SEPTRA DS) 800-160 MG tablet    Sig: Take 1 tablet by mouth 2 (two) times daily.    Dispense:  14 tablet    Refill:  0   Orders Placed This Encounter  Procedures  . Urine Culture  . Ambulatory referral to Dermatology    Referral Priority:   Routine    Referral Type:   Consultation    Referral Reason:   Specialty Services Required     Requested Specialty:   Dermatology    Number of Visits Requested:   1  . POCT Urinalysis Dipstick (Automated)   Patient Instructions  We will refer you to dermatology.  For back pain with frequency - treat with bactrim twice daily for 1 week. Let  us know if ongoing symptoms.  For congestion - you have a viral upper respiratory infection. Antibiotics are not needed for this.  Viral infections usually take 7-10 days to resolve.  The cough can last a few weeks to go away. Use medication as prescribed:  Push fluids and plenty of rest. Please return if you are not improving as expected, or if you have high fevers (>101.5) or difficulty swallowing or worsening productive cough. Call clinic with questions.  Good to see you today. I hope you start feeling better soon.    Follow up plan: Return if symptoms worsen or fail to improve.  Ria Bush, MD

## 2018-01-09 NOTE — Telephone Encounter (Signed)
Seeing patient tomorrow

## 2018-01-10 ENCOUNTER — Ambulatory Visit: Payer: BLUE CROSS/BLUE SHIELD | Admitting: Family Medicine

## 2018-01-10 ENCOUNTER — Encounter: Payer: Self-pay | Admitting: Family Medicine

## 2018-01-10 VITALS — BP 122/76 | HR 92 | Temp 98.2°F | Ht 71.0 in | Wt 220.0 lb

## 2018-01-10 DIAGNOSIS — M545 Low back pain, unspecified: Secondary | ICD-10-CM

## 2018-01-10 DIAGNOSIS — N529 Male erectile dysfunction, unspecified: Secondary | ICD-10-CM | POA: Diagnosis not present

## 2018-01-10 DIAGNOSIS — J069 Acute upper respiratory infection, unspecified: Secondary | ICD-10-CM | POA: Diagnosis not present

## 2018-01-10 DIAGNOSIS — L720 Epidermal cyst: Secondary | ICD-10-CM

## 2018-01-10 LAB — POC URINALSYSI DIPSTICK (AUTOMATED)
BILIRUBIN UA: NEGATIVE
Glucose, UA: NEGATIVE
Ketones, UA: NEGATIVE
Nitrite, UA: NEGATIVE
Protein, UA: NEGATIVE
Spec Grav, UA: <=1.005 — AB (ref 1.010–1.025)
Urobilinogen, UA: 0.2 E.U./dL
pH, UA: 6.5 (ref 5.0–8.0)

## 2018-01-10 MED ORDER — SULFAMETHOXAZOLE-TRIMETHOPRIM 800-160 MG PO TABS: 1.0000 | ORAL_TABLET | Freq: Two times a day (BID) | ORAL | 0 refills | Status: DC

## 2018-01-10 NOTE — Patient Instructions (Addendum)
We will refer you to dermatology.  For back pain with frequency - treat with bactrim twice daily for 1 week. Let us know if ongoing symptoms.  For congestion - you have a viral upper respiratory infection. Antibiotics are not needed for this.  Viral infections usually take 7-10 days to resolve.  The cough can last a few weeks to go away. Use medication as prescribed:  Push fluids and plenty of rest. Please return if you are not improving as expected, or if you have high fevers (>101.5) or difficulty swallowing or worsening productive cough. Call clinic with questions.  Good to see you today. I hope you start feeling better soon.

## 2018-01-11 DIAGNOSIS — M545 Low back pain, unspecified: Secondary | ICD-10-CM | POA: Insufficient documentation

## 2018-01-11 DIAGNOSIS — N529 Male erectile dysfunction, unspecified: Secondary | ICD-10-CM | POA: Insufficient documentation

## 2018-01-11 LAB — URINE CULTURE
MICRO NUMBER:: 91495520
Result:: NO GROWTH
SPECIMEN QUALITY:: ADEQUATE

## 2018-01-11 NOTE — Assessment & Plan Note (Signed)
Anticipate viral given short duration. Supportive care reviewed as per patient instructions below.

## 2018-01-11 NOTE — Assessment & Plan Note (Signed)
?  UTI related - interested in treatment, but will first treat possible UTI

## 2018-01-11 NOTE — Assessment & Plan Note (Addendum)
UA with 1+LE, micro reassuring but very dilute - will send culture and treat for possible UTI with 1 wk bactrim course. ?prostatitis - consider extending abx course.  Back exam otherwise reassuring.

## 2018-01-11 NOTE — Assessment & Plan Note (Signed)
Pt was unable to make previous derm appt - requests re referral today.

## 2018-01-17 ENCOUNTER — Telehealth: Payer: Self-pay

## 2018-01-17 NOTE — Telephone Encounter (Signed)
Left message for patient to call back in regards to a referral   

## 2018-02-14 DIAGNOSIS — L720 Epidermal cyst: Secondary | ICD-10-CM | POA: Diagnosis not present

## 2018-02-14 DIAGNOSIS — L708 Other acne: Secondary | ICD-10-CM | POA: Diagnosis not present

## 2018-03-06 ENCOUNTER — Encounter: Payer: Self-pay | Admitting: Family Medicine

## 2018-03-06 ENCOUNTER — Ambulatory Visit (INDEPENDENT_AMBULATORY_CARE_PROVIDER_SITE_OTHER): Payer: BLUE CROSS/BLUE SHIELD | Admitting: Family Medicine

## 2018-03-06 VITALS — BP 144/78 | HR 76 | Temp 98.1°F | Ht 71.0 in | Wt 225.0 lb

## 2018-03-06 DIAGNOSIS — I10 Essential (primary) hypertension: Secondary | ICD-10-CM | POA: Diagnosis not present

## 2018-03-06 DIAGNOSIS — R319 Hematuria, unspecified: Secondary | ICD-10-CM

## 2018-03-06 DIAGNOSIS — Z125 Encounter for screening for malignant neoplasm of prostate: Secondary | ICD-10-CM | POA: Diagnosis not present

## 2018-03-06 LAB — POCT URINALYSIS DIPSTICK
Bilirubin, UA: NEGATIVE
Glucose, UA: NEGATIVE
Ketones, UA: NEGATIVE
Leukocytes, UA: NEGATIVE
Nitrite, UA: NEGATIVE
Protein, UA: NEGATIVE
Spec Grav, UA: <=1.005 — AB (ref 1.010–1.025)
Urobilinogen, UA: 0.2 E.U./dL
pH, UA: 6 (ref 5.0–8.0)

## 2018-03-06 NOTE — Patient Instructions (Signed)
We will get more information today with more labs.    If you develop fevers, chills, nausea, vomiting, abdominal pain, flank pain which is severe >> consider going to the ER as this may be a sign of your kidney stone  If you start passing large clots or have some much bleeding that you feel lightheaded >> consider going to the ER  If you are unable to urinate or have difficulty urinating >> call back and return for appointment

## 2018-03-06 NOTE — Progress Notes (Signed)
Subjective:     Andre [name] is a 63 y.o. male presenting for Hematuria (noticed he was peeing blood last night. Today urine looks better. No pain, no burning iwth urination. No fever. No chills. No back pain.)     Hematuria  This is a new problem. The current episode started yesterday. The problem has been gradually improving since onset. He describes the hematuria as gross hematuria. The hematuria occurs throughout his entire urinary stream. He reports no clotting in his urine stream. He describes his urine color as dark red. Irritative symptoms do not include frequency, nocturia or urgency. Obstructive symptoms do not include dribbling, incomplete emptying or a weak stream. Pertinent negatives include no abdominal pain, chills, dysuria, fever, flank pain, genital pain, inability to urinate, nausea or vomiting. He is sexually active. His past medical history is significant for kidney stones. There is no history of prostatitis.   Has a physical job, but nothing out of the ordinary - does do some heavy lifting and may have lifted more than he should have yesterday.   Hx of kidney stone and told there was one that would no  Recently completed 14 days of antibiotics  Review of Systems  Constitutional: Negative for chills and fever.  Gastrointestinal: Negative for abdominal pain, nausea and vomiting.  Genitourinary: Positive for hematuria. Negative for dysuria, flank pain, frequency, incomplete emptying, nocturia and urgency.     Social History   Tobacco Use  Smoking Status Never Smoker  Smokeless Tobacco Never Used        Objective:    BP Readings from Last 3 Encounters:  03/06/18 (!) 144/78  01/10/18 122/76  03/25/17 133/88   Wt Readings from Last 3 Encounters:  03/06/18 225 lb (102.1 kg)  01/10/18 220 lb (99.8 kg)  03/25/17 222 lb (100.7 kg)    BP (!) 144/78   Pulse 76   Temp 98.1 F (36.7 C)   Ht 5\' 11"  (1.803 m)   Wt 225 lb (102.1 kg)   SpO2 97%   BMI  31.38 kg/m    Physical Exam Constitutional:      Appearance: Normal appearance. He is not ill-appearing or diaphoretic.  HENT:     Right Ear: External ear normal.     Left Ear: External ear normal.     Nose: Nose normal.  Eyes:     General: No scleral icterus.    Extraocular Movements: Extraocular movements intact.     Conjunctiva/sclera: Conjunctivae normal.  Neck:     Musculoskeletal: Neck supple.  Cardiovascular:     Rate and Rhythm: Normal rate and regular rhythm.     Heart sounds: Murmur present.  Pulmonary:     Effort: Pulmonary effort is normal. No respiratory distress.     Breath sounds: Normal breath sounds. No wheezing or rales.  Abdominal:     General: Abdomen is flat. Bowel sounds are normal. There is no distension.     Palpations: Abdomen is soft. There is no hepatomegaly.     Tenderness: There is no abdominal tenderness. There is no right CVA tenderness, left CVA tenderness, guarding or rebound.  Skin:    General: Skin is warm and dry.  Neurological:     Mental Status: He is alert. Mental status is at baseline.  Psychiatric:        Mood and Affect: Mood normal.           Assessment & Plan:   Problem List Items Addressed This Visit  Cardiovascular and Mediastinum   Essential hypertension, benign   Relevant Orders   Comprehensive metabolic panel   CBC with Differential     Other   Hematuria - Primary    Etiology unclear at this time. No other symptoms to guide diagnosis. No abdominal pain, no flank pain, no signs of BPH by history. Will obtain labs and urine micro to further assess. Continue to monitor and pending lab results consider referral      Relevant Orders   POCT urinalysis dipstick (Completed)   Urine Culture   Urinalysis, Routine w reflex microscopic   Comprehensive metabolic panel   PSA   CBC with Differential    Other Visit Diagnoses    Prostate cancer screening       Relevant Orders   PSA       Return if symptoms  worsen or fail to improve.  Lesleigh Noe, MD

## 2018-03-06 NOTE — Assessment & Plan Note (Signed)
Etiology unclear at this time. No other symptoms to guide diagnosis. No abdominal pain, no flank pain, no signs of BPH by history. Will obtain labs and urine micro to further assess. Continue to monitor and pending lab results consider referral

## 2018-03-07 ENCOUNTER — Telehealth: Payer: Self-pay | Admitting: Family Medicine

## 2018-03-07 ENCOUNTER — Other Ambulatory Visit: Payer: Self-pay | Admitting: Family Medicine

## 2018-03-07 DIAGNOSIS — R7309 Other abnormal glucose: Secondary | ICD-10-CM

## 2018-03-07 DIAGNOSIS — E7800 Pure hypercholesterolemia, unspecified: Secondary | ICD-10-CM

## 2018-03-07 DIAGNOSIS — R310 Gross hematuria: Secondary | ICD-10-CM

## 2018-03-07 DIAGNOSIS — I10 Essential (primary) hypertension: Secondary | ICD-10-CM

## 2018-03-07 LAB — CBC WITH DIFFERENTIAL/PLATELET
Basophils Absolute: 0.1 10*3/uL (ref 0.0–0.1)
Basophils Relative: 1 % (ref 0.0–3.0)
Eosinophils Absolute: 0.1 10*3/uL (ref 0.0–0.7)
Eosinophils Relative: 1.2 % (ref 0.0–5.0)
HCT: 45.2 % (ref 39.0–52.0)
Hemoglobin: 15.2 g/dL (ref 13.0–17.0)
Lymphocytes Relative: 32.6 % (ref 12.0–46.0)
Lymphs Abs: 2.1 10*3/uL (ref 0.7–4.0)
MCHC: 33.5 g/dL (ref 30.0–36.0)
MCV: 83.4 fl (ref 78.0–100.0)
Monocytes Absolute: 0.6 10*3/uL (ref 0.1–1.0)
Monocytes Relative: 9.1 % (ref 3.0–12.0)
Neutro Abs: 3.7 10*3/uL (ref 1.4–7.7)
Neutrophils Relative %: 56.1 % (ref 43.0–77.0)
Platelets: 265 10*3/uL (ref 150.0–400.0)
RBC: 5.42 Mil/uL (ref 4.22–5.81)
RDW: 13.2 % (ref 11.5–15.5)
WBC: 6.5 10*3/uL (ref 4.0–10.5)

## 2018-03-07 LAB — URINALYSIS, ROUTINE W REFLEX MICROSCOPIC
Bilirubin Urine: NEGATIVE
Ketones, ur: NEGATIVE
NITRITE: NEGATIVE
Specific Gravity, Urine: <=1.005 — AB (ref 1.000–1.030)
Total Protein, Urine: NEGATIVE
URINE GLUCOSE: NEGATIVE
Urobilinogen, UA: 0.2 (ref 0.0–1.0)
pH: 6.5 (ref 5.0–8.0)

## 2018-03-07 LAB — COMPREHENSIVE METABOLIC PANEL
ALT: 20 U/L (ref 0–53)
AST: 17 U/L (ref 0–37)
Albumin: 4.7 g/dL (ref 3.5–5.2)
Alkaline Phosphatase: 78 U/L (ref 39–117)
BUN: 11 mg/dL (ref 6–23)
CO2: 29 mEq/L (ref 19–32)
Calcium: 9.4 mg/dL (ref 8.4–10.5)
Chloride: 103 mEq/L (ref 96–112)
Creatinine, Ser: 0.79 mg/dL (ref 0.40–1.50)
GFR: 99.91 mL/min (ref >60.00)
Glucose, Bld: 71 mg/dL (ref 70–99)
POTASSIUM: 3.6 meq/L (ref 3.5–5.1)
SODIUM: 140 meq/L (ref 135–145)
Total Bilirubin: 0.6 mg/dL (ref 0.2–1.2)
Total Protein: 7.2 g/dL (ref 6.0–8.3)

## 2018-03-07 LAB — URINE CULTURE
MICRO NUMBER:: 161193
Result:: NO GROWTH
SPECIMEN QUALITY:: ADEQUATE

## 2018-03-07 LAB — PSA: PSA: 1.19 ng/mL (ref 0.10–4.00)

## 2018-03-07 NOTE — Telephone Encounter (Signed)
MyChart to patient  Referral placed for urology  FYI to pcp

## 2018-03-09 NOTE — Telephone Encounter (Signed)
Noted. Thanks.

## 2018-03-11 ENCOUNTER — Other Ambulatory Visit: Payer: BLUE CROSS/BLUE SHIELD

## 2018-03-14 ENCOUNTER — Encounter: Payer: BLUE CROSS/BLUE SHIELD | Admitting: Family Medicine

## 2018-03-25 NOTE — Telephone Encounter (Signed)
Spoke with patient . Relayed all the information. Patient has an appointment with Dr. Linton Ham, on 05/02/2018

## 2018-03-25 NOTE — Telephone Encounter (Signed)
Left message for patient to call back. Urology appointment already set up

## 2018-03-25 NOTE — Telephone Encounter (Signed)
Notified that MyChart message was not read.   Routing to MA to follow-up with patient to make sure he has received the message.   fyi to PCP

## 2018-04-02 ENCOUNTER — Ambulatory Visit (INDEPENDENT_AMBULATORY_CARE_PROVIDER_SITE_OTHER): Payer: BLUE CROSS/BLUE SHIELD | Admitting: Family Medicine

## 2018-04-02 ENCOUNTER — Encounter: Payer: Self-pay | Admitting: Family Medicine

## 2018-04-02 VITALS — BP 136/78 | HR 83 | Temp 98.0°F | Ht 71.0 in | Wt 223.6 lb

## 2018-04-02 DIAGNOSIS — R7309 Other abnormal glucose: Secondary | ICD-10-CM | POA: Diagnosis not present

## 2018-04-02 DIAGNOSIS — I10 Essential (primary) hypertension: Secondary | ICD-10-CM

## 2018-04-02 DIAGNOSIS — Z0000 Encounter for general adult medical examination without abnormal findings: Secondary | ICD-10-CM

## 2018-04-02 DIAGNOSIS — E7800 Pure hypercholesterolemia, unspecified: Secondary | ICD-10-CM

## 2018-04-02 DIAGNOSIS — R310 Gross hematuria: Secondary | ICD-10-CM

## 2018-04-02 LAB — LIPID PANEL
Cholesterol: 190 mg/dL (ref 0–200)
HDL: 52.1 mg/dL (ref >39.00)
NonHDL: 137.89
Total CHOL/HDL Ratio: 4
Triglycerides: 209 mg/dL — ABNORMAL HIGH (ref 0.0–149.0)
VLDL: 41.8 mg/dL — ABNORMAL HIGH (ref 0.0–40.0)

## 2018-04-02 LAB — POC URINALSYSI DIPSTICK (AUTOMATED)
Bilirubin, UA: NEGATIVE
Glucose, UA: NEGATIVE
Ketones, UA: NEGATIVE
NITRITE UA: NEGATIVE
Protein, UA: NEGATIVE
Spec Grav, UA: 1.015 (ref 1.010–1.025)
Urobilinogen, UA: 0.2 E.U./dL
pH, UA: 6 (ref 5.0–8.0)

## 2018-04-02 LAB — LDL CHOLESTEROL, DIRECT: Direct LDL: 116 mg/dL

## 2018-04-02 MED ORDER — AMLODIPINE BESYLATE 5 MG PO TABS: 5.0000 mg | ORAL_TABLET | Freq: Every day | ORAL | 3 refills | Status: DC

## 2018-04-02 NOTE — Assessment & Plan Note (Signed)
Check FLP today. Not on statin.  The 10-year ASCVD risk score Andre [name] DC Andre [name]., et al., 2013) is: 8.8%   Values used to calculate the score:     Age: 63 years     Sex: Male     Is Non-Hispanic African American: No     Diabetic: No     Tobacco smoker: No     Systolic Blood Pressure: 100 mmHg     Is BP treated: Yes     HDL Cholesterol: 58.2 mg/dL     Total Cholesterol: 174 mg/dL

## 2018-04-02 NOTE — Progress Notes (Signed)
BP 136/78 (BP Location: Left Arm, Patient Position: Sitting, Cuff Size: Large)   Pulse 83   Temp 98 F (36.7 C) (Oral)   Ht 5\' 11"  (1.803 m)   Wt 223 lb 9 oz (101.4 kg)   SpO2 96%   BMI 31.18 kg/m    CC: CPE Subjective:    [name], male    DOB: [birth year], 63 y.o.   MRN: [id number]  HPI: Andre [name] is a 63 y.o. male presenting on 04/02/2018 for Annual Exam (Pt accompanied by his wife, Jolet.)   Recent episode of gross hematuria saw Dr Einar Pheasant with hematuria confirmed on UA. Urology appt scheduled 04/2018. He did have possible prostatitis treated with bactrim 1 wk course 12/2017. UCx at that time negative.   Saw Dr [place] Crane for sebaceous cyst. Happy with results.   Preventative: COLONOSCOPY Date: 01/2012 tubular adenoma Ardis Hughs), rpt 5 yrs COLONOSCOPY 03/2017 - hyperplastic polyp, int hem, rpt 5 yrs Ardis Hughs) Prostate cancer screening - requests continuedyearly. Recent PSA reassuring.  Flu shot - declines Td 2007, Tdap 2017 Shingrix - discussed.  Advanced directive:  Seat belt use discussed. Sunscreen use discussed. No changing moles on skin.  Non smoker. Wife smokes.  Alcohol - occasional.  Dentist q6 mo  Eye exam due  Lives with wife, no pets Edu: 29 yr college Occupation: route salesman  Activity: no regular exercise, very involved in church  Diet: good water, fruits/vegetables daily     Relevant past medical, surgical, family and social history reviewed and updated as indicated. Interim medical history since our last visit reviewed. Allergies and medications reviewed and updated. Outpatient Medications Prior to Visit  Medication Sig Dispense Refill  . Multiple Vitamins-Minerals (MULTIVITAMIN & MINERAL PO) Take 1 tablet by mouth daily.    Marland Kitchen amLODipine (NORVASC) 5 MG tablet Take 1 tablet (5 mg total) by mouth daily. 90 tablet 3   No facility-administered medications prior to visit.      Per HPI unless specifically indicated in ROS section  below Review of Systems  Constitutional: Negative for activity change, appetite change, chills, fatigue, fever and unexpected weight change.  HENT: Negative for hearing loss.   Eyes: Negative for visual disturbance.  Respiratory: Negative for cough, chest tightness, shortness of breath and wheezing.   Cardiovascular: Negative for chest pain, palpitations and leg swelling.  Gastrointestinal: Negative for abdominal distention, abdominal pain, blood in stool, constipation, diarrhea, nausea and vomiting.  Genitourinary: Negative for difficulty urinating and hematuria.  Musculoskeletal: Negative for arthralgias, myalgias and neck pain.  Skin: Negative for rash.  Neurological: Negative for dizziness, seizures, syncope and headaches.  Hematological: Negative for adenopathy. Does not bruise/bleed easily.  Psychiatric/Behavioral: Negative for dysphoric mood. The patient is not nervous/anxious.    Objective:    BP 136/78 (BP Location: Left Arm, Patient Position: Sitting, Cuff Size: Large)   Pulse 83   Temp 98 F (36.7 C) (Oral)   Ht 5\' 11"  (1.803 m)   Wt 223 lb 9 oz (101.4 kg)   SpO2 96%   BMI 31.18 kg/m   Wt Readings from Last 3 Encounters:  04/02/18 223 lb 9 oz (101.4 kg)  03/06/18 225 lb (102.1 kg)  01/10/18 220 lb (99.8 kg)    Physical Exam Vitals signs and nursing note reviewed.  Constitutional:      General: He is not in acute distress.    Appearance: He is well-developed.  HENT:     Head: Normocephalic and atraumatic.     Right Ear:  Hearing, tympanic membrane, ear canal and external ear normal.     Left Ear: Hearing, tympanic membrane, ear canal and external ear normal.     Nose: Nose normal.     Mouth/Throat:     Mouth: Mucous membranes are moist.     Pharynx: Uvula midline. No oropharyngeal exudate or posterior oropharyngeal erythema.  Eyes:     General: No scleral icterus.    Conjunctiva/sclera: Conjunctivae normal.     Pupils: Pupils are equal, round, and reactive to  light.  Neck:     Musculoskeletal: Normal range of motion and neck supple.  Cardiovascular:     Rate and Rhythm: Normal rate and regular rhythm.     Pulses: Normal pulses.          Radial pulses are 2+ on the right side and 2+ on the left side.     Heart sounds: Normal heart sounds. No murmur.  Pulmonary:     Effort: Pulmonary effort is normal. No respiratory distress.     Breath sounds: Normal breath sounds. No wheezing, rhonchi or rales.  Abdominal:     General: Bowel sounds are normal. There is no distension.     Palpations: Abdomen is soft. There is no mass.     Tenderness: There is no abdominal tenderness. There is no guarding or rebound.  Genitourinary:    Prostate: Normal. Not enlarged (20gm), not tender and no nodules present.     Rectum: Normal. No mass, tenderness, anal fissure, external hemorrhoid or internal hemorrhoid. Normal anal tone.  Musculoskeletal: Normal range of motion.  Lymphadenopathy:     Cervical: No cervical adenopathy.  Skin:    General: Skin is warm and dry.     Findings: No rash.  Neurological:     Mental Status: He is alert and oriented to person, place, and time.     Comments: CN grossly intact, station and gait intact  Psychiatric:        Behavior: Behavior normal.        Thought Content: Thought content normal.        Judgment: Judgment normal.       Results for orders placed or performed in visit on 04/02/18  POCT Urinalysis Dipstick (Automated)  Result Value Ref Range   Color, UA lt yellow    Clarity, UA clear    Glucose, UA Negative Negative   Bilirubin, UA negative    Ketones, UA negative    Spec Grav, UA 1.015 1.010 - 1.025   Blood, UA 1+    pH, UA 6.0 5.0 - 8.0   Protein, UA Negative Negative   Urobilinogen, UA 0.2 0.2 or 1.0 E.U./dL   Nitrite, UA negative    Leukocytes, UA Small (1+) (A) Negative  Micro: WBC rare RBC 3-5 Bact tr Casts none Epi none  Assessment & Plan:   Problem List Items Addressed This Visit     HYPERGLYCEMIA    Recent glu normal.       HYPERCHOLESTEROLEMIA    Check FLP today. Not on statin.  The 10-year ASCVD risk score Mikey Bussing DC Brooke Bonito., et al., 2013) is: 8.8%   Values used to calculate the score:     Age: 52 years     Sex: Male     Is Non-Hispanic African American: No     Diabetic: No     Tobacco smoker: No     Systolic Blood Pressure: 342 mmHg     Is BP treated: Yes  HDL Cholesterol: 58.2 mg/dL     Total Cholesterol: 174 mg/dL       Relevant Medications   amLODipine (NORVASC) 5 MG tablet   Health maintenance examination - Primary    Preventative protocols reviewed and updated unless pt declined. Discussed healthy diet and lifestyle.       Gross hematuria    Update UA. Pending uro appt next month.       Relevant Orders   POCT Urinalysis Dipstick (Automated) (Completed)   Essential hypertension, benign    Chronic, stable. Continue current regimen.       Relevant Medications   amLODipine (NORVASC) 5 MG tablet       Meds ordered this encounter  Medications  . amLODipine (NORVASC) 5 MG tablet    Sig: Take 1 tablet (5 mg total) by mouth daily.    Dispense:  90 tablet    Refill:  3   Orders Placed This Encounter  Procedures  . POCT Urinalysis Dipstick (Automated)    Follow up plan: Return in about 1 year (around 04/02/2019) for annual exam, prior fasting for blood work.  Ria Bush, MD

## 2018-04-02 NOTE — Assessment & Plan Note (Signed)
Recent glu normal.

## 2018-04-02 NOTE — Assessment & Plan Note (Signed)
Chronic, stable. Continue current regimen. 

## 2018-04-02 NOTE — Patient Instructions (Addendum)
Labs today Repeat urinalysis today.  If interested in new 2 shot shingles series (shingrix) let us know.  You are doing well today. Return as needed or in 1 year for next physical.   Health Maintenance, Male A healthy lifestyle and preventive care is important for your health and wellness. Ask your health care provider about what schedule of regular examinations is right for you. What should I know about weight and diet? Eat a Healthy Diet  Eat plenty of vegetables, fruits, whole grains, low-fat dairy products, and lean protein.  Do not eat a lot of foods high in solid fats, added sugars, or salt.  Maintain a Healthy Weight Regular exercise can help you achieve or maintain a healthy weight. You should:  Do at least 150 minutes of exercise each week. The exercise should increase your heart rate and make you sweat (moderate-intensity exercise).  Do strength-training exercises at least twice a week. Watch Your Levels of Cholesterol and Blood Lipids  Have your blood tested for lipids and cholesterol every 5 years starting at 61 years of age. If you are at high risk for heart disease, you should start having your blood tested when you are 61 years old. You may need to have your cholesterol levels checked more often if: ? Your lipid or cholesterol levels are high. ? You are older than 61 years of age. ? You are at high risk for heart disease. What should I know about cancer screening? Many types of cancers can be detected early and may often be prevented. Lung Cancer  You should be screened every year for lung cancer if: ? You are a current smoker who has smoked for at least 30 years. ? You are a former smoker who has quit within the past 15 years.  Talk to your health care provider about your screening options, when you should start screening, and how often you should be screened. Colorectal Cancer  Routine colorectal cancer screening usually begins at 61 years of age and should be  repeated every 5-10 years until you are 61 years old. You may need to be screened more often if early forms of precancerous polyps or small growths are found. Your health care provider may recommend screening at an earlier age if you have risk factors for colon cancer.  Your health care provider may recommend using home test kits to check for hidden blood in the stool.  A small camera at the end of a tube can be used to examine your colon (sigmoidoscopy or colonoscopy). This checks for the earliest forms of colorectal cancer. Prostate and Testicular Cancer  Depending on your age and overall health, your health care provider may do certain tests to screen for prostate and testicular cancer.  Talk to your health care provider about any symptoms or concerns you have about testicular or prostate cancer. Skin Cancer  Check your skin from head to toe regularly.  Tell your health care provider about any new moles or changes in moles, especially if: ? There is a change in a mole's size, shape, or color. ? You have a mole that is larger than a pencil eraser.  Always use sunscreen. Apply sunscreen liberally and repeat throughout the day.  Protect yourself by wearing long sleeves, pants, a wide-brimmed hat, and sunglasses when outside. What should I know about heart disease, diabetes, and high blood pressure?  If you are 38-54 years of age, have your blood pressure checked every 3-5 years. If you are 40 years  of age or older, have your blood pressure checked every year. You should have your blood pressure measured twice-once when you are at a hospital or clinic, and once when you are not at a hospital or clinic. Record the average of the two measurements. To check your blood pressure when you are not at a hospital or clinic, you can use: ? An automated blood pressure machine at a pharmacy. ? A home blood pressure monitor.  Talk to your health care provider about your target blood pressure.  If you  are between 42-15 years old, ask your health care provider if you should take aspirin to prevent heart disease.  Have regular diabetes screenings by checking your fasting blood sugar level. ? If you are at a normal weight and have a low risk for diabetes, have this test once every three years after the age of 57. ? If you are overweight and have a high risk for diabetes, consider being tested at a younger age or more often.  A one-time screening for abdominal aortic aneurysm (AAA) by ultrasound is recommended for men aged 60-75 years who are current or former smokers. What should I know about preventing infection? Hepatitis B If you have a higher risk for hepatitis B, you should be screened for this virus. Talk with your health care provider to find out if you are at risk for hepatitis B infection. Hepatitis C Blood testing is recommended for:  Everyone born from [birth year] through 1963.  Anyone with known risk factors for hepatitis C. Sexually Transmitted Diseases (STDs)  You should be screened each year for STDs including gonorrhea and chlamydia if: ? You are sexually active and are younger than 61 years of age. ? You are older than 61 years of age and your health care provider tells you that you are at risk for this type of infection. ? Your sexual activity has changed since you were last screened and you are at an increased risk for chlamydia or gonorrhea. Ask your health care provider if you are at risk.  Talk with your health care provider about whether you are at high risk of being infected with HIV. Your health care provider may recommend a prescription medicine to help prevent HIV infection. What else can I do?  Schedule regular health, dental, and eye exams.  Stay current with your vaccines (immunizations).  Do not use any tobacco products, such as cigarettes, chewing tobacco, and e-cigarettes. If you need help quitting, ask your health care provider.  Limit alcohol intake to no  more than 2 drinks per day. One drink equals 12 ounces of beer, 5 ounces of wine, or 1 ounces of hard liquor.  Do not use street drugs.  Do not share needles.  Ask your health care provider for help if you need support or information about quitting drugs.  Tell your health care provider if you often feel depressed.  Tell your health care provider if you have ever been abused or do not feel safe at home. This information is not intended to replace advice given to you by your health care provider. Make sure you discuss any questions you have with your health care provider. Document Released: 07/14/2007 Document Revised: 09/14/2015 Document Reviewed: 10/19/2014 Elsevier Interactive Patient Education  2019 [employer].

## 2018-04-02 NOTE — Assessment & Plan Note (Signed)
Preventative protocols reviewed and updated unless pt declined. Discussed healthy diet and lifestyle.  

## 2018-04-02 NOTE — Assessment & Plan Note (Addendum)
Update UA. Pending uro appt next month.

## 2018-08-28 ENCOUNTER — Emergency Department (HOSPITAL_BASED_OUTPATIENT_CLINIC_OR_DEPARTMENT_OTHER)
Admission: EM | Admit: 2018-08-28 | Discharge: 2018-08-28 | Disposition: A | Discharge status: Home / Self Care | Payer: Self-pay | Attending: Emergency Medicine | Admitting: Emergency Medicine

## 2018-08-28 ENCOUNTER — Other Ambulatory Visit: Payer: Self-pay

## 2018-08-28 ENCOUNTER — Telehealth: Payer: Self-pay

## 2018-08-28 ENCOUNTER — Emergency Department (HOSPITAL_BASED_OUTPATIENT_CLINIC_OR_DEPARTMENT_OTHER): Payer: Self-pay

## 2018-08-28 DIAGNOSIS — N201 Calculus of ureter: Secondary | ICD-10-CM | POA: Insufficient documentation

## 2018-08-28 DIAGNOSIS — R109 Unspecified abdominal pain: Secondary | ICD-10-CM | POA: Diagnosis not present

## 2018-08-28 DIAGNOSIS — I10 Essential (primary) hypertension: Secondary | ICD-10-CM | POA: Insufficient documentation

## 2018-08-28 DIAGNOSIS — Z79899 Other long term (current) drug therapy: Secondary | ICD-10-CM | POA: Insufficient documentation

## 2018-08-28 LAB — CBC WITH DIFFERENTIAL/PLATELET
Abs Immature Granulocytes: 0.04 10*3/uL (ref 0.00–0.07)
Basophils Absolute: 0 10*3/uL (ref 0.0–0.1)
Basophils Relative: 0 %
Eosinophils Absolute: 0.1 10*3/uL (ref 0.0–0.5)
Eosinophils Relative: 1 %
HCT: 47.3 % (ref 39.0–52.0)
Hemoglobin: 15.6 g/dL (ref 13.0–17.0)
Immature Granulocytes: 0 %
Lymphocytes Relative: 17 %
Lymphs Abs: 2.1 10*3/uL (ref 0.7–4.0)
MCH: 28.1 pg (ref 26.0–34.0)
MCHC: 33 g/dL (ref 30.0–36.0)
MCV: 85.1 fL (ref 80.0–100.0)
Monocytes Absolute: 1.1 10*3/uL — ABNORMAL HIGH (ref 0.1–1.0)
Monocytes Relative: 9 %
Neutro Abs: 8.7 10*3/uL — ABNORMAL HIGH (ref 1.7–7.7)
Neutrophils Relative %: 73 %
Platelets: 250 10*3/uL (ref 150–400)
RBC: 5.56 MIL/uL (ref 4.22–5.81)
RDW: 13.1 % (ref 11.5–15.5)
WBC: 12.1 10*3/uL — ABNORMAL HIGH (ref 4.0–10.5)
nRBC: 0 % (ref 0.0–0.2)

## 2018-08-28 LAB — URINALYSIS, MICROSCOPIC (REFLEX)

## 2018-08-28 LAB — BASIC METABOLIC PANEL
Anion gap: 14 (ref 5–15)
BUN: 13 mg/dL (ref 6–20)
CO2: 23 mmol/L (ref 22–32)
Calcium: 9.2 mg/dL (ref 8.9–10.3)
Chloride: 103 mmol/L (ref 98–111)
Creatinine, Ser: 0.81 mg/dL (ref 0.61–1.24)
GFR calc Af Amer: >60 mL/min (ref >60)
GFR calc non Af Amer: >60 mL/min (ref >60)
Glucose, Bld: 127 mg/dL — ABNORMAL HIGH (ref 70–99)
Potassium: 3.6 mmol/L (ref 3.5–5.1)
Sodium: 140 mmol/L (ref 135–145)

## 2018-08-28 LAB — URINALYSIS, ROUTINE W REFLEX MICROSCOPIC
Bilirubin Urine: NEGATIVE
Glucose, UA: NEGATIVE mg/dL
Ketones, ur: NEGATIVE mg/dL
Leukocytes,Ua: NEGATIVE
Nitrite: NEGATIVE
Protein, ur: NEGATIVE mg/dL
Specific Gravity, Urine: 1.025 (ref 1.005–1.030)
pH: 6 (ref 5.0–8.0)

## 2018-08-28 MED ORDER — ONDANSETRON HCL 4 MG/2ML IJ SOLN
4.0000 mg | Freq: Once | INTRAMUSCULAR | Status: AC
  Administered 2018-08-28: 4 mg via INTRAVENOUS
  Filled 2018-08-28: qty 2

## 2018-08-28 MED ORDER — HYDROCODONE-ACETAMINOPHEN 5-325 MG PO TABS: 1.0000 | ORAL_TABLET | ORAL | 0 refills | Status: DC | PRN

## 2018-08-28 MED ORDER — HYDROMORPHONE HCL 1 MG/ML IJ SOLN
1.0000 mg | Freq: Once | INTRAMUSCULAR | Status: AC
  Administered 2018-08-28: 1 mg via INTRAVENOUS
  Filled 2018-08-28: qty 1

## 2018-08-28 MED ORDER — KETOROLAC TROMETHAMINE 15 MG/ML IJ SOLN
15.0000 mg | Freq: Once | INTRAMUSCULAR | Status: AC
  Administered 2018-08-28: 15 mg via INTRAVENOUS
  Filled 2018-08-28: qty 1

## 2018-08-28 MED ORDER — SODIUM CHLORIDE 0.9 % IV BOLUS
1000.0000 mL | Freq: Once | INTRAVENOUS | Status: AC
  Administered 2018-08-28: 1000 mL via INTRAVENOUS

## 2018-08-28 MED ORDER — MORPHINE SULFATE (PF) 4 MG/ML IV SOLN
4.0000 mg | Freq: Once | INTRAVENOUS | Status: AC
  Administered 2018-08-28: 4 mg via INTRAVENOUS
  Filled 2018-08-28: qty 1

## 2018-08-28 MED FILL — HYDROCODON-APAP 5-325: 5-325 | 3 days supply | Qty: 15 | Fill #0

## 2018-08-28 NOTE — Telephone Encounter (Signed)
Noted  

## 2018-08-28 NOTE — ED Notes (Signed)
Patient transported to CT 

## 2018-08-28 NOTE — ED Notes (Signed)
MD at bedside to discuss CT results.  Medicated for pain and nausea.  Pt aware of need for urine specimen, provided urinal.

## 2018-08-28 NOTE — Telephone Encounter (Signed)
CT showing L ureteral stone plz call tomorrow for an update - anticipate he will f/u with urology.

## 2018-08-28 NOTE — ED Provider Notes (Signed)
[facility] EMERGENCY DEPARTMENT Provider Note   CSN: [id number] Arrival date & time: 08/28/18  0254    History   Chief Complaint Chief Complaint  Patient presents with   Flank Pain    HPI Andre [name] is a 63 y.o. male.      HPI  63 year old male presents with left flank pain.  Feels similar to when he had a kidney stone a couple years ago and was told there was another one in his kidney.  No vomiting but since arriving to the ER he is feeling nauseated.  Started around 3 AM this morning.  Some difficulty urinating but no pain with urination.  No fevers.  Pain is about 8 out of 10.  Past Medical History:  Diagnosis Date   Essential hypertension, benign    History of colon polyps 2014   Nephrolithiasis 06/2014   ER visit   Pure hypercholesterolemia     Patient Active Problem List   Diagnosis Date Noted   Acute left-sided low back pain without sciatica 01/11/2018   Erectile dysfunction 01/11/2018   Epidermal inclusion cyst 12/13/2014   Gross hematuria 05/03/2014   Achilles tendon pain 12/31/2013   Rosacea 04/20/2010   Health maintenance examination 04/20/2010   HYPERGLYCEMIA 09/06/2008   Essential hypertension, benign 12/12/2006   HYPERCHOLESTEROLEMIA 11/25/2006    Past Surgical History:  Procedure Laterality Date   COLONOSCOPY  01/2012   tubular adenoma Ardis Hughs), rpt 5 yrs   COLONOSCOPY  03/2017   hyperplastic polyp, int hem, rpt 5 yrs Ardis Hughs)   TONSILLECTOMY     as a child        Home Medications    Prior to Admission medications   Medication Sig Start Date End Date Taking? Authorizing Provider  amLODipine (NORVASC) 5 MG tablet Take 1 tablet (5 mg total) by mouth daily. 04/02/18   Ria Bush, MD  HYDROcodone-acetaminophen [facility]) 5-325 MG tablet Take 1 tablet by mouth every 4 (four) hours as needed for severe pain. 08/28/18   Sherwood Gambler, MD  Multiple Vitamins-Minerals (MULTIVITAMIN & MINERAL PO) Take 1 tablet by  mouth daily.    [provider]    Family History Family History  Problem Relation Age of Onset   Cancer Sister 70       colon   Colon cancer Sister 58   Cancer Sister 75       breast   Hypertension Mother    Stomach cancer Neg Hx    CAD Neg Hx    Stroke Neg Hx    Diabetes Neg Hx     Social History Social History   Tobacco Use   Smoking status: Never Smoker   Smokeless tobacco: Never Used  Substance Use Topics   Alcohol use: Yes    Alcohol/week: 3.0 standard drinks    Types: 3 Glasses of wine per week   Drug use: No     Allergies   Patient has no known allergies.   Review of Systems Review of Systems  Constitutional: Negative for fever.  Gastrointestinal: Positive for abdominal pain and nausea. Negative for vomiting.  Genitourinary: Positive for flank pain. Negative for dysuria.  All other systems reviewed and are negative.    Physical Exam Updated Vital Signs BP 122/70    Pulse 78    Temp 98.1 F (36.7 C) (Oral)    Resp 16    Ht 6' (1.829 m)    Wt 99.8 kg    SpO2 100%    BMI 29.84  kg/m   Physical Exam Vitals signs and nursing note reviewed.  Constitutional:      Appearance: He is well-developed.  HENT:     Head: Normocephalic and atraumatic.     Right Ear: External ear normal.     Left Ear: External ear normal.     Nose: Nose normal.  Eyes:     General:        Right eye: No discharge.        Left eye: No discharge.  Neck:     Musculoskeletal: Neck supple.  Cardiovascular:     Rate and Rhythm: Normal rate and regular rhythm.     Heart sounds: Normal heart sounds.  Pulmonary:     Effort: Pulmonary effort is normal.     Breath sounds: Normal breath sounds.  Abdominal:     Palpations: Abdomen is soft.     Tenderness: There is no abdominal tenderness. There is no right CVA tenderness or left CVA tenderness.  Skin:    General: Skin is warm and dry.  Neurological:     Mental Status: He is alert.  Psychiatric:        Mood  and Affect: Mood is not anxious.      ED Treatments / Results  Labs (all labs ordered are listed, but only abnormal results are displayed) Labs Reviewed  BASIC METABOLIC PANEL - Abnormal; Notable for the following components:      Result Value   Glucose, Bld 127 (*)    All other components within normal limits  CBC WITH DIFFERENTIAL/PLATELET - Abnormal; Notable for the following components:   WBC 12.1 (*)    Neutro Abs 8.7 (*)    Monocytes Absolute 1.1 (*)    All other components within normal limits  URINALYSIS, ROUTINE W REFLEX MICROSCOPIC - Abnormal; Notable for the following components:   Hgb urine dipstick TRACE (*)    All other components within normal limits  URINALYSIS, MICROSCOPIC (REFLEX) - Abnormal; Notable for the following components:   Bacteria, UA RARE (*)    All other components within normal limits    EKG None  Radiology Ct Renal Stone Study  Result Date: 08/28/2018 CLINICAL DATA:  Severe left flank pain starting this morning EXAM: CT ABDOMEN AND PELVIS WITHOUT CONTRAST TECHNIQUE: Multidetector CT imaging of the abdomen and pelvis was performed following the standard protocol without IV contrast. COMPARISON:  July 20, 2014 FINDINGS: Lower chest: No acute abnormality. Hepatobiliary: No focal liver abnormality is seen. No gallstones, gallbladder wall thickening, or biliary dilatation. Pancreas: Unremarkable. No pancreatic ductal dilatation or surrounding inflammatory changes. Spleen: Normal in size without focal abnormality. Adrenals/Urinary Tract: There is left hydroureteronephrosis with left perinephric stranding due to obstruction by 3 mm stone in the left ureteral vesicular junction. 11 mm nonobstructing stone is identified in the midpole left kidney. There are bilateral kidney cysts. There is no right hydronephrosis. Stomach/Bowel: Stomach is within normal limits. Appendix appears normal. No evidence of bowel wall thickening, distention, or inflammatory changes.  Vascular/Lymphatic: Aortic atherosclerosis. No enlarged abdominal or pelvic lymph nodes. Reproductive: Prostate is unremarkable. Other: None. Musculoskeletal: Mild degenerative joint changes of the spine are noted. IMPRESSION: Left hydroureteronephrosis and left perinephric stranding due to obstruction by 3 mm stone in the left ureteral vesicular junction. Electronically Signed   By: Abelardo Diesel M.D.   On: 08/28/2018 10:08    Procedures Procedures (including critical care time)  Medications Ordered in ED Medications  morphine 4 MG/ML injection 4 mg (4 mg Intravenous  Given 08/28/18 0925)  ondansetron (ZOFRAN) injection 4 mg (4 mg Intravenous Given 08/28/18 0923)  sodium chloride 0.9 % bolus 1,000 mL (0 mLs Intravenous Stopped 08/28/18 1104)  HYDROmorphone (DILAUDID) injection 1 mg (1 mg Intravenous Given 08/28/18 1037)  ketorolac (TORADOL) 15 MG/ML injection 15 mg (15 mg Intravenous Given 08/28/18 1035)  ondansetron (ZOFRAN) injection 4 mg (4 mg Intravenous Given 08/28/18 1034)     Initial Impression / Assessment and Plan / ED Course  I have reviewed the triage vital signs and the nursing notes.  Pertinent labs & imaging results that were available during my care of the patient were reviewed by me and considered in my medical decision making (see chart for details).        After second round of medicine, the patient's pain is now significantly better.  He appears to have a UVJ stone.  At this point, given good pain control and no signs or symptoms of infection or more severe disease, think he is stable for outpatient expectant management.  Follow-up with urology.  Final Clinical Impressions(s) / ED Diagnoses   Final diagnoses:  Ureteral stone    ED Discharge Orders         Ordered    HYDROcodone-acetaminophen (NORCO) 5-325 MG tablet  Every 4 hours PRN     08/28/18 1307           Sherwood Gambler, MD 08/28/18 1546

## 2018-08-28 NOTE — ED Triage Notes (Signed)
Pt states had kidney stone 2 years ago, was able to pass, at that time was told he had another visible but not causing pain.  Pt reports left abdomen/flank pain beginning 3am, able to pass urine, but with poor flow.  Denies fever.

## 2018-08-28 NOTE — ED Notes (Signed)
Family at bedside. 

## 2018-08-28 NOTE — Telephone Encounter (Signed)
[facility] - Client TELEPHONE ADVICE RECORD [facility] Patient Name: Andre [name] Gender: Male DOB: February 08, 1955 Age: 63 Y 10 M 16 D Return Phone Number: [phone] (Primary), [phone] (Secondary) Address[place] [address] Client [facility] - Client Client Site [place] Physician Ria Bush - MD Contact Type Call Who Is Calling Patient / Member / Family / Caregiver Call Type Triage / Clinical Relationship To Patient Self Return Phone Number [phone] (Primary) Chief Complaint Flank Pain Reason for Call Request to Schedule Office Appointment Initial Comment Caller states he is having trouble urinated with pain on his left side and back. Translation No Nurse Assessment Nurse: Alveta Heimlich, RN, Santiago Glad Date/Time Eilene Ghazi Time): 08/28/2018 7:41:10 AM Confirm and document reason for call. If symptomatic, describe symptoms. ---Caller states he is having trouble urinated with pain on his left side and back. Caller states started at 4am no fever has urgency. Has the patient had close contact with a person known or suspected to have the novel coronavirus illness OR traveled / lives in area with major community spread (including international travel) in the last 14 days from the onset of symptoms? * If Asymptomatic, screen for exposure and travel within the last 14 days. ---No Does the patient have any new or worsening symptoms? ---Yes Will a triage be completed? ---Yes Related visit to physician within the last 2 weeks? ---No Does the PT have any chronic conditions? (i.e. diabetes, asthma, this includes High risk factors for pregnancy, etc.) ---Yes List chronic conditions. ---hypertension Is this a behavioral health or substance abuse call? ---No Guidelines Guideline Title Affirmed Question Affirmed Notes Nurse Date/Time (Eastern Time) Flank Pain [1] SEVERE pain  (e.g., excruciating, scale 8-10) AND [2] present > 1 hour Alveta Heimlich, RN, Santiago Glad 08/28/2018 7:43:07 AM Disp. Time Eilene Ghazi Time) Disposition Final User 08/28/2018 7:44:45 AM Go to ED Now Yes Alveta Heimlich, RN, Ralene Bathe NOTE: All timestamps contained within this report are represented as [place] Standard Time. CONFIDENTIALTY NOTICE: This fax transmission is intended only for the addressee. It contains information that is legally privileged, confidential or otherwise protected from use or disclosure. If you are not the intended recipient, you are strictly prohibited from reviewing, disclosing, copying using or disseminating any of this information or taking any action in reliance on or regarding this information. If you have received this fax in error, please notify us immediately by telephone so that we can arrange for its return to [place]. Phone: [phone], Toll-Free: [phone], Fax: [phone] Page: 2 of 2 Call Id: [id number] [place] Disagree/Comply Comply Caller Understands Yes PreDisposition Go to ED Care Advice Given Per Guideline GO TO ED NOW: * You need to be seen in the Emergency Department. * Go to the ED at ___________ [place] now. Drive carefully. DRIVING: Another adult should drive. BRING MEDICINES: * Please bring a list of your current medicines when you go to the Emergency Department (ER). * It is also a good idea to bring the pill bottles too. This will help the doctor to make certain you are taking the right medicines and the right dose. CARE ADVICE given per Flank Pain (Adult) guideline. Referrals [facility] - ED

## 2018-08-28 NOTE — Telephone Encounter (Signed)
Per chart review tab  Pt is at [facility] ED.

## 2018-08-29 NOTE — Telephone Encounter (Signed)
Left message for pt to call back.  Need to get update on pt.

## 2018-09-01 NOTE — Telephone Encounter (Signed)
Spoke with patient. He did go to ER. He was told that he has kidney stone at 3 cm oe less and he should be able to pass it. The stone that was seen 2 years ago-larger stone sitting in the left kidney still there but not the one causing the problem this time.  Patient will call today to be scheduled with [facility]-has been seen there before, 2 years ago. He is not hurting right now and no other symptoms presently. He is not having to take pain medications at this time. When he did take it it made him constipated. Patient said thank you to Dr. Darnell Level for checking on him.

## 2018-09-24 ENCOUNTER — Telehealth: Payer: Self-pay | Admitting: Family Medicine

## 2018-09-24 NOTE — Telephone Encounter (Signed)
Urology Referral Denied/Closed. "Rejection Reason - Other - patient cancel" Appt  April 3  @ [facility]

## 2019-01-30 DIAGNOSIS — U071 COVID-19: Secondary | ICD-10-CM

## 2019-01-30 HISTORY — DX: COVID-19: U07.1

## 2019-03-02 ENCOUNTER — Ambulatory Visit: Payer: No Typology Code available for payment source | Attending: Internal Medicine

## 2019-03-02 ENCOUNTER — Encounter: Payer: Self-pay | Admitting: Family Medicine

## 2019-03-02 ENCOUNTER — Telehealth: Payer: Self-pay

## 2019-03-02 ENCOUNTER — Ambulatory Visit (INDEPENDENT_AMBULATORY_CARE_PROVIDER_SITE_OTHER): Payer: Self-pay | Admitting: Family Medicine

## 2019-03-02 ENCOUNTER — Other Ambulatory Visit: Payer: Self-pay

## 2019-03-02 VITALS — Temp 97.1°F | Wt 230.0 lb

## 2019-03-02 DIAGNOSIS — U071 COVID-19: Secondary | ICD-10-CM | POA: Insufficient documentation

## 2019-03-02 DIAGNOSIS — R05 Cough: Secondary | ICD-10-CM

## 2019-03-02 DIAGNOSIS — R059 Cough, unspecified: Secondary | ICD-10-CM

## 2019-03-02 DIAGNOSIS — Z20822 Contact with and (suspected) exposure to covid-19: Secondary | ICD-10-CM

## 2019-03-02 NOTE — Telephone Encounter (Signed)
Seen today. 

## 2019-03-02 NOTE — Telephone Encounter (Signed)
Pt already has a virtual appt on 03/02/19 at 12 noon with Dr Darnell Level.

## 2019-03-02 NOTE — Progress Notes (Signed)
Virtual visit completed through [employer]. Due to national recommendations of social distancing due to COVID-19, a virtual visit is felt to be most appropriate for this patient at this time. Reviewed limitations of a virtual visit.   Patient location: home Provider location: [place] at [facility], office If any vitals were documented, they were collected by patient at home unless specified below.    Temp (!) 97.1 F (36.2 C) (Temporal)   Wt 230 lb (104.3 kg)   BMI 31.19 kg/m    CC: cough Subjective:    [name], male    DOB: 1955/03/03, 64 y.o.   MRN: [id number]  HPI: Andre [name] is a 64 y.o. male presenting on 03/02/2019 for Other (Cough, chills, sore throat )   Lost job for 8 months - just got new job/health insurance - working for [employer] in [place] (end of 11/2018).   5d h/o ST, chills, body aches, fatigue, decreased appetite. Minimal cough.  No abd pain, nausea, diarrhea. No dyspnea. No loss of taste/smell.  Non smoker.  No h/o asthma.  He is taking zyrtec, robitussin with honey, honey with tea, soup, water.   Wife has been sick as well - has been feeling ill for the past month. She is staying dyspneic and fatigued. She will go to [employer] [facility] for further evaluation.   No known covid exposures or sick contacts.       Relevant past medical, surgical, family and social history reviewed and updated as indicated. Interim medical history since our last visit reviewed. Allergies and medications reviewed and updated. Outpatient Medications Prior to Visit  Medication Sig Dispense Refill  . amLODipine (NORVASC) 5 MG tablet Take 1 tablet (5 mg total) by mouth daily. 90 tablet 3  . Multiple Vitamins-Minerals (MULTIVITAMIN & MINERAL PO) Take 1 tablet by mouth daily.    Marland Kitchen HYDROcodone-acetaminophen (NORCO) 5-325 MG tablet Take 1 tablet by mouth every 4 (four) hours as needed for severe pain. 15 tablet 0   No facility-administered medications  prior to visit.     Per HPI unless specifically indicated in ROS section below Review of Systems Objective:    Temp (!) 97.1 F (36.2 C) (Temporal)   Wt 230 lb (104.3 kg)   BMI 31.19 kg/m   Wt Readings from Last 3 Encounters:  03/02/19 230 lb (104.3 kg)  08/28/18 220 lb (99.8 kg)  04/02/18 223 lb 9 oz (101.4 kg)     Physical exam: Gen: alert, NAD, not ill appearing Pulm: speaks in complete sentences without increased work of breathing Psych: normal mood, normal thought content      Assessment & Plan:   Problem List Items Addressed This Visit    Cough - Primary    5d cough, chills, ST, body aches, and fatigue. Anticipate viral infection. In setting of covid pandemic, will send for testing. Will recommend out of work until test result returns. Pt agrees with plan. Wife will also go to [facility] for evaluation given prolonged symptoms.       Relevant Orders   Novel Coronavirus, NAA ([facility])       No orders of the defined types were placed in this encounter.  Orders Placed This Encounter  Procedures  . Novel Coronavirus, NAA ([facility])    Order Specific Question:   Is this test for diagnosis or screening    Answer:   Diagnosis of ill patient    Order Specific Question:   Symptomatic for COVID-19 as defined by  CDC    Answer:   Yes    Order Specific Question:   Date of Symptom Onset    Answer:   02/26/2019    Order Specific Question:   Hospitalized for COVID-19    Answer:   No    Order Specific Question:   Admitted to ICU for COVID-19    Answer:   No    Order Specific Question:   Previously tested for COVID-19    Answer:   No    Order Specific Question:   Resident in a congregate (group) care setting    Answer:   No    Order Specific Question:   Is the patient student?    Answer:   No    Order Specific Question:   Employed in healthcare setting    Answer:   No    Order Specific Question:   Release to patient    Answer:   Immediate    I discussed the  assessment and treatment plan with the patient. The patient was provided an opportunity to ask questions and all were answered. The patient agreed with the plan and demonstrated an understanding of the instructions. The patient was advised to call back or seek an in-person evaluation if the symptoms worsen or if the condition fails to improve as anticipated.  Follow up plan: Return if symptoms worsen or fail to improve.  Ria Bush, MD

## 2019-03-02 NOTE — Assessment & Plan Note (Signed)
5d cough, chills, ST, body aches, and fatigue. Anticipate viral infection. In setting of covid pandemic, will send for testing. Will recommend out of work until test result returns. Pt agrees with plan. Wife will also go to [facility] for evaluation given prolonged symptoms.

## 2019-03-02 NOTE — Telephone Encounter (Signed)
[name] - Client Nonclinical Telephone Record AccessNurse Client [name] - Client Client Site [place] Physician Ria Bush - MD Contact Type Call Who Is Calling Patient / Member / Family / Caregiver Caller Name [name] Phone Number [phone] Patient Name Andre [name] Patient DOB [id number] Call Type Message Only Information Provided Reason for Call Request to Schedule Office Appointment Initial Comment Caller states she needs to make an appt. Disp. Time Disposition Final User 03/02/2019 8:06:45 AM General Information Provided Yes Nyoka Cowden, Amy

## 2019-03-03 LAB — NOVEL CORONAVIRUS, NAA: SARS-CoV-2, NAA: DETECTED — AB

## 2019-03-04 ENCOUNTER — Other Ambulatory Visit: Payer: Self-pay | Admitting: Internal Medicine

## 2019-03-04 ENCOUNTER — Encounter: Payer: Self-pay | Admitting: Family Medicine

## 2019-03-04 ENCOUNTER — Ambulatory Visit: Payer: Self-pay | Admitting: Family Medicine

## 2019-03-04 ENCOUNTER — Telehealth: Payer: Self-pay | Admitting: Family Medicine

## 2019-03-04 DIAGNOSIS — I10 Essential (primary) hypertension: Secondary | ICD-10-CM

## 2019-03-04 DIAGNOSIS — U071 COVID-19: Secondary | ICD-10-CM

## 2019-03-04 NOTE — Telephone Encounter (Signed)
Noted positive covid test result. Spoke with patient . Ongoing chills and body aches, no dyspnea.  He has been contacted to set up monoclonal ab infusion - did recommend this - he will call to schedule.   plz place on covid + list and call Friday for update on symptoms and after infusion.

## 2019-03-04 NOTE — Progress Notes (Signed)
  I connected by phone with Andre [name] on 03/04/2019 at 1:49 PM to discuss the potential use of an new treatment for mild to moderate COVID-19 viral infection in non-hospitalized patients.  This patient is a 64 y.o. male that meets the FDA criteria for Emergency Use Authorization of bamlanivimab or casirivimab\imdevimab.  Has a (+) direct SARS-CoV-2 viral test result  Has mild or moderate COVID-19   Is ? 63 years of age and weighs ? 40 kg  Is NOT hospitalized due to COVID-19  Is NOT requiring oxygen therapy or requiring an increase in baseline oxygen flow rate due to COVID-19  Is within 10 days of symptom onset  Has at least one of the high risk factor(s) for progression to severe COVID-19 and/or hospitalization as defined in EUA.  Specific high risk criteria : >55yo c  Hypertension   I have spoken and communicated the following to the patient or parent/caregiver:  1. FDA has authorized the emergency use of bamlanivimab and casirivimab\imdevimab for the treatment of mild to moderate COVID-19 in adults and pediatric patients with positive results of direct SARS-CoV-2 viral testing who are 52 years of age and older weighing at least 40 kg, and who are at high risk for progressing to severe COVID-19 and/or hospitalization.  2. The significant known and potential risks and benefits of bamlanivimab and casirivimab\imdevimab, and the extent to which such potential risks and benefits are unknown.  3. Information on available alternative treatments and the risks and benefits of those alternatives, including clinical trials.  4. Patients treated with bamlanivimab and casirivimab\imdevimab should continue to self-isolate and use infection control measures (e.g., wear mask, isolate, social distance, avoid sharing personal items, clean and disinfect "high touch" surfaces, and frequent handwashing) according to CDC guidelines.   5. The patient or parent/caregiver has the option to accept or  refuse bamlanivimab or casirivimab\imdevimab .  After reviewing this information with the patient, The patient agreed to proceed with receiving the bamlanimivab infusion and will be provided a copy of the Fact sheet prior to receiving the infusion.   Appointment scheduled for 2/4 at 10:30am   Alan Ripper, NP-C [place]  pgr [phone]

## 2019-03-04 NOTE — Telephone Encounter (Signed)
Noted.  Pt added to Call Log.  

## 2019-03-05 ENCOUNTER — Encounter (HOSPITAL_COMMUNITY): Payer: Self-pay

## 2019-03-05 ENCOUNTER — Ambulatory Visit (HOSPITAL_COMMUNITY)
Admission: RE | Admit: 2019-03-05 | Discharge: 2019-03-05 | Disposition: A | Discharge status: Home / Self Care | Payer: No Typology Code available for payment source | Source: Ambulatory Visit | Attending: Pulmonary Disease | Admitting: Pulmonary Disease

## 2019-03-05 DIAGNOSIS — I10 Essential (primary) hypertension: Secondary | ICD-10-CM | POA: Insufficient documentation

## 2019-03-05 DIAGNOSIS — Z23 Encounter for immunization: Secondary | ICD-10-CM | POA: Insufficient documentation

## 2019-03-05 DIAGNOSIS — U071 COVID-19: Secondary | ICD-10-CM | POA: Diagnosis not present

## 2019-03-05 MED ORDER — DIPHENHYDRAMINE HCL 50 MG/ML IJ SOLN: 50.0000 mg | Freq: Once | INTRAMUSCULAR | Status: DC | PRN

## 2019-03-05 MED ORDER — EPINEPHRINE 0.3 MG/0.3ML IJ SOAJ: 0.3000 mg | Freq: Once | INTRAMUSCULAR | Status: DC | PRN

## 2019-03-05 MED ORDER — SODIUM CHLORIDE 0.9 % IV SOLN
INTRAVENOUS | Status: DC | PRN
  Administered 2019-03-05: 250 mL via INTRAVENOUS

## 2019-03-05 MED ORDER — SODIUM CHLORIDE 0.9 % IV SOLN
700.0000 mg | Freq: Once | INTRAVENOUS | Status: AC
  Administered 2019-03-05: 700 mg via INTRAVENOUS
  Filled 2019-03-05: qty 20

## 2019-03-05 MED ORDER — ALBUTEROL SULFATE HFA 108 (90 BASE) MCG/ACT IN AERS: 2.0000 | INHALATION_SPRAY | Freq: Once | RESPIRATORY_TRACT | Status: DC | PRN

## 2019-03-05 MED ORDER — METHYLPREDNISOLONE SODIUM SUCC 125 MG IJ SOLR: 125.0000 mg | Freq: Once | INTRAMUSCULAR | Status: DC | PRN

## 2019-03-05 MED ORDER — FAMOTIDINE IN NACL 20-0.9 MG/50ML-% IV SOLN: 20.0000 mg | Freq: Once | INTRAVENOUS | Status: DC | PRN

## 2019-03-05 NOTE — Progress Notes (Signed)
  Diagnosis: COVID-19  Physician: Dr. Patrick Wright  Procedure: Covid Infusion Clinic Med: bamlanivimab infusion - Provided patient with bamlanimivab fact sheet for patients, parents and caregivers prior to infusion.  Complications: No immediate complications noted.  Discharge: Discharged home   Ally Yow 03/05/2019   

## 2019-03-05 NOTE — Discharge Instructions (Signed)

## 2019-03-06 NOTE — Addendum Note (Signed)
Addended by: Ria Bush on: 03/06/2019 12:52 PM   Modules accepted: Orders

## 2019-03-06 NOTE — Telephone Encounter (Signed)
Spoke with pt asking how is feeling.  Says he's feeling a little bit better.  The IV infusion seemed to help.  States the chills and aches/pains are less severe.  Last fever was last night.  Still has some nasal congestion.  FYI to Dr. Darnell Level.

## 2019-03-06 NOTE — Telephone Encounter (Signed)
Spoke with pt relaying Dr. Synthia Innocent message.  Pt agrees to [facility] home monitoring via [facility].  Says he will let us know if he needs anything for work.   Pt placed on Call Log.

## 2019-03-06 NOTE — Telephone Encounter (Signed)
Can we offer to sign him up for covid home monitoring through [place] for weekend.  plz call Monday for update and if doing better may stop following.  Let me know if he needs anything for work.  First day of symptoms was ~02/26/2019.

## 2019-03-09 ENCOUNTER — Encounter: Payer: Self-pay | Admitting: Family Medicine

## 2019-03-09 ENCOUNTER — Ambulatory Visit (INDEPENDENT_AMBULATORY_CARE_PROVIDER_SITE_OTHER): Payer: No Typology Code available for payment source | Admitting: Family Medicine

## 2019-03-09 DIAGNOSIS — U071 COVID-19: Secondary | ICD-10-CM

## 2019-03-09 NOTE — Assessment & Plan Note (Addendum)
Did note improvement after Bamlanivimab infusion however persistent lingering symptoms of chills, ST, weakness, fatigue and loss of appetite. Anticipate persistent Covid related symptoms. rec continue supportive care for next 24-48 hours, reviewed vit D, vit C, zinc intake, and update [place] with symptoms in another 2-3 days. Red flags for further evaluation reviewed.  Reviewed option of in-person respiratory clinic evaluation.  Update [place] if fever >101 - he has thermometer at home.

## 2019-03-09 NOTE — Telephone Encounter (Signed)
Pt had his infusion 03-11-19. He is not feeling much better. Says he is weak. Wife thinks he may need a virtual visit.

## 2019-03-09 NOTE — Progress Notes (Signed)
Virtual visit completed through [facility]. Due to national recommendations of social distancing due to COVID-19, a virtual visit is felt to be most appropriate for this patient at this time. Reviewed limitations of a virtual visit.   Patient location: home Provider location: [facility], office If any vitals were documented, they were collected by patient at home unless specified below.    Ht 6' (1.829 m)   Wt 235 lb (106.6 kg)   BMI 31.87 kg/m    CC: f/u Covid19 illness Subjective:    [name], male    DOB: August 09, 1955, 64 y.o.   MRN: [id number]  HPI: Andre [name] is a 64 y.o. male presenting on 03/09/2019 for Chills (C/o still having chills, fatigue, nasal congestion and sore throat.  Says he does not seem to be feeling much better. )   Positive covid test 03/02/2019.  Received bamlanivimab infusion 03/05/2019.  Initial day of symptoms 02/26/2019.   Ongoing chills, ST, weakness, fatigue. Persistent dry cough and chest congestion. Loss of appetite. Feverish overnight.  No dyspnea, HA, ear or tooth pain, facial pain, nausea, diarrhea or loss of taste or smell.  He is taking zyrtec and robitussin with honey.  He is also drinking ensure, gatorade and plenty of water. He is taking multivitamin as well as emergen-C daily.   Andre [name] also with covid illness with prolonged symptoms - seen at ER and treated with azithromycin and prednisone with benefit.     Relevant past medical, surgical, family and social history reviewed and updated as indicated. Interim medical history since our last visit reviewed. Allergies and medications reviewed and updated. Outpatient Medications Prior to Visit  Medication Sig Dispense Refill  . amLODipine (NORVASC) 5 MG tablet Take 1 tablet (5 mg total) by mouth daily. 90 tablet 3  . Multiple Vitamins-Minerals (MULTIVITAMIN & MINERAL PO) Take 1 tablet by mouth daily.     No facility-administered medications prior to visit.     Per  HPI unless specifically indicated in ROS section below Review of Systems Objective:    Ht 6' (1.829 m)   Wt 235 lb (106.6 kg)   BMI 31.87 kg/m   Wt Readings from Last 3 Encounters:  03/09/19 235 lb (106.6 kg)  03/02/19 230 lb (104.3 kg)  08/28/18 220 lb (99.8 kg)     Physical exam: Gen: alert, NAD, not ill appearing Pulm: speaks in complete sentences without increased work of breathing Psych: normal mood, normal thought content      Results for orders placed or performed in visit on 03/02/19  Novel Coronavirus, NAA ([facility])   Specimen: Nasopharyngeal(NP) swabs in vial transport medium   NASOPHARYNGE  TESTING  Result Value Ref Range   SARS-CoV-2, NAA Detected (A) Not Detected   Assessment & Plan:   Problem List Items Addressed This Visit    COVID-19 virus infection    Did note improvement after Bamlanivimab infusion however persistent lingering symptoms of chills, ST, weakness, fatigue and loss of appetite. Anticipate persistent Covid related symptoms. rec continue supportive care for next 24-48 hours, reviewed vit D, vit C, zinc intake, and update [place] with symptoms in another 2-3 days. Red flags for further evaluation reviewed.  Reviewed option of in-person respiratory clinic evaluation.  Update [place] if fever >101 - he has thermometer at home.           No orders of the defined types were placed in this encounter.  No orders of the defined types were placed in this  encounter.   I discussed the assessment and treatment plan with the patient. The patient was provided an opportunity to ask questions and all were answered. The patient agreed with the plan and demonstrated an understanding of the instructions. The patient was advised to call back or seek an in-person evaluation if the symptoms worsen or if the condition fails to improve as anticipated.  Follow up plan: No follow-ups on file.  Ria Bush, MD

## 2019-03-11 ENCOUNTER — Encounter: Payer: Self-pay | Admitting: Family Medicine

## 2019-03-12 ENCOUNTER — Telehealth: Payer: Self-pay

## 2019-03-12 NOTE — Telephone Encounter (Signed)
Pt left v/m requesting note for employer to return to work on 03/16/19. Tested + for covid on 03/03/19. No fever, chills,muscle or joint pain,H/A,cough,rash,SOB, abd pain or diarrhea,loss of taste or smell,red eye,runny nose, bruising or bleeding, and no vomiting. Pt does still have fatigue,energy level is not fully back, generalized weakness and this morning pt had S/T. Pt request cb to see if Dr Darnell Level will return pt to work yet.

## 2019-03-13 NOTE — Telephone Encounter (Signed)
I'm glad he's doing better. If continues feeling better I think reasonable to set return to work date as 03/16/2019 plz notify letter written and sent via [employer]

## 2019-03-13 NOTE — Telephone Encounter (Signed)
Lvm asking pt to call back.  Need to relay Dr. G's message.  

## 2019-03-16 NOTE — Telephone Encounter (Signed)
Advised pt of PCP msg. Pt reports he has gotten the letter and is back to work. He wanted to thank Dr. Darnell Level for taking good care of him and being so kind along with the office staff. Advised if anything else is needed to contact the office. Pt verbalized understanding.

## 2019-03-16 NOTE — Telephone Encounter (Signed)
[facility] - Client Nonclinical Telephone Record AccessNurse Client [facility] - Client Client Site [place] Physician Ria Bush - MD Contact Type Call Who Is Calling Patient / Member / Family / Caregiver Caller Name Andre [name] Phone Number [phone] Call Type Message Only Information Provided Reason for Call Returning a Call from the Office Andre [name] states that he is returning a call from [facility]. Additional Comment Provided office hours. Disp. Time Disposition Final User 03/13/2019 6:09:01 PM General Information Provided Yes [name] Call Closed By: [name] Transaction Date/Time: 03/13/2019 6:07:22 PM (ET)

## 2019-06-25 ENCOUNTER — Ambulatory Visit (INDEPENDENT_AMBULATORY_CARE_PROVIDER_SITE_OTHER): Payer: No Typology Code available for payment source | Admitting: Family Medicine

## 2019-06-25 ENCOUNTER — Encounter: Payer: Self-pay | Admitting: Family Medicine

## 2019-06-25 ENCOUNTER — Other Ambulatory Visit: Payer: Self-pay

## 2019-06-25 VITALS — BP 122/80 | HR 82 | Temp 98.0°F | Ht 72.0 in | Wt 228.2 lb

## 2019-06-25 DIAGNOSIS — I10 Essential (primary) hypertension: Secondary | ICD-10-CM | POA: Diagnosis not present

## 2019-06-25 DIAGNOSIS — M5441 Lumbago with sciatica, right side: Secondary | ICD-10-CM | POA: Diagnosis not present

## 2019-06-25 MED ORDER — PREDNISONE 20 MG PO TABS: ORAL_TABLET | ORAL | 0 refills | Status: DC

## 2019-06-25 MED ORDER — AMLODIPINE BESYLATE 5 MG PO TABS: 5.0000 mg | ORAL_TABLET | Freq: Every day | ORAL | 1 refills | Status: DC

## 2019-06-25 NOTE — Assessment & Plan Note (Signed)
Stable period. Requests amlodipine refilled.

## 2019-06-25 NOTE — Patient Instructions (Signed)
I think you have sciatica on the right - pinched sciatic nerve as it comes down the buttock  Treat with prednisone taper sent to pharmacy After prednisone, may use ibuprofen 400mg  2-3 times daily as needed, take with meals.  Do exercises provided today.  If not improving, let me know to consider PT course.  If worsening, let us know sooner.   Sciatica  Sciatica is pain, numbness, weakness, or tingling along the path of the sciatic nerve. The sciatic nerve starts in the lower back and runs down the back of each leg. The nerve controls the muscles in the lower leg and in the back of the knee. It also provides feeling (sensation) to the back of the thigh, the lower leg, and the sole of the foot. Sciatica is a symptom of another medical condition that pinches or puts pressure on the sciatic nerve. Sciatica most often only affects one side of the body. Sciatica usually goes away on its own or with treatment. In some cases, sciatica may come back (recur). What are the causes? This condition is caused by pressure on the sciatic nerve or pinching of the nerve. This may be the result of:  A disk in between the bones of the spine bulging out too far (herniated disk).  Age-related changes in the spinal disks.  A pain disorder that affects a muscle in the buttock.  Extra bone growth near the sciatic nerve.  A break (fracture) of the pelvis.  Pregnancy.  Tumor. This is rare. What increases the risk? The following factors may make you more likely to develop this condition:  Playing sports that place pressure or stress on the spine.  Having poor strength and flexibility.  A history of back injury or surgery.  Sitting for long periods of time.  Doing activities that involve repetitive bending or lifting.  Obesity. What are the signs or symptoms? Symptoms can vary from mild to very severe, and they may include:  Any of these problems in the lower back, leg, hip, or buttock: ? Mild  tingling, numbness, or dull aches. ? Burning sensations. ? Sharp pains.  Numbness in the back of the calf or the sole of the foot.  Leg weakness.  Severe back pain that makes movement difficult. Symptoms may get worse when you cough, sneeze, or laugh, or when you sit or stand for long periods of time. How is this diagnosed? This condition may be diagnosed based on:  Your symptoms and medical history.  A physical exam.  Blood tests.  Imaging tests, such as: ? X-rays. ? MRI. ? CT scan. How is this treated? In many cases, this condition improves on its own without treatment. However, treatment may include:  Reducing or modifying physical activity.  Exercising and stretching.  Icing and applying heat to the affected area.  Medicines that help to: ? Relieve pain and swelling. ? Relax your muscles.  Injections of medicines that help to relieve pain, irritation, and inflammation around the sciatic nerve (steroids).  Surgery. Follow these instructions at home: Medicines  Take over-the-counter and prescription medicines only as told by your health care provider.  Ask your health care provider if the medicine prescribed to you: ? Requires you to avoid driving or using heavy machinery. ? Can cause constipation. You may need to take these actions to prevent or treat constipation:  Drink enough fluid to keep your urine pale yellow.  Take over-the-counter or prescription medicines.  Eat foods that are high in fiber, such as  beans, whole grains, and fresh fruits and vegetables.  Limit foods that are high in fat and processed sugars, such as fried or sweet foods. Managing pain      If directed, put ice on the affected area. ? Put ice in a plastic bag. ? Place a towel between your skin and the bag. ? Leave the ice on for 20 minutes, 2-3 times a day.  If directed, apply heat to the affected area. Use the heat source that your health care provider recommends, such as a  moist heat pack or a heating pad. ? Place a towel between your skin and the heat source. ? Leave the heat on for 20-30 minutes. ? Remove the heat if your skin turns bright red. This is especially important if you are unable to feel pain, heat, or cold. You may have a greater risk of getting burned. Activity   Return to your normal activities as told by your health care provider. Ask your health care provider what activities are safe for you.  Avoid activities that make your symptoms worse.  Take brief periods of rest throughout the day. ? When you rest for longer periods, mix in some mild activity or stretching between periods of rest. This will help to prevent stiffness and pain. ? Avoid sitting for long periods of time without moving. Get up and move around at least one time each hour.  Exercise and stretch regularly, as told by your health care provider.  Do not lift anything that is heavier than 10 lb (4.5 kg) while you have symptoms of sciatica. When you do not have symptoms, you should still avoid heavy lifting, especially repetitive heavy lifting.  When you lift objects, always use proper lifting technique, which includes: ? Bending your knees. ? Keeping the load close to your body. ? Avoiding twisting. General instructions  Maintain a healthy weight. Excess weight puts extra stress on your back.  Wear supportive, comfortable shoes. Avoid wearing high heels.  Avoid sleeping on a mattress that is too soft or too hard. A mattress that is firm enough to support your back when you sleep may help to reduce your pain.  Keep all follow-up visits as told by your health care provider. This is important. Contact a health care provider if:  You have pain that: ? Wakes you up when you are sleeping. ? Gets worse when you lie down. ? Is worse than you have experienced in the past. ? Lasts longer than 4 weeks.  You have an unexplained weight loss. Get help right away if:  You are  not able to control when you urinate or have bowel movements (incontinence).  You have: ? Weakness in your lower back, pelvis, buttocks, or legs that gets worse. ? Redness or swelling of your back. ? A burning sensation when you urinate. Summary  Sciatica is pain, numbness, weakness, or tingling along the path of the sciatic nerve.  This condition is caused by pressure on the sciatic nerve or pinching of the nerve.  Sciatica can cause pain, numbness, or tingling in the lower back, legs, hips, and buttocks.  Treatment often includes rest, exercise, medicines, and applying ice or heat. This information is not intended to replace advice given to you by your health care provider. Make sure you discuss any questions you have with your health care provider. Document Revised: 02/03/2018 Document Reviewed: 02/03/2018 Elsevier Patient Education  [place].

## 2019-06-25 NOTE — Assessment & Plan Note (Signed)
Anticipate sciatica from piriformis syndrome. He does have radicular pain down R leg but no radiculopathy symptoms. Benign exam. Supportive care reviewed - rest, stretching (exercises provided today), prednisone taper then use NSAID PRN. Update if not improving to consider PT and/or imaging. Pt agrees with plan.

## 2019-06-25 NOTE — Progress Notes (Signed)
This visit was conducted in person.  BP 122/80 (BP Location: Left Arm, Patient Position: Sitting, Cuff Size: Large)   Pulse 82   Temp 98 F (36.7 C) (Temporal)   Ht 6' (1.829 m)   Wt 228 lb 3 oz (103.5 kg)   SpO2 97%   BMI 30.95 kg/m    CC: R lower back pain Subjective:    [name], male    DOB: [birth year], 64 y.o.   MRN: [id number]  HPI: Andre [name] is a 64 y.o. male presenting on 06/25/2019 for Back Pain (C/o right side low back pain radiating down anterior right leg.  Pain disturbs sleep.  Started about 2 mos ago. )   ~1 mo h/o R lower back pain with radiation of pain down anterior R thigh to knee, affecting sleep at night. Dull sore ache. No numbness, weakness or paresthesias. No bladder/bowel incontinence, saddle anesthesia, fevers/chills. No inciting trauma/injury or falls.   Wife concerned because he has been limping.  Managing with stretching.      Relevant past medical, surgical, family and social history reviewed and updated as indicated. Interim medical history since our last visit reviewed. Allergies and medications reviewed and updated. Outpatient Medications Prior to Visit  Medication Sig Dispense Refill  . Multiple Vitamins-Minerals (MULTIVITAMIN & MINERAL PO) Take 1 tablet by mouth daily.    [place] Kitchen amLODipine (NORVASC) 5 MG tablet Take 1 tablet (5 mg total) by mouth daily. 90 tablet 3   No facility-administered medications prior to visit.     Per HPI unless specifically indicated in ROS section below Review of Systems Objective:  BP 122/80 (BP Location: Left Arm, Patient Position: Sitting, Cuff Size: Large)   Pulse 82   Temp 98 F (36.7 C) (Temporal)   Ht 6' (1.829 m)   Wt 228 lb 3 oz (103.5 kg)   SpO2 97%   BMI 30.95 kg/m   Wt Readings from Last 3 Encounters:  06/25/19 228 lb 3 oz (103.5 kg)  03/09/19 235 lb (106.6 kg)  03/02/19 230 lb (104.3 kg)      Physical Exam Vitals and nursing note reviewed.  Constitutional:    Appearance: Normal appearance. He is not ill-appearing.  Musculoskeletal:        General: Normal range of motion.     Right lower leg: No edema.     Left lower leg: No edema.     Comments:  No pain midline spine No paraspinous mm tenderness Neg SLR bilaterally. No pain with int/ext rotation at hip - but very stiff movements. Neg FABER. No reproducible pain at SIJ, GTB or sciatic notch bilaterally.   Skin:    General: Skin is warm and dry.     Findings: No rash.  Neurological:     General: No focal deficit present.     Mental Status: He is alert.     Comments: 5/5 strength BLE       Results for orders placed or performed in visit on 03/02/19  Novel Coronavirus, NAA ([facility])   Specimen: Nasopharyngeal(NP) swabs in vial transport medium   NASOPHARYNGE  TESTING  Result Value Ref Range   SARS-CoV-2, NAA Detected (A) Not Detected   Assessment & Plan:  This visit occurred during the SARS-CoV-2 public health emergency.  Safety protocols were in place, including screening questions prior to the visit, additional usage of staff PPE, and extensive cleaning of exam room while observing appropriate contact time as indicated for disinfecting solutions.   Problem List  Items Addressed This Visit    Right-sided low back pain with right-sided sciatica - Primary    Anticipate sciatica from piriformis syndrome. He does have radicular pain down R leg but no radiculopathy symptoms. Benign exam. Supportive care reviewed - rest, stretching (exercises provided today), prednisone taper then use NSAID PRN. Update if not improving to consider PT and/or imaging. Pt agrees with plan.       Relevant Medications   predniSONE (DELTASONE) 20 MG tablet   Essential hypertension, benign    Stable period. Requests amlodipine refilled.       Relevant Medications   amLODipine (NORVASC) 5 MG tablet       Meds ordered this encounter  Medications  . predniSONE (DELTASONE) 20 MG tablet    Sig: Take two  tablets daily for 3 days followed by one tablet daily for 3 days    Dispense:  9 tablet    Refill:  0  . amLODipine (NORVASC) 5 MG tablet    Sig: Take 1 tablet (5 mg total) by mouth daily.    Dispense:  90 tablet    Refill:  1   No orders of the defined types were placed in this encounter.   Patient instructions: I think you have sciatica on the right - pinched sciatic nerve as it comes down the buttock  Treat with prednisone taper sent to pharmacy After prednisone, may use ibuprofen 400mg  2-3 times daily as needed, take with meals.  Do exercises provided today.  If not improving, let me know to consider PT course.  If worsening, let us know sooner.   Follow up plan: Return if symptoms worsen or fail to improve, for annual exam, prior fasting for blood work.  Ria Bush, MD

## 2019-09-02 ENCOUNTER — Encounter: Payer: Self-pay | Admitting: Family Medicine

## 2019-09-02 ENCOUNTER — Ambulatory Visit (INDEPENDENT_AMBULATORY_CARE_PROVIDER_SITE_OTHER): Payer: No Typology Code available for payment source | Admitting: Family Medicine

## 2019-09-02 ENCOUNTER — Ambulatory Visit (INDEPENDENT_AMBULATORY_CARE_PROVIDER_SITE_OTHER)
Admission: RE | Admit: 2019-09-02 | Discharge: 2019-09-02 | Disposition: A | Discharge status: Home / Self Care | Payer: No Typology Code available for payment source | Source: Ambulatory Visit | Attending: Family Medicine | Admitting: Family Medicine

## 2019-09-02 VITALS — BP 140/80 | HR 94 | Temp 97.8°F | Ht 72.0 in | Wt 224.4 lb

## 2019-09-02 DIAGNOSIS — I10 Essential (primary) hypertension: Secondary | ICD-10-CM | POA: Diagnosis not present

## 2019-09-02 DIAGNOSIS — M25551 Pain in right hip: Secondary | ICD-10-CM

## 2019-09-02 DIAGNOSIS — R600 Localized edema: Secondary | ICD-10-CM

## 2019-09-02 DIAGNOSIS — M160 Bilateral primary osteoarthritis of hip: Secondary | ICD-10-CM | POA: Insufficient documentation

## 2019-09-02 DIAGNOSIS — M1611 Unilateral primary osteoarthritis, right hip: Secondary | ICD-10-CM | POA: Insufficient documentation

## 2019-09-02 MED ORDER — BENAZEPRIL HCL 20 MG PO TABS: 20.0000 mg | ORAL_TABLET | Freq: Every day | ORAL | 6 refills | Status: DC

## 2019-09-02 NOTE — Assessment & Plan Note (Signed)
Bilaterally, worse with salty meal. rec elevation of legs with prolonged sitting, drink plenty of water, avoid salt/sodium in diet. ?med related - will d/c amlodipine and start benazepril. Reviewed watching for dry cough and angioedema on ACEI. RTC 10 days Cr check. RTC at his convenience for CPE

## 2019-09-02 NOTE — Patient Instructions (Addendum)
Xray of right hip today We will refer you to PT for R hip pain. For ankle swelling, possible side effect of amlodipine - switch from amlodipine onto benazepril 20mg  daily - return in 1.5 weeks for labs only. Schedule physical at your convenience.

## 2019-09-02 NOTE — Assessment & Plan Note (Signed)
This is not consistent with previously R sciatica from piriformis syndrome but rather possible hip arthritis vs extremely tight hip/groin tendons, ligaments. Will refer to PT for eval/management.

## 2019-09-02 NOTE — Assessment & Plan Note (Signed)
Chronic, adequate on amlodipine. Could have better control. See below for med changes discussed today. Change from amlodipine to benazepril with close monitoring of BP at home. Update with effect.

## 2019-09-02 NOTE — Progress Notes (Signed)
This visit was conducted in person.  BP 140/80 (BP Location: Left Arm, Patient Position: Sitting, Cuff Size: Large)   Pulse 94   Temp 97.8 F (36.6 C) (Temporal)   Ht 6' (1.829 m)   Wt 224 lb 6 oz (101.8 kg)   SpO2 97%   BMI 30.43 kg/m    CC: foot swelling Subjective:    [name], male    DOB: 1955/10/25, 64 y.o.   MRN: [id number]  HPI: Andre [name] is a 64 y.o. male presenting on 09/02/2019 for Joint Swelling (C/o bilateral ankle swelling.  Started 2-3 wks ago.  Denies any pain. )   2-3 wk h/o bilateral ankle swelling. Initially noted due to indentation left by sock. May be worsening. Drinking plenty of water. Worse swelling noted after higher salt intake. Denies chest tightness, dyspnea, HA.   HTN - on amlodipine for several years. Has not had trouble with this in the past. HCTZ caused fatigue, weakness.   Wife also worried that he may have missed some doses.   R sciatica presumed from piriforms syndrome - treated 05/2019 with prednisone taper. Some ongoing intermittent pain - has been trying home exercises with some relief. Describes anterior groin pain worse with busy days at work.      Relevant past medical, surgical, family and social history reviewed and updated as indicated. Interim medical history since our last visit reviewed. Allergies and medications reviewed and updated. Outpatient Medications Prior to Visit  Medication Sig Dispense Refill  . Multiple Vitamins-Minerals (MULTIVITAMIN & MINERAL PO) Take 1 tablet by mouth daily.    Marland Kitchen amLODipine (NORVASC) 5 MG tablet Take 1 tablet (5 mg total) by mouth daily. 90 tablet 1  . predniSONE (DELTASONE) 20 MG tablet Take two tablets daily for 3 days followed by one tablet daily for 3 days 9 tablet 0   No facility-administered medications prior to visit.     Per HPI unless specifically indicated in ROS section below Review of Systems Objective:  BP 140/80 (BP Location: Left Arm, Patient Position:  Sitting, Cuff Size: Large)   Pulse 94   Temp 97.8 F (36.6 C) (Temporal)   Ht 6' (1.829 m)   Wt 224 lb 6 oz (101.8 kg)   SpO2 97%   BMI 30.43 kg/m   Wt Readings from Last 3 Encounters:  09/02/19 224 lb 6 oz (101.8 kg)  06/25/19 228 lb 3 oz (103.5 kg)  03/09/19 235 lb (106.6 kg)      Physical Exam Vitals and nursing note reviewed.  Constitutional:      Appearance: Normal appearance. He is not ill-appearing.  Neck:     Thyroid: No thyroid mass, thyromegaly or thyroid tenderness.  Cardiovascular:     Rate and Rhythm: Normal rate and regular rhythm.     Pulses: Normal pulses.     Heart sounds: Normal heart sounds. No murmur heard.   Pulmonary:     Effort: Pulmonary effort is normal. No respiratory distress.     Breath sounds: Normal breath sounds. No wheezing, rhonchi or rales.  Musculoskeletal:     Right lower leg: Edema (tr) present.     Left lower leg: Edema (tr) present.     Comments:  Neg SLR bilaterally. No pain with int/ext rotation at hip but very limited in int rotation bilaterally.  Skin:    General: Skin is warm and dry.     Findings: No rash.  Neurological:     Mental Status: He is alert.  Psychiatric:        Mood and Affect: Mood normal.        Behavior: Behavior normal.       Results for orders placed or performed in visit on 03/02/19  Novel Coronavirus, NAA ([facility])   Specimen: Nasopharyngeal(NP) swabs in vial transport medium   NASOPHARYNGE  TESTING  Result Value Ref Range   SARS-CoV-2, NAA Detected (A) Not Detected   Assessment & Plan:  This visit occurred during the SARS-CoV-2 public health emergency.  Safety protocols were in place, including screening questions prior to the visit, additional usage of staff PPE, and extensive cleaning of exam room while observing appropriate contact time as indicated for disinfecting solutions.   Problem List Items Addressed This Visit    Right hip pain    This is not consistent with previously R sciatica from  piriformis syndrome but rather possible hip arthritis vs extremely tight hip/groin tendons, ligaments. Will refer to PT for eval/management.       Relevant Orders   DG Hip Unilat W OR W/O Pelvis 2-3 Views Right   Ambulatory referral to Physical Therapy   Pedal edema - Primary    Bilaterally, worse with salty meal. rec elevation of legs with prolonged sitting, drink plenty of water, avoid salt/sodium in diet. ?med related - will d/c amlodipine and start benazepril. Reviewed watching for dry cough and angioedema on ACEI. RTC 10 days Cr check. RTC at his convenience for CPE      Essential hypertension, benign    Chronic, adequate on amlodipine. Could have better control. See below for med changes discussed today. Change from amlodipine to benazepril with close monitoring of BP at home. Update with effect.       Relevant Medications   benazepril (LOTENSIN) 20 MG tablet       Meds ordered this encounter  Medications  . benazepril (LOTENSIN) 20 MG tablet    Sig: Take 1 tablet (20 mg total) by mouth daily.    Dispense:  30 tablet    Refill:  6    In place of amlodipine   Orders Placed This Encounter  Procedures  . DG Hip Unilat W OR W/O Pelvis 2-3 Views Right    Standing Status:   Future    Number of Occurrences:   1    Standing Expiration Date:   09/01/2020    Order Specific Question:   Reason for Exam (SYMPTOM  OR DIAGNOSIS REQUIRED)    Answer:   R anterior hip pain eval for arthritis burden    Order Specific Question:   Preferred imaging location?    Answer:   [facility]    Order Specific Question:   Radiology Contrast Protocol - do NOT remove file path    Answer:   [email or web address]  . Ambulatory referral to Physical Therapy    Referral Priority:   Routine    Referral Type:   Physical Medicine    Referral Reason:   Specialty Services Required    Requested Specialty:   Physical Therapy    Number of Visits Requested:   1     Patient Instructions  Xray of right hip today We will refer you to PT for R hip pain. For ankle swelling, possible side effect of amlodipine - switch from amlodipine onto benazepril 20mg  daily - return in 1.5 weeks for labs only. Schedule physical at your convenience.    Follow up plan: No follow-ups on file.  Ria Bush, MD

## 2019-09-14 ENCOUNTER — Other Ambulatory Visit (INDEPENDENT_AMBULATORY_CARE_PROVIDER_SITE_OTHER): Payer: No Typology Code available for payment source

## 2019-09-14 ENCOUNTER — Other Ambulatory Visit: Payer: Self-pay

## 2019-09-14 ENCOUNTER — Other Ambulatory Visit: Payer: Self-pay | Admitting: Family Medicine

## 2019-09-14 DIAGNOSIS — I10 Essential (primary) hypertension: Secondary | ICD-10-CM | POA: Diagnosis not present

## 2019-09-14 LAB — BASIC METABOLIC PANEL
BUN: 15 mg/dL (ref 6–23)
CO2: 28 mEq/L (ref 19–32)
Calcium: 9.5 mg/dL (ref 8.4–10.5)
Chloride: 103 mEq/L (ref 96–112)
Creatinine, Ser: 0.86 mg/dL (ref 0.40–1.50)
GFR: 90.13 mL/min (ref >60.00)
Glucose, Bld: 109 mg/dL — ABNORMAL HIGH (ref 70–99)
Potassium: 4.2 mEq/L (ref 3.5–5.1)
Sodium: 140 mEq/L (ref 135–145)

## 2019-10-27 ENCOUNTER — Other Ambulatory Visit: Payer: Self-pay | Admitting: Family Medicine

## 2019-10-27 DIAGNOSIS — R7309 Other abnormal glucose: Secondary | ICD-10-CM

## 2019-10-27 DIAGNOSIS — Z125 Encounter for screening for malignant neoplasm of prostate: Secondary | ICD-10-CM

## 2019-10-27 DIAGNOSIS — E7800 Pure hypercholesterolemia, unspecified: Secondary | ICD-10-CM

## 2019-10-28 ENCOUNTER — Other Ambulatory Visit (INDEPENDENT_AMBULATORY_CARE_PROVIDER_SITE_OTHER): Payer: No Typology Code available for payment source

## 2019-10-28 ENCOUNTER — Other Ambulatory Visit: Payer: Self-pay

## 2019-10-28 DIAGNOSIS — R7309 Other abnormal glucose: Secondary | ICD-10-CM

## 2019-10-28 DIAGNOSIS — E7800 Pure hypercholesterolemia, unspecified: Secondary | ICD-10-CM | POA: Diagnosis not present

## 2019-10-28 DIAGNOSIS — Z125 Encounter for screening for malignant neoplasm of prostate: Secondary | ICD-10-CM | POA: Diagnosis not present

## 2019-10-28 LAB — LIPID PANEL
Cholesterol: 166 mg/dL (ref 0–200)
HDL: 48.6 mg/dL (ref >39.00)
LDL Cholesterol: 88 mg/dL (ref 0–99)
NonHDL: 117.67
Total CHOL/HDL Ratio: 3
Triglycerides: 146 mg/dL (ref 0.0–149.0)
VLDL: 29.2 mg/dL (ref 0.0–40.0)

## 2019-10-28 LAB — HEMOGLOBIN A1C: Hgb A1c MFr Bld: 5.8 % (ref 4.6–6.5)

## 2019-10-28 LAB — COMPREHENSIVE METABOLIC PANEL
ALT: 18 U/L (ref 0–53)
AST: 15 U/L (ref 0–37)
Albumin: 4.3 g/dL (ref 3.5–5.2)
Alkaline Phosphatase: 70 U/L (ref 39–117)
BUN: 14 mg/dL (ref 6–23)
CO2: 29 mEq/L (ref 19–32)
Calcium: 9 mg/dL (ref 8.4–10.5)
Chloride: 102 mEq/L (ref 96–112)
Creatinine, Ser: 0.81 mg/dL (ref 0.40–1.50)
GFR: 96.55 mL/min (ref >60.00)
Glucose, Bld: 123 mg/dL — ABNORMAL HIGH (ref 70–99)
Potassium: 4.2 mEq/L (ref 3.5–5.1)
Sodium: 138 mEq/L (ref 135–145)
Total Bilirubin: 0.7 mg/dL (ref 0.2–1.2)
Total Protein: 6.8 g/dL (ref 6.0–8.3)

## 2019-10-28 LAB — PSA: PSA: 1.4 ng/mL (ref 0.10–4.00)

## 2019-11-04 ENCOUNTER — Encounter: Payer: Self-pay | Admitting: Family Medicine

## 2019-11-04 ENCOUNTER — Ambulatory Visit (INDEPENDENT_AMBULATORY_CARE_PROVIDER_SITE_OTHER): Payer: No Typology Code available for payment source | Admitting: Family Medicine

## 2019-11-04 ENCOUNTER — Other Ambulatory Visit: Payer: Self-pay

## 2019-11-04 ENCOUNTER — Telehealth: Payer: Self-pay

## 2019-11-04 VITALS — BP 134/80 | HR 94 | Temp 97.9°F | Ht 70.75 in | Wt 218.0 lb

## 2019-11-04 DIAGNOSIS — M1611 Unilateral primary osteoarthritis, right hip: Secondary | ICD-10-CM

## 2019-11-04 DIAGNOSIS — I10 Essential (primary) hypertension: Secondary | ICD-10-CM

## 2019-11-04 DIAGNOSIS — E669 Obesity, unspecified: Secondary | ICD-10-CM

## 2019-11-04 DIAGNOSIS — Z0000 Encounter for general adult medical examination without abnormal findings: Secondary | ICD-10-CM

## 2019-11-04 DIAGNOSIS — E7800 Pure hypercholesterolemia, unspecified: Secondary | ICD-10-CM | POA: Diagnosis not present

## 2019-11-04 DIAGNOSIS — R600 Localized edema: Secondary | ICD-10-CM

## 2019-11-04 DIAGNOSIS — R7303 Prediabetes: Secondary | ICD-10-CM

## 2019-11-04 DIAGNOSIS — Z7189 Other specified counseling: Secondary | ICD-10-CM | POA: Diagnosis not present

## 2019-11-04 DIAGNOSIS — Z23 Encounter for immunization: Secondary | ICD-10-CM

## 2019-11-04 MED ORDER — BENAZEPRIL HCL 20 MG PO TABS
20.0000 mg | ORAL_TABLET | Freq: Every day | ORAL | 3 refills | Status: DC
Start: 2019-11-04 — End: 2020-06-29

## 2019-11-04 NOTE — Assessment & Plan Note (Signed)
Preventative protocols reviewed and updated unless pt declined. Discussed healthy diet and lifestyle.  

## 2019-11-04 NOTE — Assessment & Plan Note (Signed)
Reviewed limiting added sugars in diet.  

## 2019-11-04 NOTE — Patient Instructions (Addendum)
Flu shot today  I think next step for hips is seeing orthopedics to discuss surgery - let me know when interested. May try glucosamine or turmeric supplements which may help.  Let us know if interested in shingles vaccine (Shingrix).  You are doing well today. Return as needed or in 1 year for next physical.   Health Maintenance, Male Adopting a healthy lifestyle and getting preventive care are important in promoting health and wellness. Ask your health care provider about:  The right schedule for you to have regular tests and exams.  Things you can do on your own to prevent diseases and keep yourself healthy. What should I know about diet, weight, and exercise? Eat a healthy diet   Eat a diet that includes plenty of vegetables, fruits, low-fat dairy products, and lean protein.  Do not eat a lot of foods that are high in solid fats, added sugars, or sodium. Maintain a healthy weight Body mass index (BMI) is a measurement that can be used to identify possible weight problems. It estimates body fat based on height and weight. Your health care provider can help determine your BMI and help you achieve or maintain a healthy weight. Get regular exercise Get regular exercise. This is one of the most important things you can do for your health. Most adults should:  Exercise for at least 150 minutes each week. The exercise should increase your heart rate and make you sweat (moderate-intensity exercise).  Do strengthening exercises at least twice a week. This is in addition to the moderate-intensity exercise.  Spend less time sitting. Even light physical activity can be beneficial. Watch cholesterol and blood lipids Have your blood tested for lipids and cholesterol at 62 years of age, then have this test every 5 years. You may need to have your cholesterol levels checked more often if:  Your lipid or cholesterol levels are high.  You are older than 62 years of age.  You are at high risk for  heart disease. What should I know about cancer screening? Many types of cancers can be detected early and may often be prevented. Depending on your health history and family history, you may need to have cancer screening at various ages. This may include screening for:  Colorectal cancer.  Prostate cancer.  Skin cancer.  Lung cancer. What should I know about heart disease, diabetes, and high blood pressure? Blood pressure and heart disease  High blood pressure causes heart disease and increases the risk of stroke. This is more likely to develop in people who have high blood pressure readings, are of African descent, or are overweight.  Talk with your health care provider about your target blood pressure readings.  Have your blood pressure checked: ? Every 3-5 years if you are 26-48 years of age. ? Every year if you are 82 years old or older.  If you are between the ages of 12 and 13 and are a current or former smoker, ask your health care provider if you should have a one-time screening for abdominal aortic aneurysm (AAA). Diabetes Have regular diabetes screenings. This checks your fasting blood sugar level. Have the screening done:  Once every three years after age 55 if you are at a normal weight and have a low risk for diabetes.  More often and at a younger age if you are overweight or have a high risk for diabetes. What should I know about preventing infection? Hepatitis B If you have a higher risk for hepatitis B,  you should be screened for this virus. Talk with your health care provider to find out if you are at risk for hepatitis B infection. Hepatitis C Blood testing is recommended for:  Everyone born from [birth year] through 1963.  Anyone with known risk factors for hepatitis C. Sexually transmitted infections (STIs)  You should be screened each year for STIs, including gonorrhea and chlamydia, if: ? You are sexually active and are younger than 62 years of age. ? You are  older than 63 years of age and your health care provider tells you that you are at risk for this type of infection. ? Your sexual activity has changed since you were last screened, and you are at increased risk for chlamydia or gonorrhea. Ask your health care provider if you are at risk.  Ask your health care provider about whether you are at high risk for HIV. Your health care provider may recommend a prescription medicine to help prevent HIV infection. If you choose to take medicine to prevent HIV, you should first get tested for HIV. You should then be tested every 3 months for as long as you are taking the medicine. Follow these instructions at home: Lifestyle  Do not use any products that contain nicotine or tobacco, such as cigarettes, e-cigarettes, and chewing tobacco. If you need help quitting, ask your health care provider.  Do not use street drugs.  Do not share needles.  Ask your health care provider for help if you need support or information about quitting drugs. Alcohol use  Do not drink alcohol if your health care provider tells you not to drink.  If you drink alcohol: ? Limit how much you have to 0-2 drinks a day. ? Be aware of how much alcohol is in your drink. In the [place] one drink equals one 12 oz bottle of beer (355 mL), one 5 oz glass of wine (148 mL), or one 1 oz glass of hard liquor (44 mL). General instructions  Schedule regular health, dental, and eye exams.  Stay current with your vaccines.  Tell your health care provider if: ? You often feel depressed. ? You have ever been abused or do not feel safe at home. Summary  Adopting a healthy lifestyle and getting preventive care are important in promoting health and wellness.  Follow your health care provider's instructions about healthy diet, exercising, and getting tested or screened for diseases.  Follow your health care provider's instructions on monitoring your cholesterol and blood pressure. This  information is not intended to replace advice given to you by your health care provider. Make sure you discuss any questions you have with your health care provider. Document Revised: 01/08/2018 Document Reviewed: 01/08/2018 Elsevier Patient Education  2020 [employer].

## 2019-11-04 NOTE — Assessment & Plan Note (Signed)
Advanced directive: not set up. Packet provided today. Would want wife Andre [name] to be HCPOA.

## 2019-11-04 NOTE — Progress Notes (Signed)
This visit was conducted in person.  BP 134/80 (BP Location: Left Arm, Patient Position: Sitting, Cuff Size: Normal)   Pulse 94   Temp 97.9 F (36.6 C) (Temporal)   Ht 5' 10.75" (1.797 m)   Wt 218 lb (98.9 kg)   SpO2 96%   BMI 30.62 kg/m    CC: CPE Subjective:    Patient ID: Jerrell Mangel, male    DOB: November 21, 1955, 64 y.o.   MRN: [id number]  HPI: Alven Alverio is a 64 y.o. male presenting on 11/04/2019 for Annual Exam   Recovered from [place] infection 03/2019.  HTN - well controlled with benazepril 10mg  daily. Pedal edema has improved off amlodipine.   Ongoing R hip groin pain - worse after 10 hour work day on his feet. He saw PT x2 - was unable to continue due to work schedule changes and cost ($150/session). He hsa continued HEP that he was shown. Hasn't noted strength improvement. X-rays 08/2019 showed severe arthritis of both hips. Not to the point wanting surgery.   10 lb weight loss since 05/2019 - trying to eat healthier.   Preventative: COLONOSCOPY Date: 01/2012 tubular adenoma Ardis Hughs), rpt 5 yrs COLONOSCOPY 03/2017 - hyperplastic polyp, int hem, rpt 5 yrs Ardis Hughs) Prostate cancer screening - requests continuedyearly. Recent PSA reassuring.  Flu shot - yearly Td 2007, Tdap 2017 COVID vaccine - [place] 07/2019 x2 Shingrix - discussed.  Advanced directive: not set up. Packet provided today. Would want wife Jolet to be HCPOA.  Seat belt use discussed. Sunscreen use discussed. No changing moles on skin.  Non smoker. Wife smokes outside.  Alcohol -occasional.  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 Interim medical history since our last visit  reviewed. Allergies and medications reviewed and updated. Outpatient Medications Prior to Visit  Medication Sig Dispense Refill  . Multiple Vitamins-Minerals (MULTIVITAMIN & MINERAL PO) Take 1 tablet by mouth daily.    . benazepril (LOTENSIN) 20 MG tablet Take 1 tablet (20 mg total) by mouth daily. 30 tablet 6   No facility-administered medications prior to visit.     Per HPI unless specifically indicated in ROS section below Review of Systems  Constitutional: Negative for activity change, appetite change, chills, fatigue, fever and unexpected weight change.  HENT: Negative for hearing loss.   Eyes: Negative for visual disturbance.  Respiratory: Negative for cough, chest tightness, shortness of breath and wheezing.   Cardiovascular: Negative for chest pain, palpitations and leg swelling.  Gastrointestinal: Negative for abdominal distention, abdominal pain, blood in stool, constipation, diarrhea, nausea and vomiting.  Genitourinary: Negative for difficulty urinating and hematuria.  Musculoskeletal: Negative for arthralgias, myalgias and neck pain.  Skin: Negative for rash.  Neurological: Negative for dizziness, seizures, syncope and headaches.  Hematological: Negative for adenopathy. Does not bruise/bleed easily.  Psychiatric/Behavioral: Negative for dysphoric mood. The patient is not nervous/anxious.    Objective:  BP 134/80 (BP Location: Left Arm, Patient Position: Sitting, Cuff Size: Normal)   Pulse 94   Temp 97.9 F (36.6 C) (Temporal)   Ht 5' 10.75" (1.797 m)   Wt 218 lb (98.9 kg)   SpO2 96%   BMI 30.62 kg/m  Wt Readings from Last 3 Encounters:  11/04/19 218 lb (98.9 kg)  09/02/19 224 lb 6 oz (101.8 kg)  06/25/19 228 lb 3 oz (103.5 kg)      Physical Exam Vitals and nursing note reviewed.  Constitutional:      General: He is not in acute distress.    Appearance: Normal appearance. He is well-developed. He is not ill-appearing.  HENT:     Head: Normocephalic and  atraumatic.     Right Ear: Hearing, tympanic membrane, ear canal and external ear normal.     Left Ear: Hearing, tympanic membrane, ear canal and external ear normal.  Eyes:     General: No scleral icterus.    Extraocular Movements: Extraocular movements intact.     Conjunctiva/sclera: Conjunctivae normal.     Pupils: Pupils are equal, round, and reactive to light.  Neck:     Thyroid: No thyroid mass or thyromegaly.  Cardiovascular:     Rate and Rhythm: Normal rate and regular rhythm.     Pulses: Normal pulses.          Radial pulses are 2+ on the right side and 2+ on the left side.     Heart sounds: Normal heart sounds. No murmur heard.   Pulmonary:     Effort: Pulmonary effort is normal. No respiratory distress.     Breath sounds: Normal breath sounds. No wheezing, rhonchi or rales.  Abdominal:     General: Abdomen is flat. Bowel sounds are normal. There is no distension.     Palpations: Abdomen is soft. There is no mass.     Tenderness: There is no abdominal tenderness. There is no guarding or rebound.     Hernia: No hernia is present.  Musculoskeletal:        General: Normal range of motion.     Cervical back: Normal range of motion and neck supple.     Right lower leg: No edema.     Left lower leg: No edema.     Comments: Crepitus of R shoulder with ROM  Lymphadenopathy:     Cervical: No cervical adenopathy.  Skin:    General: Skin is warm and dry.     Findings: No rash.  Neurological:     General: No focal deficit present.     Mental Status: He is alert and oriented to person, place, and time.     Comments: CN grossly intact, station and gait intact  Psychiatric:        Mood and Affect: Mood normal.        Behavior: Behavior normal.        Thought Content: Thought content normal.        Judgment: Judgment normal.       Results for orders placed or performed in visit on 10/28/19  PSA  Result Value Ref Range   PSA 1.40 0.10 - 4.00 ng/mL  Hemoglobin A1c  Result  Value Ref Range   Hgb A1c MFr Bld 5.8 4.6 - 6.5 %  Comprehensive metabolic panel  Result Value Ref Range   Sodium 138 135 - 145 mEq/L   Potassium 4.2 3.5 - 5.1 mEq/L   Chloride 102 96 - 112 mEq/L   CO2 29 19 - 32 mEq/L   Glucose, Bld 123 (H) 70 - 99 mg/dL   BUN 14 6 - 23 mg/dL   Creatinine, Ser 0.81 0.40 - 1.50 mg/dL   Total Bilirubin 0.7 0.2 - 1.2 mg/dL   Alkaline Phosphatase 70  39 - 117 U/L   AST 15 0 - 37 U/L   ALT 18 0 - 53 U/L   Total Protein 6.8 6.0 - 8.3 g/dL   Albumin 4.3 3.5 - 5.2 g/dL   GFR 96.55 >60.00 mL/min   Calcium 9.0 8.4 - 10.5 mg/dL  Lipid panel  Result Value Ref Range   Cholesterol 166 0 - 200 mg/dL   Triglycerides 146.0 0 - 149 mg/dL   HDL 48.60 >39.00 mg/dL   VLDL 29.2 0.0 - 40.0 mg/dL   LDL Cholesterol 88 0 - 99 mg/dL   Total CHOL/HDL Ratio 3    NonHDL 117.67    Assessment & Plan:  This visit occurred during the SARS-CoV-2 public health emergency.  Safety protocols were in place, including screening questions prior to the visit, additional usage of staff PPE, and extensive cleaning of exam room while observing appropriate contact time as indicated for disinfecting solutions.   Will return for nurse visit for shingrix.  Problem List Items Addressed This Visit    Prediabetes    Reviewed limiting added sugars in diet.       RESOLVED: Pedal edema    Improved off amlodipine.       Osteoarthritis of right hip    xrays showing severe OA.  S/p PT sessions earlier this year. Continues HEP.  Discussed ortho referral - he will let us know when ready.  Will try glucosamine or turmeric in interim.       Obesity, Class I, BMI 30.0-34.9 (see actual BMI)    Congratulated on weight loss to date, encouraged continued efforts. Overall healthy diet.       HYPERCHOLESTEROLEMIA    Chronic, improved with healthy diet choices. Not on statin.  The 10-year ASCVD risk score Mikey Bussing DC Brooke Bonito., et al., 2013) is: 11.1%   Values used to calculate the score:     Age: 30  years     Sex: Male     Is Non-Hispanic African American: No     Diabetic: No     Tobacco smoker: No     Systolic Blood Pressure: 161 mmHg     Is BP treated: Yes     HDL Cholesterol: 48.6 mg/dL     Total Cholesterol: 166 mg/dL       Relevant Medications   benazepril (LOTENSIN) 20 MG tablet   Health maintenance examination - Primary    Preventative protocols reviewed and updated unless pt declined. Discussed healthy diet and lifestyle.       Essential hypertension, benign    Chronic, stable. Continue current regimen.       Relevant Medications   benazepril (LOTENSIN) 20 MG tablet   Advanced directives, counseling/discussion    Advanced directive: not set up. Packet provided today. Would want wife Jolet to be HCPOA.        Other Visit Diagnoses    Need for influenza vaccination       Relevant Orders   Flu Vaccine QUAD 36+ mos IM (Completed)       Meds ordered this encounter  Medications  . benazepril (LOTENSIN) 20 MG tablet    Sig: Take 1 tablet (20 mg total) by mouth daily.    Dispense:  90 tablet    Refill:  3   Orders Placed This Encounter  Procedures  . Flu Vaccine QUAD 36+ mos IM    Patient instructions: Flu shot today  I think next step for hips is seeing orthopedics to discuss surgery - let me  know when interested. May try glucosamine or turmeric supplements which may help.  Let us know if interested in shingles vaccine (Shingrix).  You are doing well today. Return as needed or in 1 year for next physical.   Follow up plan: Return if symptoms worsen or fail to improve, for annual exam, prior fasting for blood work.  Ria Bush, MD

## 2019-11-04 NOTE — Assessment & Plan Note (Signed)
Improved off amlodipine. 

## 2019-11-04 NOTE — Assessment & Plan Note (Signed)
Chronic, improved with healthy diet choices. Not on statin.  The 10-year ASCVD risk score Mikey Bussing DC Brooke Bonito., et al., 2013) is: 11.1%   Values used to calculate the score:     Age: 64 years     Sex: Male     Is Non-Hispanic African American: No     Diabetic: No     Tobacco smoker: No     Systolic Blood Pressure: 022 mmHg     Is BP treated: Yes     HDL Cholesterol: 48.6 mg/dL     Total Cholesterol: 166 mg/dL

## 2019-11-04 NOTE — Assessment & Plan Note (Signed)
Congratulated on weight loss to date, encouraged continued efforts. Overall healthy diet.

## 2019-11-04 NOTE — Assessment & Plan Note (Signed)
xrays showing severe OA.  S/p PT sessions earlier this year. Continues HEP.  Discussed ortho referral - he will let us know when ready.  Will try glucosamine or turmeric in interim.

## 2019-11-04 NOTE — Telephone Encounter (Signed)
Lvm asking pt to call back.  We forgot to give pt an Advance Directives packet to complete and bring back.  Does he want to pick it up or have it mailed?

## 2019-11-04 NOTE — Assessment & Plan Note (Signed)
Chronic, stable. Continue current regimen. 

## 2019-11-05 NOTE — Telephone Encounter (Signed)
Lvm asking pt to call back.  We forgot to give pt an Advance Directives packet to complete and bring back.  Does he want to pick it up or have it mailed?

## 2019-11-05 NOTE — Telephone Encounter (Signed)
[facility] Night - Client Nonclinical Telephone Record  [facility] Client [facility] Night - Client Client Site [place] Physician Ria Bush - MD Contact Type Call Who Is Calling Patient / Member / Family / Caregiver Caller Name Jaquarious Grey Phone Number [phone] Call Type Message Only Information Provided Reason for Call Returning a Call from the Office Initial Comment The caller is returning a call from the office. Disp. Time Disposition Final User 11/04/2019 5:21:43 PM General Information Provided Yes Carlis Stable Call Closed By: Carlis Stable Transaction Date/Time: 11/04/2019 5:20:19 PM (ET)

## 2019-11-06 NOTE — Telephone Encounter (Addendum)
Mailed Advance Directives packet to pt.  Pt needs to complete packet, then return it to our office.

## 2019-11-24 ENCOUNTER — Other Ambulatory Visit: Payer: Self-pay

## 2019-11-24 ENCOUNTER — Ambulatory Visit (INDEPENDENT_AMBULATORY_CARE_PROVIDER_SITE_OTHER): Payer: No Typology Code available for payment source

## 2019-11-24 DIAGNOSIS — Z23 Encounter for immunization: Secondary | ICD-10-CM | POA: Diagnosis not present

## 2019-11-25 ENCOUNTER — Ambulatory Visit: Payer: No Typology Code available for payment source

## 2019-12-30 ENCOUNTER — Other Ambulatory Visit: Payer: Self-pay

## 2019-12-30 ENCOUNTER — Emergency Department (HOSPITAL_BASED_OUTPATIENT_CLINIC_OR_DEPARTMENT_OTHER): Payer: PRIVATE HEALTH INSURANCE

## 2019-12-30 ENCOUNTER — Emergency Department (HOSPITAL_BASED_OUTPATIENT_CLINIC_OR_DEPARTMENT_OTHER)
Admission: EM | Admit: 2019-12-30 | Discharge: 2019-12-30 | Disposition: A | Discharge status: Home / Self Care | Payer: PRIVATE HEALTH INSURANCE | Attending: Emergency Medicine | Admitting: Emergency Medicine

## 2019-12-30 ENCOUNTER — Encounter (HOSPITAL_BASED_OUTPATIENT_CLINIC_OR_DEPARTMENT_OTHER): Payer: Self-pay | Admitting: *Deleted

## 2019-12-30 DIAGNOSIS — I10 Essential (primary) hypertension: Secondary | ICD-10-CM | POA: Diagnosis not present

## 2019-12-30 DIAGNOSIS — S9032XA Contusion of left foot, initial encounter: Secondary | ICD-10-CM

## 2019-12-30 DIAGNOSIS — S9031XA Contusion of right foot, initial encounter: Secondary | ICD-10-CM | POA: Insufficient documentation

## 2019-12-30 DIAGNOSIS — Z8616 Personal history of COVID-19: Secondary | ICD-10-CM | POA: Insufficient documentation

## 2019-12-30 DIAGNOSIS — Y990 Civilian activity done for income or pay: Secondary | ICD-10-CM | POA: Insufficient documentation

## 2019-12-30 DIAGNOSIS — W208XXA Other cause of strike by thrown, projected or falling object, initial encounter: Secondary | ICD-10-CM | POA: Insufficient documentation

## 2019-12-30 DIAGNOSIS — Z79899 Other long term (current) drug therapy: Secondary | ICD-10-CM | POA: Diagnosis not present

## 2019-12-30 DIAGNOSIS — S99921A Unspecified injury of right foot, initial encounter: Secondary | ICD-10-CM | POA: Diagnosis present

## 2019-12-30 NOTE — Discharge Instructions (Addendum)
While in the emergency room your blood pressure was elevated.  Please get this rechecked in the next few weeks as if its high you may need changes to your medications.    Please elevate your foot above your heart when ever possible.    Please take Ibuprofen (Advil, motrin) and Tylenol (acetaminophen) to relieve your pain.  You may take up to 600 MG (3 pills) of normal strength ibuprofen every 8 hours as needed.  In between doses of ibuprofen you make take tylenol, up to 1,000 mg (two extra strength pills).  Do not take more than 3,000 mg tylenol in a 24 hour period.  Please check all medication labels as many medications such as pain and cold medications may contain tylenol.  Do not drink alcohol while taking these medications.  Do not take other NSAID'S while taking ibuprofen (such as aleve or naproxen).  Please take ibuprofen with food to decrease stomach upset.

## 2019-12-30 NOTE — ED Provider Notes (Signed)
[facility] EMERGENCY DEPARTMENT Provider Note   CSN: [id number] Arrival date & time: 12/30/19  1333     History Chief Complaint  Patient presents with  . Foot Injury    Andre [name] is a 64 y.o. male with a past medical history of hypertension, who presents today for evaluation of pain in his left foot.  He reports that about 30 minutes prior to arrival he was rolling a 60 pound oil drum and it ended up rolling over his left foot.  He denies any other injuries.  He does not have a history of diabetes.  He is concerned that his foot may be broken. He states that he has crutches at home that he can use if needed.  His pain is made worse with weightbearing.  Made better with elevation, ice, and being still.  HPI     Past Medical History:  Diagnosis Date  . COVID-19 virus infection 01/2019  . Essential hypertension, benign   . History of colon polyps 2014  . Nephrolithiasis 06/2014   ER visit  . Pure hypercholesterolemia     Patient Active Problem List   Diagnosis Date Noted  . Advanced directives, counseling/discussion 11/04/2019  . Obesity, Class I, BMI 30.0-34.9 (see actual BMI) 11/04/2019  . Osteoarthritis of right hip 09/02/2019  . Right-sided low back pain with right-sided sciatica 06/25/2019  . COVID-19 virus infection 03/02/2019  . Acute left-sided low back pain without sciatica 01/11/2018  . Erectile dysfunction 01/11/2018  . Epidermal inclusion cyst 12/13/2014  . Gross hematuria 05/03/2014  . Achilles tendon pain 12/31/2013  . Rosacea 04/20/2010  . Health maintenance examination 04/20/2010  . Prediabetes 09/06/2008  . Essential hypertension, benign 12/12/2006  . HYPERCHOLESTEROLEMIA 11/25/2006    Past Surgical History:  Procedure Laterality Date  . COLONOSCOPY  01/2012   tubular adenoma Ardis Hughs), rpt 5 yrs  . COLONOSCOPY  03/2017   hyperplastic polyp, int hem, rpt 5 yrs Ardis Hughs)  . TONSILLECTOMY     as a child       Family History    Problem Relation Age of Onset  . Colon cancer Sister 86  . Breast cancer Sister 75  . Hypertension Mother   . HIV Brother   . Kidney disease Sister   . Stomach cancer Neg Hx   . CAD Neg Hx   . Stroke Neg Hx   . Diabetes Neg Hx     Social History   Tobacco Use  . Smoking status: Never Smoker  . Smokeless tobacco: Never Used  Vaping Use  . Vaping Use: Never used  Substance Use Topics  . Alcohol use: Yes    Alcohol/week: 3.0 standard drinks    Types: 3 Glasses of wine per week  . Drug use: No    Home Medications Prior to Admission medications   Medication Sig Start Date End Date Taking? Authorizing Provider  benazepril (LOTENSIN) 20 MG tablet Take 1 tablet (20 mg total) by mouth daily. 11/04/19   Ria Bush, MD  Multiple Vitamins-Minerals (MULTIVITAMIN & MINERAL PO) Take 1 tablet by mouth daily.    [provider]    Allergies    Dust mite extract  Review of Systems   Review of Systems  Constitutional: Negative for chills and fever.  Musculoskeletal:       Left foot swelling  Skin: Negative for wound.  All other systems reviewed and are negative.   Physical Exam Updated Vital Signs BP (!) 167/97   Pulse 85  Temp 98 F (36.7 C) (Oral)   Resp 16   Ht 6' (1.829 m)   Wt 101.2 kg   SpO2 100%   BMI 30.24 kg/m   Physical Exam Vitals and nursing note reviewed.  Constitutional:      General: He is not in acute distress. HENT:     Head: Normocephalic and atraumatic.  Cardiovascular:     Rate and Rhythm: Normal rate.     Pulses: Normal pulses.          Dorsalis pedis pulses are 2+ on the left side.       Posterior tibial pulses are 2+ on the left side.  Pulmonary:     Effort: Pulmonary effort is normal. No respiratory distress.  Musculoskeletal:     Cervical back: No rigidity.     Right lower leg: No edema.     Left lower leg: No edema.     Comments: There is obvious edema of the left lateral foot.  No obvious crepitus or deformity.  No  tenderness over the bilateral malleoli or proximal left lower leg.  No tenderness over left toes.  Skin:    Comments: Contusion on left lateral foot with minimal ecchymosis.  No obvious laceration.  Minimal abrasion not entirely through the dermis without obvious bleeding.  Neurological:     Mental Status: He is alert. Mental status is at baseline.     Comments: Awake and alert, answers all questions appropriately.  Speech is not slurred.    Psychiatric:        Mood and Affect: Mood normal.     ED Results / Procedures / Treatments   Labs (all labs ordered are listed, but only abnormal results are displayed) Labs Reviewed - No data to display  EKG None  Radiology DG Foot Complete Left  Result Date: 12/30/2019 CLINICAL DATA:  Left foot injury due to dropping a 30 pound weight on the foot today. Initial encounter. EXAM: LEFT FOOT - COMPLETE 3+ VIEW COMPARISON:  None. FINDINGS: No acute bony or joint abnormality is identified. The patient has a remote healed fracture of the proximal phalanx of the little toe. Small plantar calcaneal spur noted. Soft tissues are unremarkable. IMPRESSION: No acute abnormality. Remote healed fracture proximal phalanx of the little toe. Electronically Signed   By: Inge Rise M.D.   On: 12/30/2019 14:42    Procedures Procedures (including critical care time)  Medications Ordered in ED Medications - No data to display  ED Course  I have reviewed the triage vital signs and the nursing notes.  Pertinent labs & imaging results that were available during my care of the patient were reviewed by me and considered in my medical decision making (see chart for details).    MDM Rules/Calculators/A&P                         Patient is a 64 year old man who presents today for evaluation of pain in his left foot after he accidentally rolled a oil drawn over his foot shortly prior to arrival.  X-rays were obtained without fracture or other acute osseous  abnormality.  On exam he is significant lateral left foot contusion.  He is neurovascularly intact.  There is minimal superficial abrasion that is not even fully through the dermis on the lateral foot.  We discussed treatment options.  He has crutches at home.  We will place Ace wrap, postop shoe. We discussed OTC medications as needed for  pain.  We discussed the importance of price.  We did discuss additionally that in rare cases there can be missed fractures in the foot.  I recommended that if his symptoms are not improving in 7 days he follow-up with either sports medicine or PCP for repeat imaging.  Return precautions were discussed with patient who states their understanding.  At the time of discharge patient denied any unaddressed complaints or concerns.  Patient is agreeable for discharge home.  Note: Portions of this report may have been transcribed using voice recognition software. Every effort was made to ensure accuracy; however, inadvertent computerized transcription errors may be present  Final Clinical Impression(s) / ED Diagnoses Final diagnoses:  Contusion of left foot, initial encounter  Hypertension, unspecified type    Rx / DC Orders ED Discharge Orders    None       Ollen Gross 12/30/19 1541    Drenda Freeze, MD 12/30/19 2312

## 2019-12-30 NOTE — ED Triage Notes (Signed)
C/o left foot injury , 30 lbs weight dropped on top of foot x 30 mins ago at work

## 2020-01-08 ENCOUNTER — Other Ambulatory Visit: Payer: Self-pay

## 2020-01-08 ENCOUNTER — Ambulatory Visit: Payer: PRIVATE HEALTH INSURANCE | Admitting: Family Medicine

## 2020-01-08 ENCOUNTER — Ambulatory Visit (INDEPENDENT_AMBULATORY_CARE_PROVIDER_SITE_OTHER)
Admission: RE | Admit: 2020-01-08 | Discharge: 2020-01-08 | Disposition: A | Discharge status: Home / Self Care | Payer: PRIVATE HEALTH INSURANCE | Source: Ambulatory Visit | Attending: Family Medicine | Admitting: Family Medicine

## 2020-01-08 ENCOUNTER — Encounter: Payer: Self-pay | Admitting: Family Medicine

## 2020-01-08 VITALS — BP 156/90 | HR 87 | Temp 98.1°F | Ht 72.0 in | Wt 225.4 lb

## 2020-01-08 DIAGNOSIS — I10 Essential (primary) hypertension: Secondary | ICD-10-CM

## 2020-01-08 DIAGNOSIS — S9782XA Crushing injury of left foot, initial encounter: Secondary | ICD-10-CM

## 2020-01-08 MED ORDER — NAPROXEN 375 MG PO TABS: 375.0000 mg | ORAL_TABLET | Freq: Two times a day (BID) | ORAL | 0 refills | Status: DC

## 2020-01-08 NOTE — Assessment & Plan Note (Addendum)
BP elevated today - attributed to NSAID use and pain.  Will continue to monitor, encouraged limiting sodium in diet and increasing water intake while on NSAID.

## 2020-01-08 NOTE — Assessment & Plan Note (Signed)
Crushing injury suffered 10 days ago.  Rpt L foot xray today - no obvious fracture.  Not consistent with compartment syndrome.  However slow recovery. Recommend out of work for the next week with judicious elevation of foot, discussed ice, NSAID, tylenol, wrap.  Update if not continuing to improve.

## 2020-01-08 NOTE — Patient Instructions (Addendum)
Left foot xray today. No obvious fracture. We will await radiology read.  Recommend out of work for the next week - elevate foot, continue ice but not directly on skin for 10-15 min three times a day, take naprosyn 375mg  times daily with meals for 3-5 days then drop dose as needed, continue tylenol 1000mg  three times daily as needed.  Let us know if not continuing to improve each day, return for recheck if that is the case.

## 2020-01-08 NOTE — Progress Notes (Signed)
[name], male    DOB: July 26, 1955, 64 y.o.   MRN: [id number]  This visit was conducted in person.  BP (!) 156/90 (BP Location: Right Arm, Patient Position: Sitting, Cuff Size: Large)   Pulse 87   Temp 98.1 F (36.7 C) (Temporal)   Ht 6' (1.829 m)   Wt 225 lb 7 oz (102.3 kg)   SpO2 95%   BMI 30.57 kg/m   Elevated on repeat testing.   CC: ER f/u visit  Subjective:   HPI: Andre [name] is a 64 y.o. male presenting on 01/08/2020 for Hospitalization Follow-up (Seen on 12/30/19 at [facility] ED for left foot contusion.)   DOI: 12/30/2019 Recent ER visit after rolling 55 gallon drum (500 lbs) wheel caught L foot (records reviewed). Xray did not show fracture. Dx bony contusion. Treated with ace wrap, postop shoe, OTC pain medications.  Worst pain first few steps of the day, especially dorsal foot.   Managing pain with 400mg  ibuprofen TID and tylenol 1000mg  TID. Also using ice, elevating foot, ace wrap.  Pain not well controlled.  No h/o gout.  He restarted working 01/04/2020 - foot remains sore.   BP elevation noted today - he's in pain and took NSAID this morning.  Taking benazepril 20mg  daily.      Relevant past medical, surgical, family and social history reviewed and updated as indicated. Interim medical history since our last visit reviewed. Allergies and medications reviewed and updated. Outpatient Medications Prior to Visit  Medication Sig Dispense Refill  . benazepril (LOTENSIN) 20 MG tablet Take 1 tablet (20 mg total) by mouth daily. 90 tablet 3  . Multiple Vitamins-Minerals (MULTIVITAMIN & MINERAL PO) Take 1 tablet by mouth daily.     No facility-administered medications prior to visit.     Per HPI unless specifically indicated in ROS section below Review of Systems Objective:  BP (!) 156/90 (BP Location: Right Arm, Patient Position: Sitting, Cuff Size: Large)   Pulse 87   Temp 98.1 F (36.7 C) (Temporal)   Ht 6' (1.829 m)   Wt 225 lb 7 oz (102.3  kg)   SpO2 95%   BMI 30.57 kg/m   Wt Readings from Last 3 Encounters:  01/08/20 225 lb 7 oz (102.3 kg)  12/30/19 223 lb (101.2 kg)  11/04/19 218 lb (98.9 kg)      Physical Exam Vitals and nursing note reviewed.  Constitutional:      Appearance: Normal appearance. He is not ill-appearing.  Musculoskeletal:        General: Swelling, tenderness and signs of injury present. Normal range of motion.     Right lower leg: No edema.     Left lower leg: No edema.       Feet:     Comments:  Marked swelling to dorsal L foot, spares toes, but bruising present at toes.  Marked tenderness to palpation dorsolateral foot from ankle down to MTPJs, point tenderness lateral dorsal foot  2+ DP bilaterally  No pain at base of 5th MT  Skin:    General: Skin is warm and dry.     Findings: Bruising (to L toes) present. No rash.     Comments: Brisk capillary refill, sensation intact   Neurological:     Mental Status: He is alert.       Lab Results  Component Value Date   CREATININE 0.81 10/28/2019   BUN 14 10/28/2019   NA 138 10/28/2019   K 4.2 10/28/2019  CL 102 10/28/2019   CO2 29 10/28/2019    DG Foot Complete Left CLINICAL DATA:  Left foot injury due to dropping a 30 pound weight on the foot today. Initial encounter.  EXAM: LEFT FOOT - COMPLETE 3+ VIEW  COMPARISON:  None.  FINDINGS: No acute bony or joint abnormality is identified. The patient has a remote healed fracture of the proximal phalanx of the little toe. Small plantar calcaneal spur noted. Soft tissues are unremarkable.  IMPRESSION: No acute abnormality.  Remote healed fracture proximal phalanx of the little toe.  Electronically Signed   By: Inge Rise M.D.   On: 12/30/2019 14:42   Assessment & Plan:  This visit occurred during the SARS-CoV-2 public health emergency.  Safety protocols were in place, including screening questions prior to the visit, additional usage of staff PPE, and extensive cleaning of  exam room while observing appropriate contact time as indicated for disinfecting solutions.   Problem List Items Addressed This Visit    Essential hypertension, benign    BP elevated today - attributed to NSAID use and pain.  Will continue to monitor, encouraged limiting sodium in diet and increasing water intake while on NSAID.       Crushing injury of left foot - Primary    Crushing injury suffered 10 days ago.  Rpt L foot xray today - no obvious fracture.  Not consistent with compartment syndrome.  However slow recovery. Recommend out of work for the next week with judicious elevation of foot, discussed ice, NSAID, tylenol, wrap.  Update if not continuing to improve.       Relevant Orders   DG Foot Complete Left       Meds ordered this encounter  Medications  . naproxen (NAPROSYN) 375 MG tablet    Sig: Take 1 tablet (375 mg total) by mouth 2 (two) times daily with a meal.    Dispense:  40 tablet    Refill:  0    To take in place of ibuprofen   Orders Placed This Encounter  Procedures  . DG Foot Complete Left    Standing Status:   Future    Number of Occurrences:   1    Standing Expiration Date:   01/07/2021    Order Specific Question:   Reason for Exam (SYMPTOM  OR DIAGNOSIS REQUIRED)    Answer:   crushing injury 1+ wk ago    Order Specific Question:   Preferred imaging location?    Answer:   [place]    Patient Instructions  Left foot xray today. No obvious fracture. We will await radiology read.  Recommend out of work for the next week - elevate foot, continue ice but not directly on skin for 10-15 min three times a day, take naprosyn 375mg  times daily with meals for 3-5 days then drop dose as needed, continue tylenol 1000mg  three times daily as needed.  Let us know if not continuing to improve each day, return for recheck if that is the case.   Follow up plan: Return if symptoms worsen or fail to improve.  Ria Bush, MD

## 2020-06-28 ENCOUNTER — Other Ambulatory Visit: Payer: Self-pay | Admitting: Family Medicine

## 2020-07-31 ENCOUNTER — Other Ambulatory Visit: Payer: Self-pay | Admitting: Family Medicine

## 2020-11-07 ENCOUNTER — Other Ambulatory Visit: Payer: Self-pay

## 2020-11-07 ENCOUNTER — Ambulatory Visit (INDEPENDENT_AMBULATORY_CARE_PROVIDER_SITE_OTHER): Payer: BLUE CROSS/BLUE SHIELD | Admitting: Family Medicine

## 2020-11-07 ENCOUNTER — Encounter: Payer: Self-pay | Admitting: Family Medicine

## 2020-11-07 VITALS — BP 140/68 | HR 84 | Temp 98.0°F | Ht 71.5 in | Wt 229.2 lb

## 2020-11-07 DIAGNOSIS — Z125 Encounter for screening for malignant neoplasm of prostate: Secondary | ICD-10-CM | POA: Diagnosis not present

## 2020-11-07 DIAGNOSIS — R7303 Prediabetes: Secondary | ICD-10-CM

## 2020-11-07 DIAGNOSIS — I10 Essential (primary) hypertension: Secondary | ICD-10-CM

## 2020-11-07 DIAGNOSIS — Z23 Encounter for immunization: Secondary | ICD-10-CM

## 2020-11-07 DIAGNOSIS — E7800 Pure hypercholesterolemia, unspecified: Secondary | ICD-10-CM | POA: Diagnosis not present

## 2020-11-07 DIAGNOSIS — E669 Obesity, unspecified: Secondary | ICD-10-CM

## 2020-11-07 DIAGNOSIS — Z0000 Encounter for general adult medical examination without abnormal findings: Secondary | ICD-10-CM

## 2020-11-07 DIAGNOSIS — E66811 Obesity, class 1: Secondary | ICD-10-CM

## 2020-11-07 MED ORDER — BENAZEPRIL HCL 20 MG PO TABS
20.0000 mg | ORAL_TABLET | Freq: Every day | ORAL | 3 refills | Status: DC
Start: 2020-11-07 — End: 2021-12-06

## 2020-11-07 NOTE — Assessment & Plan Note (Signed)
Encouraged healthy diet and lifestyle choices to affect sustainable weight loss.  ?

## 2020-11-07 NOTE — Assessment & Plan Note (Signed)
Chronic, off statin. Update FLP today . The 10-year ASCVD risk score (Arnett DK, et al., 2019) is: 13.1%   Values used to calculate the score:     Age: 65 years     Sex: Male     Is Non-Hispanic African American: No     Diabetic: No     Tobacco smoker: No     Systolic Blood Pressure: 229 mmHg     Is BP treated: Yes     HDL Cholesterol: 48.6 mg/dL     Total Cholesterol: 166 mg/dL

## 2020-11-07 NOTE — Assessment & Plan Note (Signed)
Preventative protocols reviewed and updated unless pt declined. Discussed healthy diet and lifestyle.  

## 2020-11-07 NOTE — Patient Instructions (Addendum)
Flu shot today  Labs today  Bring [place] COVID booster date.  2nd shigrix vaccine today  You are doing well today  Return as needed or in 1 year for next physical.   Health Maintenance, Male Adopting a healthy lifestyle and getting preventive care are important in promoting health and wellness. Ask your health care provider about: The right schedule for you to have regular tests and exams. Things you can do on your own to prevent diseases and keep yourself healthy. What should I know about diet, weight, and exercise? Eat a healthy diet  Eat a diet that includes plenty of vegetables, fruits, low-fat dairy products, and lean protein. Do not eat a lot of foods that are high in solid fats, added sugars, or sodium. Maintain a healthy weight Body mass index (BMI) is a measurement that can be used to identify possible weight problems. It estimates body fat based on height and weight. Your health care provider can help determine your BMI and help you achieve or maintain a healthy weight. Get regular exercise Get regular exercise. This is one of the most important things you can do for your health. Most adults should: Exercise for at least 150 minutes each week. The exercise should increase your heart rate and make you sweat (moderate-intensity exercise). Do strengthening exercises at least twice a week. This is in addition to the moderate-intensity exercise. Spend less time sitting. Even light physical activity can be beneficial. Watch cholesterol and blood lipids Have your blood tested for lipids and cholesterol at 63 years of age, then have this test every 5 years. You may need to have your cholesterol levels checked more often if: Your lipid or cholesterol levels are high. You are older than 63 years of age. You are at high risk for heart disease. What should I know about cancer screening? Many types of cancers can be detected early and may often be prevented. Depending on your health history  and family history, you may need to have cancer screening at various ages. This may include screening for: Colorectal cancer. Prostate cancer. Skin cancer. Lung cancer. What should I know about heart disease, diabetes, and high blood pressure? Blood pressure and heart disease High blood pressure causes heart disease and increases the risk of stroke. This is more likely to develop in people who have high blood pressure readings, are of African descent, or are overweight. Talk with your health care provider about your target blood pressure readings. Have your blood pressure checked: Every 3-5 years if you are 81-17 years of age. Every year if you are 58 years old or older. If you are between the ages of 57 and 34 and are a current or former smoker, ask your health care provider if you should have a one-time screening for abdominal aortic aneurysm (AAA). Diabetes Have regular diabetes screenings. This checks your fasting blood sugar level. Have the screening done: Once every three years after age 39 if you are at a normal weight and have a low risk for diabetes. More often and at a younger age if you are overweight or have a high risk for diabetes. What should I know about preventing infection? Hepatitis B If you have a higher risk for hepatitis B, you should be screened for this virus. Talk with your health care provider to find out if you are at risk for hepatitis B infection. Hepatitis C Blood testing is recommended for: Everyone born from [birth year] through 1963. Anyone with known risk factors for hepatitis  C. Sexually transmitted infections (STIs) You should be screened each year for STIs, including gonorrhea and chlamydia, if: You are sexually active and are younger than 63 years of age. You are older than 63 years of age and your health care provider tells you that you are at risk for this type of infection. Your sexual activity has changed since you were last screened, and you are at  increased risk for chlamydia or gonorrhea. Ask your health care provider if you are at risk. Ask your health care provider about whether you are at high risk for HIV. Your health care provider may recommend a prescription medicine to help prevent HIV infection. If you choose to take medicine to prevent HIV, you should first get tested for HIV. You should then be tested every 3 months for as long as you are taking the medicine. Follow these instructions at home: Lifestyle Do not use any products that contain nicotine or tobacco, such as cigarettes, e-cigarettes, and chewing tobacco. If you need help quitting, ask your health care provider. Do not use street drugs. Do not share needles. Ask your health care provider for help if you need support or information about quitting drugs. Alcohol use Do not drink alcohol if your health care provider tells you not to drink. If you drink alcohol: Limit how much you have to 0-2 drinks a day. Be aware of how much alcohol is in your drink. In the [place] one drink equals one 12 oz bottle of beer (355 mL), one 5 oz glass of wine (148 mL), or one 1 oz glass of hard liquor (44 mL). General instructions Schedule regular health, dental, and eye exams. Stay current with your vaccines. Tell your health care provider if: You often feel depressed. You have ever been abused or do not feel safe at home. Summary Adopting a healthy lifestyle and getting preventive care are important in promoting health and wellness. Follow your health care provider's instructions about healthy diet, exercising, and getting tested or screened for diseases. Follow your health care provider's instructions on monitoring your cholesterol and blood pressure. This information is not intended to replace advice given to you by your health care provider. Make sure you discuss any questions you have with your health care provider. Document Revised: 03/25/2020 Document Reviewed: 01/08/2018 Elsevier  Patient Education  2022 [employer].

## 2020-11-07 NOTE — Progress Notes (Signed)
[name], male    DOB: 03-Jun-1955, 65 y.o.   MRN: [id number]  This visit was conducted in person.  BP 140/68   Pulse 84   Temp 98 F (36.7 C) (Temporal)   Ht 5' 11.5" (1.816 m)   Wt 229 lb 3 oz (104 kg)   SpO2 97%   BMI 31.52 kg/m   160/90 on retesting - after blood draw  CC: CPE Subjective:   HPI: Andre [name] is a 65 y.o. male presenting on 11/07/2020 for Annual Exam   Doesn't check BP at home.  Gets CDL renewed every 2 yrs   Preventative: COLONOSCOPY Date: 01/2012 tubular adenoma Ardis Hughs), rpt 5 yrs COLONOSCOPY 03/2017 - hyperplastic polyp, int hem, rpt 5 yrs Ardis Hughs) Prostate cancer screening - requests continued yearly. Recent PSA reassuring.  Lung cancer screening - not eligible  Flu shot - yearly Td 2007, Tdap 2017 [place] 07/2019 x2, booster x1 Shingrix - 10/2019, 2nd today.  Advanced directive: not set up. Packet provided today. Would want wife Andre [name] to be HCPOA.  Seat belt use discussed.  Sunscreen use discussed. No changing moles on skin.  Non smoker. Wife smokes outside.  Alcohol - occasional.  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 Interim medical history since our last visit reviewed. Allergies and medications reviewed and updated. Outpatient Medications Prior to Visit  Medication Sig Dispense Refill   Multiple Vitamins-Minerals (MULTIVITAMIN & MINERAL PO) Take 1 tablet by mouth daily.     benazepril (LOTENSIN) 20 MG tablet Take 1 tablet by mouth once daily 90 tablet 0   naproxen (NAPROSYN) 375 MG tablet Take 1 tablet (375 mg total) by mouth 2 (two) times daily with a  meal. 40 tablet 0   No facility-administered medications prior to visit.     Per HPI unless specifically indicated in ROS section below Review of Systems  Constitutional:  Negative for activity change, appetite change, chills, fatigue, fever and unexpected weight change.  HENT:  Negative for hearing loss.   Eyes:  Negative for visual disturbance.  Respiratory:  Negative for cough, chest tightness, shortness of breath and wheezing.   Cardiovascular:  Negative for chest pain, palpitations and leg swelling.  Gastrointestinal:  Negative for abdominal distention, abdominal pain, blood in stool, constipation, diarrhea, nausea and vomiting.  Genitourinary:  Negative for difficulty urinating and hematuria.  Musculoskeletal:  Negative for arthralgias, myalgias and neck pain.  Skin:  Negative for rash.  Neurological:  Negative for dizziness, seizures, syncope and headaches.  Hematological:  Negative for adenopathy. Does not bruise/bleed easily.  Psychiatric/Behavioral:  Negative for dysphoric mood. The patient is not nervous/anxious.    Objective:  BP 140/68   Pulse 84   Temp 98 F (36.7 C) (Temporal)   Ht 5' 11.5" (1.816 m)   Wt 229 lb 3 oz (104 kg)   SpO2 97%   BMI 31.52 kg/m   Wt Readings from Last 3 Encounters:  11/07/20 229 lb 3 oz (104 kg)  01/08/20 225 lb 7 oz (102.3 kg)  12/30/19 223 lb (101.2 kg)      Physical Exam Vitals and  nursing note reviewed.  Constitutional:      General: He is not in acute distress.    Appearance: Normal appearance. He is well-developed. He is not ill-appearing.  HENT:     Head: Normocephalic and atraumatic.     Right Ear: Hearing, tympanic membrane, ear canal and external ear normal.     Left Ear: Hearing, tympanic membrane, ear canal and external ear normal.  Eyes:     General: No scleral icterus.    Extraocular Movements: Extraocular movements intact.     Conjunctiva/sclera: Conjunctivae normal.     Pupils: Pupils are equal, round, and  reactive to light.  Neck:     Thyroid: No thyroid mass or thyromegaly.  Cardiovascular:     Rate and Rhythm: Normal rate and regular rhythm.     Pulses: Normal pulses.          Radial pulses are 2+ on the right side and 2+ on the left side.     Heart sounds: Normal heart sounds. No murmur heard. Pulmonary:     Effort: Pulmonary effort is normal. No respiratory distress.     Breath sounds: Normal breath sounds. No wheezing, rhonchi or rales.  Abdominal:     General: Bowel sounds are normal. There is no distension.     Palpations: Abdomen is soft. There is no mass.     Tenderness: There is no abdominal tenderness. There is no guarding or rebound.     Hernia: No hernia is present.  Musculoskeletal:        General: Normal range of motion.     Cervical back: Normal range of motion and neck supple.     Right lower leg: No edema.     Left lower leg: No edema.  Lymphadenopathy:     Cervical: No cervical adenopathy.  Skin:    General: Skin is warm and dry.     Findings: No rash.  Neurological:     General: No focal deficit present.     Mental Status: He is alert and oriented to person, place, and time.  Psychiatric:        Mood and Affect: Mood normal.        Behavior: Behavior normal.        Thought Content: Thought content normal.        Judgment: Judgment normal.      Results for orders placed or performed in visit on 10/28/19  PSA  Result Value Ref Range   PSA 1.40 0.10 - 4.00 ng/mL  Hemoglobin A1c  Result Value Ref Range   Hgb A1c MFr Bld 5.8 4.6 - 6.5 %  Comprehensive metabolic panel  Result Value Ref Range   Sodium 138 135 - 145 mEq/L   Potassium 4.2 3.5 - 5.1 mEq/L   Chloride 102 96 - 112 mEq/L   CO2 29 19 - 32 mEq/L   Glucose, Bld 123 (H) 70 - 99 mg/dL   BUN 14 6 - 23 mg/dL   Creatinine, Ser 0.81 0.40 - 1.50 mg/dL   Total Bilirubin 0.7 0.2 - 1.2 mg/dL   Alkaline Phosphatase 70 39 - 117 U/L   AST 15 0 - 37 U/L   ALT 18 0 - 53 U/L   Total Protein 6.8 6.0 - 8.3  g/dL   Albumin 4.3 3.5 - 5.2 g/dL   GFR 96.55 >60.00 mL/min   Calcium 9.0 8.4 - 10.5 mg/dL  Lipid panel  Result Value Ref Range   Cholesterol 166 0 - 200  mg/dL   Triglycerides 146.0 0.0 - 149.0 mg/dL   HDL 48.60 >39.00 mg/dL   VLDL 29.2 0.0 - 40.0 mg/dL   LDL Cholesterol 88 0 - 99 mg/dL   Total CHOL/HDL Ratio 3    NonHDL 117.67     Assessment & Plan:  This visit occurred during the SARS-CoV-2 public health emergency.  Safety protocols were in place, including screening questions prior to the visit, additional usage of staff PPE, and extensive cleaning of exam room while observing appropriate contact time as indicated for disinfecting solutions.   Problem List Items Addressed This Visit     Health maintenance examination - Primary (Chronic)    Preventative protocols reviewed and updated unless pt declined. Discussed healthy diet and lifestyle.       HYPERCHOLESTEROLEMIA    Chronic, off statin. Update FLP today . The 10-year ASCVD risk score (Arnett DK, et al., 2019) is: 13.1%   Values used to calculate the score:     Age: 89 years     Sex: Male     Is Non-Hispanic African American: No     Diabetic: No     Tobacco smoker: No     Systolic Blood Pressure: 892 mmHg     Is BP treated: Yes     HDL Cholesterol: 48.6 mg/dL     Total Cholesterol: 166 mg/dL       Relevant Medications   benazepril (LOTENSIN) 20 MG tablet   Other Relevant Orders   Lipid panel   Comprehensive metabolic panel   Essential hypertension, benign    Chronic, adequate on initial BP check however elevated reading when I recheck. This in setting of recent blood draw. I did ask him to start monitoring BP more closely, reviewed correct method of collecting blood pressure readings at home, advised let me know if blood pressures staying consistently elevated at home for medication titration. He agrees with plan.       Relevant Medications   benazepril (LOTENSIN) 20 MG tablet   Prediabetes    Update A1c  today.       Relevant Orders   Hemoglobin A1c   Obesity, Class I, BMI 30.0-34.9 (see actual BMI)    Encouraged healthy diet and lifestyle choices to affect sustainable weight loss.       Other Visit Diagnoses     Special screening for malignant neoplasm of prostate       Relevant Orders   PSA   Need for influenza vaccination       Relevant Orders   Flu Vaccine QUAD 36mo+IM (Fluarix, Fluzone & Alfiuria Quad PF) (Completed)   Need for shingles vaccine       Relevant Orders   Varicella-zoster vaccine IM (Completed)        Meds ordered this encounter  Medications   benazepril (LOTENSIN) 20 MG tablet    Sig: Take 1 tablet (20 mg total) by mouth daily.    Dispense:  90 tablet    Refill:  3    Orders Placed This Encounter  Procedures   Flu Vaccine QUAD 22mo+IM (Fluarix, Fluzone & Alfiuria Quad PF)   Varicella-zoster vaccine IM   Lipid panel   Comprehensive metabolic panel   Hemoglobin A1c   PSA     Patient instructions: Flu shot today  Labs today  Bring [place] COVID booster date.  2nd shigrix vaccine today  You are doing well today  Return as needed or in 1 year for next physical.   Follow up plan:  Return in about 1 year (around 11/07/2021) for annual exam, prior fasting for blood work.  Ria Bush, MD

## 2020-11-07 NOTE — Assessment & Plan Note (Signed)
Update A1c today 

## 2020-11-07 NOTE — Assessment & Plan Note (Addendum)
Chronic, adequate on initial BP check however elevated reading when I recheck. This in setting of recent blood draw. I did ask him to start monitoring BP more closely, reviewed correct method of collecting blood pressure readings at home, advised let me know if blood pressures staying consistently elevated at home for medication titration. He agrees with plan.

## 2020-11-08 LAB — LIPID PANEL
Cholesterol: 197 mg/dL (ref 0–200)
HDL: 52.7 mg/dL (ref >39.00)
NonHDL: 144.38
Total CHOL/HDL Ratio: 4
Triglycerides: 254 mg/dL — ABNORMAL HIGH (ref 0.0–149.0)
VLDL: 50.8 mg/dL — ABNORMAL HIGH (ref 0.0–40.0)

## 2020-11-08 LAB — COMPREHENSIVE METABOLIC PANEL
ALT: 18 U/L (ref 0–53)
AST: 16 U/L (ref 0–37)
Albumin: 4.6 g/dL (ref 3.5–5.2)
Alkaline Phosphatase: 73 U/L (ref 39–117)
BUN: 11 mg/dL (ref 6–23)
CO2: 30 mEq/L (ref 19–32)
Calcium: 9.7 mg/dL (ref 8.4–10.5)
Chloride: 102 mEq/L (ref 96–112)
Creatinine, Ser: 0.76 mg/dL (ref 0.40–1.50)
GFR: 95.95 mL/min (ref >60.00)
Glucose, Bld: 76 mg/dL (ref 70–99)
Potassium: 4.3 mEq/L (ref 3.5–5.1)
Sodium: 139 mEq/L (ref 135–145)
Total Bilirubin: 0.4 mg/dL (ref 0.2–1.2)
Total Protein: 7.4 g/dL (ref 6.0–8.3)

## 2020-11-08 LAB — LDL CHOLESTEROL, DIRECT: Direct LDL: 116 mg/dL

## 2020-11-08 LAB — PSA: PSA: 1.49 ng/mL (ref 0.10–4.00)

## 2020-11-08 LAB — HEMOGLOBIN A1C: Hgb A1c MFr Bld: 5.7 % (ref 4.6–6.5)

## 2020-11-11 ENCOUNTER — Telehealth: Payer: Self-pay | Admitting: Family Medicine

## 2020-11-11 NOTE — Telephone Encounter (Signed)
According to pt's chart, rx was sent to [facility] on 11/07/20 with 3 additional refills.   Lvm asking pt to call back.  Need to relay message above.

## 2020-11-11 NOTE — Telephone Encounter (Signed)
  Encourage patient to contact the pharmacy for refills or they can request refills through [facility]:  Please schedule appointment if longer than 1 year  NEXT APPOINTMENT DATE:  MEDICATION:benazepril  Is the patient out of medication? yes  PHARMACY: [facility] on [address]  Let patient know to contact pharmacy at the end of the day to make sure medication is ready.  Please notify patient to allow 48-72 hours to process  CLINICAL FILLS OUT ALL BELOW:   LAST REFILL:  QTY:  REFILL DATE:    OTHER COMMENTS:    Okay for refill?  Please advise

## 2021-07-30 ENCOUNTER — Emergency Department (HOSPITAL_BASED_OUTPATIENT_CLINIC_OR_DEPARTMENT_OTHER): Payer: Commercial Managed Care - PPO

## 2021-07-30 ENCOUNTER — Emergency Department (HOSPITAL_BASED_OUTPATIENT_CLINIC_OR_DEPARTMENT_OTHER)
Admission: EM | Admit: 2021-07-30 | Discharge: 2021-07-30 | Disposition: A | Discharge status: Home / Self Care | Payer: Commercial Managed Care - PPO | Attending: Emergency Medicine | Admitting: Emergency Medicine

## 2021-07-30 ENCOUNTER — Encounter (HOSPITAL_BASED_OUTPATIENT_CLINIC_OR_DEPARTMENT_OTHER): Payer: Self-pay | Admitting: Emergency Medicine

## 2021-07-30 ENCOUNTER — Other Ambulatory Visit: Payer: Self-pay

## 2021-07-30 DIAGNOSIS — U071 COVID-19: Secondary | ICD-10-CM | POA: Insufficient documentation

## 2021-07-30 DIAGNOSIS — R509 Fever, unspecified: Secondary | ICD-10-CM | POA: Diagnosis present

## 2021-07-30 DIAGNOSIS — R000 Tachycardia, unspecified: Secondary | ICD-10-CM | POA: Diagnosis not present

## 2021-07-30 LAB — BASIC METABOLIC PANEL
Anion gap: 10 (ref 5–15)
BUN: 11 mg/dL (ref 8–23)
CO2: 23 mmol/L (ref 22–32)
Calcium: 9.1 mg/dL (ref 8.9–10.3)
Chloride: 103 mmol/L (ref 98–111)
Creatinine, Ser: 0.86 mg/dL (ref 0.61–1.24)
GFR, Estimated: >60 mL/min (ref >60)
Glucose, Bld: 124 mg/dL — ABNORMAL HIGH (ref 70–99)
Potassium: 3.7 mmol/L (ref 3.5–5.1)
Sodium: 136 mmol/L (ref 135–145)

## 2021-07-30 LAB — RAPID INFLUENZA A&B ANTIGENS
Influenza A (ARMC): NEGATIVE
Influenza B (ARMC): NEGATIVE

## 2021-07-30 LAB — SARS CORONAVIRUS 2 BY RT PCR: SARS Coronavirus 2 by RT PCR: POSITIVE — AB

## 2021-07-30 LAB — GROUP A STREP BY PCR: Group A Strep by PCR: NOT DETECTED

## 2021-07-30 MED ORDER — ACETAMINOPHEN 325 MG PO TABS
650.0000 mg | ORAL_TABLET | Freq: Once | ORAL | Status: AC
  Administered 2021-07-30: 650 mg via ORAL
  Filled 2021-07-30: qty 2

## 2021-07-30 MED ORDER — NIRMATRELVIR/RITONAVIR (PAXLOVID)TABLET: 3.0000 | ORAL_TABLET | Freq: Two times a day (BID) | ORAL | 0 refills | Status: AC

## 2021-07-30 NOTE — ED Triage Notes (Signed)
Pt arrives pov, steady gait, c/o cough, fever, sore throat and chills since yesterday morning. Pt denies CP

## 2021-07-30 NOTE — ED Provider Notes (Signed)
[facility] EMERGENCY DEPARTMENT Provider Note   CSN: [id number] Arrival date & time: 07/30/21  3557     History  Chief Complaint  Patient presents with   Cough   Fever    Andre [name] is a 66 y.o. male.  Patient is a 66 year old male who presents with cough and fever since yesterday.  He has had a scratchy throat, runny nose and congestion with some myalgias.  No shortness of breath.  No associated chest pain.  No nausea vomiting or diarrhea.  No abdominal pain.       Home Medications Prior to Admission medications   Medication Sig Start Date End Date Taking? Authorizing Provider  nirmatrelvir/ritonavir EUA (PAXLOVID) 20 x 150 MG & 10 x '100MG'$  TABS Take 3 tablets by mouth 2 (two) times daily for 5 days. Patient GFR is 60. Take nirmatrelvir (150 mg) two tablets twice daily for 5 days and ritonavir (100 mg) one tablet twice daily for 5 days. 07/30/21 08/04/21 Yes Malvin Johns, MD  benazepril (LOTENSIN) 20 MG tablet Take 1 tablet (20 mg total) by mouth daily. 11/07/20   Ria Bush, MD  Multiple Vitamins-Minerals (MULTIVITAMIN & MINERAL PO) Take 1 tablet by mouth daily.    [provider]      Allergies    Dust mite extract    Review of Systems   Review of Systems  Constitutional:  Positive for fatigue and fever. Negative for chills and diaphoresis.  HENT:  Positive for congestion, rhinorrhea and sore throat. Negative for sneezing.   Eyes: Negative.   Respiratory:  Positive for cough. Negative for chest tightness and shortness of breath.   Cardiovascular:  Negative for chest pain and leg swelling.  Gastrointestinal:  Negative for abdominal pain, blood in stool, diarrhea, nausea and vomiting.  Genitourinary:  Negative for difficulty urinating, flank pain, frequency and hematuria.  Musculoskeletal:  Positive for myalgias. Negative for arthralgias and back pain.  Skin:  Negative for rash.  Neurological:  Negative for dizziness, speech difficulty,  weakness, numbness and headaches.    Physical Exam Updated Vital Signs BP 129/79   Pulse 95   Temp 100.1 F (37.8 C) (Oral)   Resp (!) 21   Ht 6' (1.829 m)   Wt 95.7 kg   SpO2 97%   BMI 28.62 kg/m  Physical Exam Constitutional:      Appearance: He is well-developed.  HENT:     Head: Normocephalic and atraumatic.     Right Ear: Tympanic membrane normal.     Left Ear: Tympanic membrane normal.     Mouth/Throat:     Mouth: Mucous membranes are moist.     Pharynx: No oropharyngeal exudate or posterior oropharyngeal erythema.  Eyes:     Conjunctiva/sclera: Conjunctivae normal.     Pupils: Pupils are equal, round, and reactive to light.  Neck:     Comments: No meningismus Cardiovascular:     Rate and Rhythm: Regular rhythm. Tachycardia present.     Heart sounds: Normal heart sounds.  Pulmonary:     Effort: Pulmonary effort is normal. No respiratory distress.     Breath sounds: Rhonchi present. No wheezing or rales.  Chest:     Chest wall: No tenderness.  Abdominal:     General: Bowel sounds are normal.     Palpations: Abdomen is soft.     Tenderness: There is no abdominal tenderness. There is no guarding or rebound.  Musculoskeletal:        General: Normal range of  motion.     Cervical back: Normal range of motion and neck supple.  Lymphadenopathy:     Cervical: No cervical adenopathy.  Skin:    General: Skin is warm and dry.     Findings: No rash.  Neurological:     Mental Status: He is alert and oriented to person, place, and time.     ED Results / Procedures / Treatments   Labs (all labs ordered are listed, but only abnormal results are displayed) Labs Reviewed  SARS CORONAVIRUS 2 BY RT PCR - Abnormal; Notable for the following components:      Result Value   SARS Coronavirus 2 by RT PCR POSITIVE (*)    All other components within normal limits  BASIC METABOLIC PANEL - Abnormal; Notable for the following components:   Glucose, Bld 124 (*)    All other  components within normal limits  GROUP A STREP BY PCR  RAPID INFLUENZA A&B ANTIGENS    EKG EKG Interpretation  Date/Time:  'Sunday July 30 2021 10:10:46 EDT Ventricular Rate:  118 PR Interval:  148 QRS Duration: 80 QT Interval:  310 QTC Calculation: 434 R Axis:   46 Text Interpretation: Sinus tachycardia Otherwise normal ECG No previous ECGs available Confirmed by Freda Jaquith ([id number]) on 07/30/2021 10:44:58 AM  Radiology DG Chest 2 View  Result Date: 07/30/2021 CLINICAL DATA:  Cough and fever.  Unsteady gait. EXAM: CHEST - 2 VIEW COMPARISON:  Radiographs 02/18/2017. FINDINGS: The heart size and mediastinal contours are normal. The lungs are clear. There is no pleural effusion or pneumothorax. No acute osseous findings are identified. Telemetry leads overlie the chest IMPRESSION: Stable chest.  No active cardiopulmonary process. Electronically Signed   By: William  Veazey M.D.   On: 07/30/2021 11:04    Procedures Procedures    Medications Ordered in ED Medications  acetaminophen (TYLENOL) tablet 650 mg (650 mg Oral Given 07/30/21 1011)    ED Course/ Medical Decision Making/ A&P                           Medical Decision Making Amount and/or Complexity of Data Reviewed External Data Reviewed: labs. Labs: ordered. Decision-making details documented in ED Course. Radiology: ordered and independent interpretation performed. Decision-making details documented in ED Course. ECG/medicine tests: ordered and independent interpretation performed. Decision-making details documented in ED Course.  Risk OTC drugs. Prescription drug management. Decision regarding hospitalization.   Patient is a 65 year old male who presents with cough and congestion.  He has had some low-grade fevers as well.  He has no hypoxia.  No shortness of breath.  His COVID test is positive.  Chest x-ray was performed which shows no evidence of pneumonia.  This was interpreted by me.  He did want to be placed on  Paxlovid.  On chart view, I do not see any recent labs performed.  BMP was ordered and shows a normal creatinine.  I gave him a prescription for Paxlovid.  Symptomatic care instructions and return precautions were given.  At this point he does not have indication for inpatient hospitalization.    Final Clinical Impression(s) / ED Diagnoses Final diagnoses:  COVID-19 virus infection    Rx / DC Orders ED Discharge Orders          Ordered    nirmatrelvir/ritonavir EUA (PAXLOVID) 20 x 150 MG & 10 x 100MG TABS  2 times daily        07'$ /02/23 1423  Malvin Johns, MD 07/30/21 1453

## 2021-09-05 ENCOUNTER — Encounter: Payer: Self-pay | Admitting: Family Medicine

## 2021-09-05 ENCOUNTER — Ambulatory Visit (INDEPENDENT_AMBULATORY_CARE_PROVIDER_SITE_OTHER): Payer: Commercial Managed Care - PPO | Admitting: Family Medicine

## 2021-09-05 ENCOUNTER — Ambulatory Visit (INDEPENDENT_AMBULATORY_CARE_PROVIDER_SITE_OTHER)
Admission: RE | Admit: 2021-09-05 | Discharge: 2021-09-05 | Disposition: A | Discharge status: Home / Self Care | Payer: Commercial Managed Care - PPO | Source: Ambulatory Visit | Attending: Family Medicine | Admitting: Family Medicine

## 2021-09-05 VITALS — BP 132/70 | HR 81 | Temp 98.0°F | Ht 72.0 in | Wt 214.1 lb

## 2021-09-05 DIAGNOSIS — M160 Bilateral primary osteoarthritis of hip: Secondary | ICD-10-CM | POA: Diagnosis not present

## 2021-09-05 DIAGNOSIS — M25551 Pain in right hip: Secondary | ICD-10-CM

## 2021-09-05 DIAGNOSIS — G8929 Other chronic pain: Secondary | ICD-10-CM | POA: Insufficient documentation

## 2021-09-05 DIAGNOSIS — M1611 Unilateral primary osteoarthritis, right hip: Secondary | ICD-10-CM

## 2021-09-05 DIAGNOSIS — M25552 Pain in left hip: Secondary | ICD-10-CM

## 2021-09-05 NOTE — Progress Notes (Signed)
Patient ID: Nobuo Nunziata, male    DOB: 1955/12/11, 66 y.o.   MRN: [id number]  This visit was conducted in person.  BP 132/70   Pulse 81   Temp 98 F (36.7 C) (Temporal)   Ht 6' (1.829 m)   Wt 214 lb 2 oz (97.1 kg)   SpO2 97%   BMI 29.04 kg/m    CC: R>L hip pain  Subjective:   HPI: Farrel Guimond is a 66 y.o. male presenting on 09/05/2021 for Hip Pain (C/o bilateral hip soreness- worse in R, stiffness and weakness. Started about 1 yr ago, worsening. Pt accompanied by wife, Jolet. )   1+ yr h/o bilateral hip soreness, R>L, associated with stiffness and weakness. Worse towards end of the work week. Has to use arms to get out of chair, has to support weight with arms to bend over. Worse pain going up stairs. This pain is activity limiting. Denies inciting trauma/injury or falls. No frank pain. Not using medication for this other than turmeric powder.   Severe bilateral hip osteoarthritis by xray 08/2019 - at that time sent to PT x a few sessions, sent home with HEP which he does intermittently.   He is hesitant for surgery at this time, would want to wait 1-2 yrs when he retires.   COVID infection 07/2021 - treated with paxlovid.  New job driving a bus.  Taking turmeric and ginger as well as elderberry and emergenC.      Relevant past medical, surgical, family and social history reviewed and updated as indicated. Interim medical history since our last visit reviewed. Allergies and medications reviewed and updated. Outpatient Medications Prior to Visit  Medication Sig Dispense Refill   benazepril (LOTENSIN) 20 MG tablet Take 1 tablet (20 mg total) by mouth daily. 90 tablet 3   Multiple Vitamins-Minerals (MULTIVITAMIN & MINERAL PO) Take 1 tablet by mouth daily.     No facility-administered medications prior to visit.     Per HPI unless specifically indicated in ROS section below Review of Systems  Objective:  BP 132/70   Pulse 81   Temp 98 F (36.7 C) (Temporal)   Ht 6'  (1.829 m)   Wt 214 lb 2 oz (97.1 kg)   SpO2 97%   BMI 29.04 kg/m   Wt Readings from Last 3 Encounters:  09/05/21 214 lb 2 oz (97.1 kg)  07/30/21 211 lb (95.7 kg)  11/07/20 229 lb 3 oz (104 kg)      Physical Exam Vitals and nursing note reviewed.  Constitutional:      Appearance: Normal appearance. He is not ill-appearing.  Cardiovascular:     Rate and Rhythm: Normal rate and regular rhythm.     Pulses: Normal pulses.     Heart sounds: Normal heart sounds. No murmur heard. Pulmonary:     Effort: Pulmonary effort is normal. No respiratory distress.     Breath sounds: Normal breath sounds. No wheezing, rhonchi or rales.  Musculoskeletal:     Right hip: Decreased range of motion.     Left hip: Decreased range of motion.     Right lower leg: No edema.     Left lower leg: No edema.     Comments: Marked decreased ROM to hip flexion and hip int/ext rotation, reproducible pain to R groin with R SLR.   Skin:    General: Skin is warm and dry.     Findings: No rash.  Neurological:     Mental Status: He  is alert.        DG HIP (WITH OR WITHOUT PELVIS) 2-3V RIGHT   COMPARISON:  None.   FINDINGS: No fracture or dislocation. No aggressive osseous lesion. Severe osteoarthritis of bilateral hips. Small marginal osteophytes of bilateral hips.   IMPRESSION: Severe osteoarthritis of bilateral hips.   Electronically Signed   By: Kathreen Devoid   On: 09/02/2019 15:55  Assessment & Plan:   Problem List Items Addressed This Visit     Bilateral primary osteoarthritis of hip - Primary    Exam suggests progressive hip osteoarthritis causing current symptoms, with marked decreased range of motion. Will update hip films today. He is hesitant for surgery at this time, but agrees to ortho referral for further evaluation/management. Discussed tylenol use.       Relevant Orders   DG HIPS BILAT W OR W/O PELVIS 3-4 VIEWS   Chronic hip pain, bilateral   Relevant Orders   DG HIPS BILAT W OR  W/O PELVIS 3-4 VIEWS     No orders of the defined types were placed in this encounter.  Orders Placed This Encounter  Procedures   DG HIPS BILAT W OR W/O PELVIS 3-4 VIEWS    Standing Status:   Future    Number of Occurrences:   1    Standing Expiration Date:   09/06/2022    Order Specific Question:   Reason for Exam (SYMPTOM  OR DIAGNOSIS REQUIRED)    Answer:   bilateral hip pain R>L, progressive    Order Specific Question:   Preferred imaging location?    Answer:   [facility]     Patient Instructions  I think symptoms are all coming from progressive hip arthritis  Hip xrays today.  We will refer you to orthopedist for evaluation.  If pain, start with tylenol '500mg'$  1-2 a day. Let me know if stronger pain medicine needed.   Follow up plan: Return if symptoms worsen or fail to improve.  Ria Bush, MD

## 2021-09-05 NOTE — Patient Instructions (Addendum)
I think symptoms are all coming from progressive hip arthritis  Hip xrays today.  We will refer you to orthopedist for evaluation.  If pain, start with tylenol '500mg'$  1-2 a day. Let me know if stronger pain medicine needed.

## 2021-09-05 NOTE — Assessment & Plan Note (Signed)
Exam suggests progressive hip osteoarthritis causing current symptoms, with marked decreased range of motion. Will update hip films today. He is hesitant for surgery at this time, but agrees to ortho referral for further evaluation/management. Discussed tylenol use.

## 2021-09-05 NOTE — Assessment & Plan Note (Deleted)
Exam suggests progressive hip osteoarthritis causing current symptoms, with marked decreased range of motion. Will update hip films today. He is hesitant for surgery at this time, but agrees to ortho referral for further evaluation/management. Discussed tylenol use.

## 2021-09-09 IMAGING — DX DG FOOT COMPLETE 3+V*L*
3 series · 3 of 3 positions shown · non-contrast
Comparison: None.

CLINICAL DATA: Left foot injury due to dropping a 30 pound weight
on the foot today. Initial encounter.

EXAM:
LEFT FOOT - COMPLETE 3+ VIEW

[foot ap]
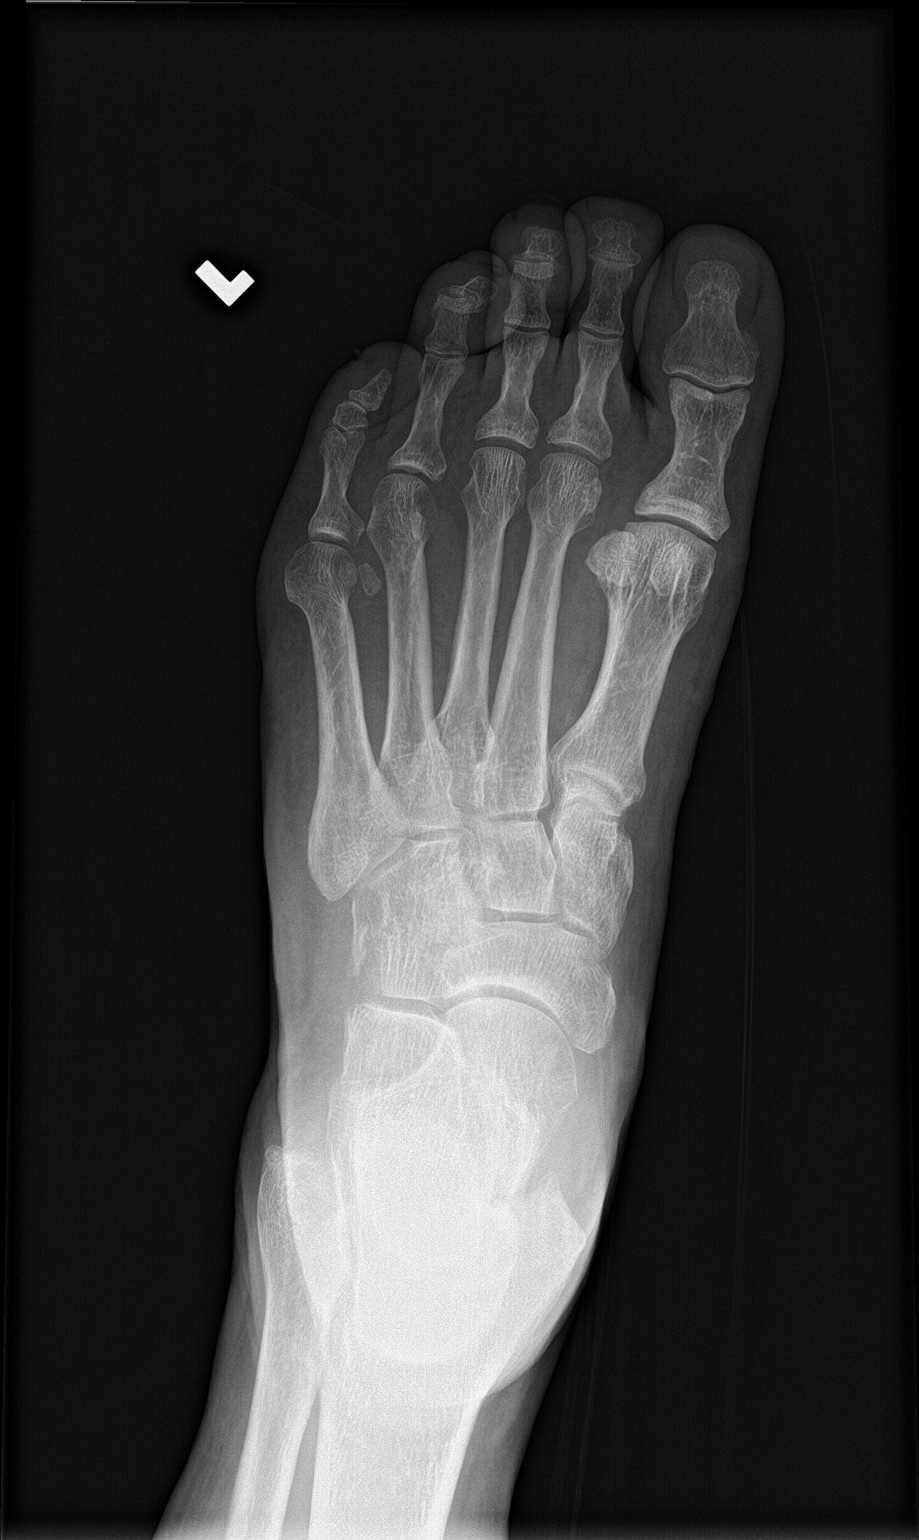

[foot obl]
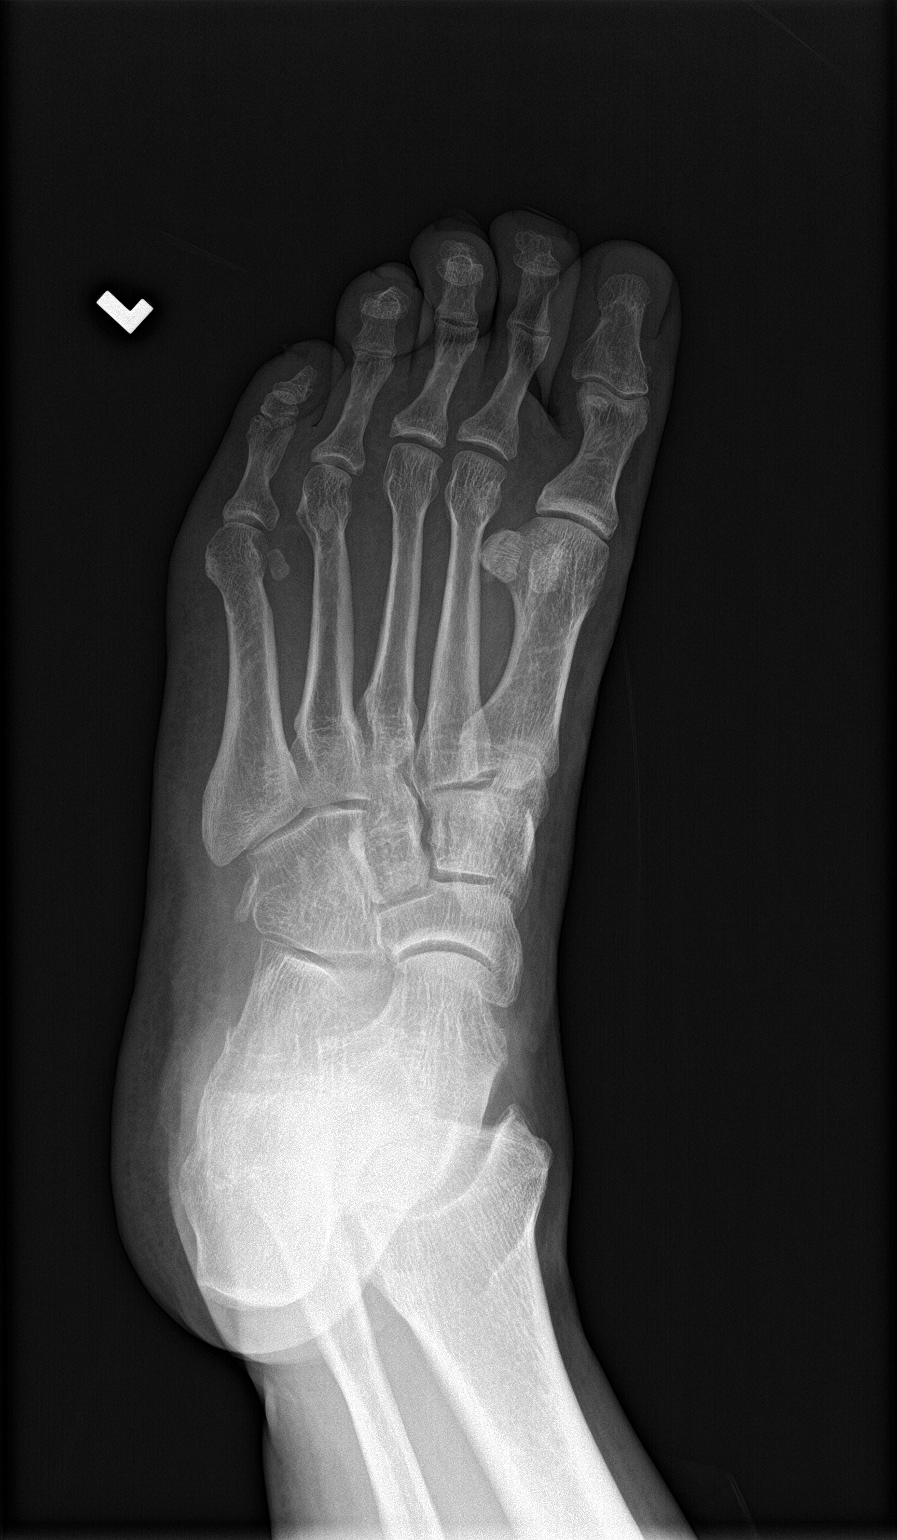

[foot lat]
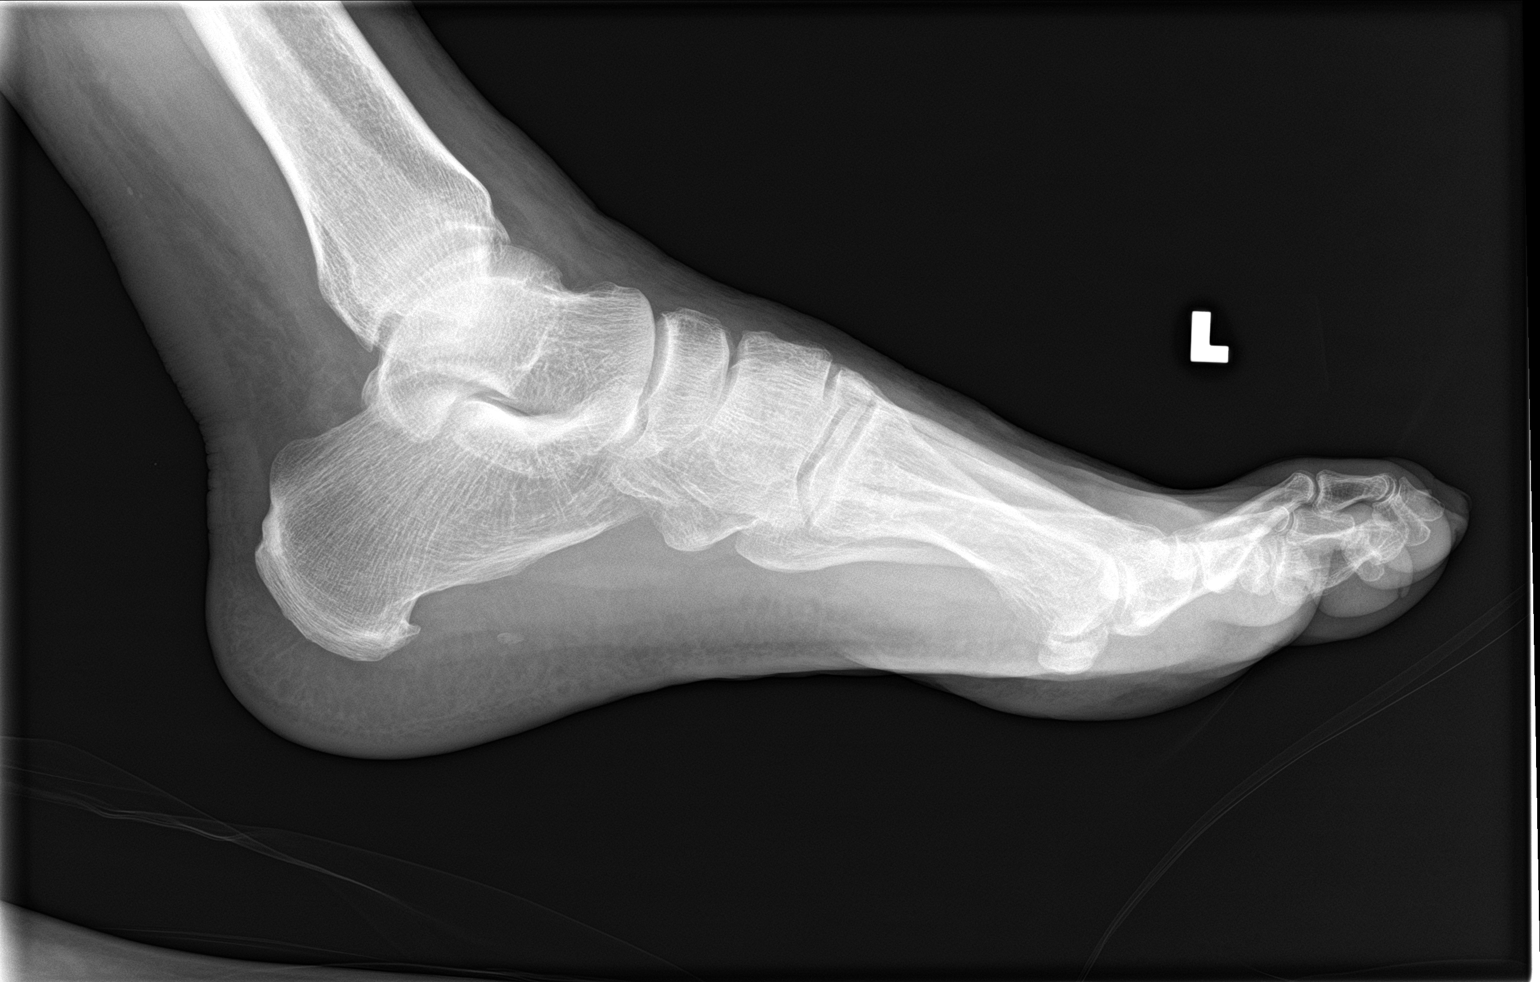

[3 of 3 positions shown; findings below may reference images not displayed]

FINDINGS: No acute bony or joint abnormality is identified. The patient has a
remote healed fracture of the proximal phalanx of the little toe.
Small plantar calcaneal spur noted. Soft tissues are unremarkable.
IMPRESSION: No acute abnormality.

Remote healed fracture proximal phalanx of the little toe.

## 2021-10-18 ENCOUNTER — Ambulatory Visit: Payer: Commercial Managed Care - PPO | Admitting: Sports Medicine

## 2021-10-18 ENCOUNTER — Encounter: Payer: Self-pay | Admitting: Sports Medicine

## 2021-10-18 VITALS — Ht 72.0 in | Wt 225.0 lb

## 2021-10-18 DIAGNOSIS — M25551 Pain in right hip: Secondary | ICD-10-CM

## 2021-10-18 DIAGNOSIS — M160 Bilateral primary osteoarthritis of hip: Secondary | ICD-10-CM | POA: Diagnosis not present

## 2021-10-18 MED ORDER — CELECOXIB 200 MG PO CAPS: 200.0000 mg | ORAL_CAPSULE | Freq: Every day | ORAL | 1 refills | Status: DC

## 2021-10-18 NOTE — Progress Notes (Signed)
Andre [name] - 66 y.o. male MRN [id number]  Date of birth: [birth year]  Office Visit Note: Visit Date: 10/18/2021 PCP: Ria Bush, MD Referred by: Ria Bush, MD  Subjective: Chief Complaint  Patient presents with   Right Hip - Pain   HPI: Andre [name] is a very pleasant 66 y.o. male who presents today for bilateral hip pain, right greater than left.  Andre [name] has had bilateral hip pain for about a year or more.  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  Reports pain over the anterior inner aspect of both hips, right greater than left.  Does note limited range of motion.  His pain will be worse at the end of the day with activity.  He is taking extra strength Tylenol without much relief.  He has done some formalized therapy in the past, although nothing recently.  He is taking collagen and turmeric for supplements.  His PCP did give him some oral prednisone which helped to some extent in the past, although then this wore off.  His wife is present with him today, she states her mother had a hip replacement around age 90.  Pertinent ROS were reviewed with the patient and found to be negative unless otherwise specified above in HPI.   Assessment & Plan: Visit Diagnoses:  1. Bilateral primary osteoarthritis of hip   2. Pain in right hip    Plan: I had a lengthy discussion with Andre [name] and his wife today regarding his bilateral hip pain.  We did review his x-rays which show severe bone-on-bone arthritic change of the hips.  He has marked limited internal range of motion as well because of this.  The pain itself did not start really bothering his activities of daily living until rather recently, which is why he would like to proceed with conservative treatment first.  We discussed all options  such as oral therapy, rehab/physical therapy, as well as surgical management.  I did discuss a brief overview of what a total hip replacement would entail as well as a general overview of expected recovery and return to ambulation.  We will start with Celebrex 200 mg once daily and I did provide him a hip stabilization rehab protocol at home which my athletic trainer did go through with him today.  We will see how he does with this over the next 4-6 weeks.  If he does not have much relief, I may have him see my partner, Dr. Ninfa Linden, discussed surgical treatment options.  Also discussed the role for injection therapy for temporary relief.  Follow-up: Return for 4-6 weeks with Dr. Rolena Infante for hips.   Meds & Orders:  Meds ordered this encounter  Medications   celecoxib (CELEBREX) 200 MG capsule    Sig: Take 1 capsule (200 mg total) by mouth daily.    Dispense:  30 capsule    Refill:  1   No orders of the defined types were placed in this encounter.    Procedures: No procedures performed      Clinical History: No specialty comments available.  He reports that he has never smoked. He has never used smokeless tobacco.  Recent Labs    11/07/20 1616  HGBA1C 5.7    Objective:   Vital Signs: Ht 6' (1.829 m)   Wt 225 lb (  102.1 kg)   BMI 30.52 kg/m   Physical Exam  Gen: Well-appearing, in no acute distress; non-toxic CV:  Well-perfused. Warm.  Resp: Breathing unlabored on room air; no wheezing. Psych: Fluid speech in conversation; appropriate affect; normal thought process Neuro: Sensation intact throughout. No gross coordination deficits.   Ortho Exam  -Bilateral hips: No true bony TTP.  No overlying erythema or swelling.  There is significant restriction with passive logroll internally.  Right hip only about 5 degrees of internal rotation, left hip about 15 degrees, compared to rather preserved external range of motion.  Positive FADIR testing with bony block and pain.  Patient does  walk with a right-sided limited leg swing.  Difficulty going from a seated to standing position.   Imaging:  *3 view bilateral hip x-ray from 09/05/2021 was independently reviewed by myself.  X-rays demonstrate severe, bone-on-bone arthritic change of bilateral hip joints.  There is significant bony spurring and sclerotic change throughout each hip.  There is acetabular bony cyst formation, right greater than left.  Varus deformation.    Past Medical/Family/Surgical/Social History: Medications & Allergies reviewed per EMR, new medications updated. Patient Active Problem List   Diagnosis Date Noted   Chronic hip pain, bilateral 09/05/2021   Crushing injury of left foot 01/08/2020   Advanced directives, counseling/discussion 11/04/2019   Obesity, Class I, BMI 30.0-34.9 (see actual BMI) 11/04/2019   Bilateral primary osteoarthritis of hip 09/02/2019   Erectile dysfunction 01/11/2018   Epidermal inclusion cyst 12/13/2014   Gross hematuria 05/03/2014   Achilles tendon pain 12/31/2013   Rosacea 04/20/2010   Health maintenance examination 04/20/2010   Prediabetes 09/06/2008   Essential hypertension, benign 12/12/2006   HYPERCHOLESTEROLEMIA 11/25/2006   Past Medical History:  Diagnosis Date   COVID-19 virus infection 01/2019   Essential hypertension, benign    History of colon polyps 2014   Nephrolithiasis 06/2014   ER visit   Pure hypercholesterolemia    Family History  Problem Relation Age of Onset   Colon cancer Sister 15   Breast cancer Sister 73   Hypertension Mother    HIV Brother    Kidney disease Sister    Stomach cancer Neg Hx    CAD Neg Hx    Stroke Neg Hx    Diabetes Neg Hx    Past Surgical History:  Procedure Laterality Date   COLONOSCOPY  01/2012   tubular adenoma Ardis Hughs), rpt 5 yrs   COLONOSCOPY  03/2017   hyperplastic polyp, int hem, rpt 5 yrs Ardis Hughs)   TONSILLECTOMY     as a child   Social History   Occupational History   Occupation: Press photographer and part  Designer, fashion/clothing: [employer]  Tobacco Use   Smoking status: Never   Smokeless tobacco: Never  Vaping Use   Vaping Use: Never used  Substance and Sexual Activity   Alcohol use: Yes    Alcohol/week: 3.0 standard drinks of alcohol    Types: 3 Glasses of wine per week   Drug use: No   Sexual activity: Yes   I spent 49 minutes in the care of the patient today including face-to-face time, preparation to see the patient, as well as review of outside x-rays of the hips; counseling and educating both the patient and his wife on home exercise plan and holistic treatment options; medication discussion; discussion on brief overview of what hip replacement surgery would entail as well as risk/benefits/indications for the above diagnoses.

## 2021-10-18 NOTE — Progress Notes (Signed)
Complaining of right hip pain for about 1 year. Soreness,weakness, loss of motion. Denies any radicular pain. Groin pain.  X-rays were done on 09/05/21. Tylenol for pain PRN

## 2021-12-06 ENCOUNTER — Other Ambulatory Visit: Payer: Self-pay | Admitting: Family Medicine

## 2021-12-06 NOTE — Telephone Encounter (Signed)
Please call patient and schedule CPE for further refills.

## 2022-01-01 ENCOUNTER — Other Ambulatory Visit: Payer: Self-pay | Admitting: Sports Medicine

## 2022-01-04 ENCOUNTER — Telehealth: Payer: Self-pay | Admitting: Sports Medicine

## 2022-01-04 NOTE — Telephone Encounter (Signed)
Pt called requesting call pharmacy with pre auth for pt to get his medication. Please call pt at [phone] when called in to pharmacy

## 2022-01-05 ENCOUNTER — Telehealth: Payer: Self-pay | Admitting: Sports Medicine

## 2022-01-05 NOTE — Telephone Encounter (Signed)
Pt called about the pre auth sent to pharmacy for medication. Dr Rolena Infante ok for medication but still hasn't been sent o pharmacy. Please send to [facility] on [address] Pt phone number is [phone]. Please call pt when sent in

## 2022-01-08 ENCOUNTER — Other Ambulatory Visit: Payer: Self-pay | Admitting: Sports Medicine

## 2022-01-08 MED ORDER — CELECOXIB 200 MG PO CAPS: 200.0000 mg | ORAL_CAPSULE | Freq: Every day | ORAL | 0 refills | Status: DC

## 2022-01-08 NOTE — Telephone Encounter (Signed)
Medication called in 

## 2022-01-13 ENCOUNTER — Other Ambulatory Visit: Payer: Self-pay | Admitting: Family Medicine

## 2022-01-13 DIAGNOSIS — I10 Essential (primary) hypertension: Secondary | ICD-10-CM

## 2022-01-22 ENCOUNTER — Other Ambulatory Visit: Payer: Self-pay | Admitting: Family Medicine

## 2022-01-22 DIAGNOSIS — R7303 Prediabetes: Secondary | ICD-10-CM

## 2022-01-22 DIAGNOSIS — E7800 Pure hypercholesterolemia, unspecified: Secondary | ICD-10-CM

## 2022-01-22 DIAGNOSIS — Z125 Encounter for screening for malignant neoplasm of prostate: Secondary | ICD-10-CM

## 2022-01-25 ENCOUNTER — Other Ambulatory Visit (INDEPENDENT_AMBULATORY_CARE_PROVIDER_SITE_OTHER): Payer: Commercial Managed Care - PPO

## 2022-01-25 DIAGNOSIS — E7800 Pure hypercholesterolemia, unspecified: Secondary | ICD-10-CM | POA: Diagnosis not present

## 2022-01-25 DIAGNOSIS — Z125 Encounter for screening for malignant neoplasm of prostate: Secondary | ICD-10-CM

## 2022-01-25 DIAGNOSIS — R7303 Prediabetes: Secondary | ICD-10-CM | POA: Diagnosis not present

## 2022-01-25 LAB — COMPREHENSIVE METABOLIC PANEL
ALT: 21 U/L (ref 0–53)
AST: 20 U/L (ref 0–37)
Albumin: 4.5 g/dL (ref 3.5–5.2)
Alkaline Phosphatase: 71 U/L (ref 39–117)
BUN: 14 mg/dL (ref 6–23)
CO2: 32 mEq/L (ref 19–32)
Calcium: 9.7 mg/dL (ref 8.4–10.5)
Chloride: 102 mEq/L (ref 96–112)
Creatinine, Ser: 0.84 mg/dL (ref 0.40–1.50)
GFR: 92.3 mL/min (ref >60.00)
Glucose, Bld: 94 mg/dL (ref 70–99)
Potassium: 4.5 mEq/L (ref 3.5–5.1)
Sodium: 141 mEq/L (ref 135–145)
Total Bilirubin: 1 mg/dL (ref 0.2–1.2)
Total Protein: 7.3 g/dL (ref 6.0–8.3)

## 2022-01-25 LAB — LIPID PANEL
Cholesterol: 205 mg/dL — ABNORMAL HIGH (ref 0–200)
HDL: 56.3 mg/dL (ref >39.00)
NonHDL: 148.38
Total CHOL/HDL Ratio: 4
Triglycerides: 229 mg/dL — ABNORMAL HIGH (ref 0.0–149.0)
VLDL: 45.8 mg/dL — ABNORMAL HIGH (ref 0.0–40.0)

## 2022-01-25 LAB — LDL CHOLESTEROL, DIRECT: Direct LDL: 117 mg/dL

## 2022-01-25 LAB — HEMOGLOBIN A1C: Hgb A1c MFr Bld: 5.8 % (ref 4.6–6.5)

## 2022-01-26 LAB — PSA: PSA: 1.67 ng/mL (ref 0.10–4.00)

## 2022-01-30 ENCOUNTER — Encounter: Payer: Self-pay | Admitting: Family Medicine

## 2022-01-30 ENCOUNTER — Ambulatory Visit (INDEPENDENT_AMBULATORY_CARE_PROVIDER_SITE_OTHER): Payer: Commercial Managed Care - PPO | Admitting: Family Medicine

## 2022-01-30 VITALS — BP 138/80 | HR 88 | Temp 97.2°F | Ht 72.0 in | Wt 223.4 lb

## 2022-01-30 DIAGNOSIS — R7303 Prediabetes: Secondary | ICD-10-CM

## 2022-01-30 DIAGNOSIS — M160 Bilateral primary osteoarthritis of hip: Secondary | ICD-10-CM | POA: Diagnosis not present

## 2022-01-30 DIAGNOSIS — Z23 Encounter for immunization: Secondary | ICD-10-CM | POA: Diagnosis not present

## 2022-01-30 DIAGNOSIS — E7800 Pure hypercholesterolemia, unspecified: Secondary | ICD-10-CM

## 2022-01-30 DIAGNOSIS — E669 Obesity, unspecified: Secondary | ICD-10-CM

## 2022-01-30 DIAGNOSIS — R310 Gross hematuria: Secondary | ICD-10-CM

## 2022-01-30 DIAGNOSIS — I10 Essential (primary) hypertension: Secondary | ICD-10-CM

## 2022-01-30 DIAGNOSIS — Z0000 Encounter for general adult medical examination without abnormal findings: Secondary | ICD-10-CM | POA: Diagnosis not present

## 2022-01-30 LAB — POCT URINALYSIS DIP (CLINITEK)
Bilirubin, UA: NEGATIVE
Blood, UA: NEGATIVE
Glucose, UA: NEGATIVE mg/dL
Ketones, POC UA: NEGATIVE mg/dL
Leukocytes, UA: NEGATIVE
Nitrite, UA: NEGATIVE
POC PROTEIN,UA: NEGATIVE
Spec Grav, UA: 1.02 (ref 1.010–1.025)
Urobilinogen, UA: 0.2 E.U./dL
pH, UA: 6 (ref 5.0–8.0)

## 2022-01-30 MED ORDER — BENAZEPRIL HCL 20 MG PO TABS: 20.0000 mg | ORAL_TABLET | Freq: Every day | ORAL | 4 refills | Status: DC

## 2022-01-30 NOTE — Patient Instructions (Addendum)
Flu shot today  You will be due for repeat colonoscopy 03/2022. You may call [facility] to schedule an appointment at [phone].  Decrease added sugars, eliminate trans fats, increase fiber and limit alcohol. Increase fatty fish (salmon, tuna, trout, etc) in the diet - these fish are rich in omega three fatty acids. All these changes together can drop triglycerides by almost 50%.  You are doing well today Return as needed or in 1 year for next physical.   Health Maintenance, Male Adopting a healthy lifestyle and getting preventive care are important in promoting health and wellness. Ask your health care provider about: The right schedule for you to have regular tests and exams. Things you can do on your own to prevent diseases and keep yourself healthy. What should I know about diet, weight, and exercise? Eat a healthy diet  Eat a diet that includes plenty of vegetables, fruits, low-fat dairy products, and lean protein. Do not eat a lot of foods that are high in solid fats, added sugars, or sodium. Maintain a healthy weight Body mass index (BMI) is a measurement that can be used to identify possible weight problems. It estimates body fat based on height and weight. Your health care provider can help determine your BMI and help you achieve or maintain a healthy weight. Get regular exercise Get regular exercise. This is one of the most important things you can do for your health. Most adults should: Exercise for at least 150 minutes each week. The exercise should increase your heart rate and make you sweat (moderate-intensity exercise). Do strengthening exercises at least twice a week. This is in addition to the moderate-intensity exercise. Spend less time sitting. Even light physical activity can be beneficial. Watch cholesterol and blood lipids Have your blood tested for lipids and cholesterol at 65 years of age, then have this test every 5 years. You may need to have your cholesterol  levels checked more often if: Your lipid or cholesterol levels are high. You are older than 65 years of age. You are at high risk for heart disease. What should I know about cancer screening? Many types of cancers can be detected early and may often be prevented. Depending on your health history and family history, you may need to have cancer screening at various ages. This may include screening for: Colorectal cancer. Prostate cancer. Skin cancer. Lung cancer. What should I know about heart disease, diabetes, and high blood pressure? Blood pressure and heart disease High blood pressure causes heart disease and increases the risk of stroke. This is more likely to develop in people who have high blood pressure readings or are overweight. Talk with your health care provider about your target blood pressure readings. Have your blood pressure checked: Every 3-5 years if you are 20-21 years of age. Every year if you are 70 years old or older. If you are between the ages of 4 and 72 and are a current or former smoker, ask your health care provider if you should have a one-time screening for abdominal aortic aneurysm (AAA). Diabetes Have regular diabetes screenings. This checks your fasting blood sugar level. Have the screening done: Once every three years after age 94 if you are at a normal weight and have a low risk for diabetes. More often and at a younger age if you are overweight or have a high risk for diabetes. What should I know about preventing infection? Hepatitis B If you have a higher risk for hepatitis B, you  should be screened for this virus. Talk with your health care provider to find out if you are at risk for hepatitis B infection. Hepatitis C Blood testing is recommended for: Everyone born from [birth year] through 1965. Anyone with known risk factors for hepatitis C. Sexually transmitted infections (STIs) You should be screened each year for STIs, including gonorrhea and chlamydia,  if: You are sexually active and are younger than 65 years of age. You are older than 65 years of age and your health care provider tells you that you are at risk for this type of infection. Your sexual activity has changed since you were last screened, and you are at increased risk for chlamydia or gonorrhea. Ask your health care provider if you are at risk. Ask your health care provider about whether you are at high risk for HIV. Your health care provider may recommend a prescription medicine to help prevent HIV infection. If you choose to take medicine to prevent HIV, you should first get tested for HIV. You should then be tested every 3 months for as long as you are taking the medicine. Follow these instructions at home: Alcohol use Do not drink alcohol if your health care provider tells you not to drink. If you drink alcohol: Limit how much you have to 0-2 drinks a day. Know how much alcohol is in your drink. In the [place] one drink equals one 12 oz bottle of beer (355 mL), one 5 oz glass of wine (148 mL), or one 1 oz glass of hard liquor (44 mL). Lifestyle Do not use any products that contain nicotine or tobacco. These products include cigarettes, chewing tobacco, and vaping devices, such as e-cigarettes. If you need help quitting, ask your health care provider. Do not use street drugs. Do not share needles. Ask your health care provider for help if you need support or information about quitting drugs. General instructions Schedule regular health, dental, and eye exams. Stay current with your vaccines. Tell your health care provider if: You often feel depressed. You have ever been abused or do not feel safe at home. Summary Adopting a healthy lifestyle and getting preventive care are important in promoting health and wellness. Follow your health care provider's instructions about healthy diet, exercising, and getting tested or screened for diseases. Follow your health care provider's  instructions on monitoring your cholesterol and blood pressure. This information is not intended to replace advice given to you by your health care provider. Make sure you discuss any questions you have with your health care provider. Document Revised: 06/06/2020 Document Reviewed: 06/06/2020 Elsevier Patient Education  [employer].

## 2022-01-30 NOTE — Assessment & Plan Note (Signed)
Chronic off statin. Reviewed diet choices to improve triglyceride control. The 10-year ASCVD risk score (Arnett DK, et al., 2019) is: 14.8%   Values used to calculate the score:     Age: 67 years     Sex: Male     Is Non-Hispanic African American: No     Diabetic: No     Tobacco smoker: No     Systolic Blood Pressure: 155 mmHg     Is BP treated: Yes     HDL Cholesterol: 56.3 mg/dL     Total Cholesterol: 205 mg/dL

## 2022-01-30 NOTE — Assessment & Plan Note (Signed)
Encouraged limiting added sugar/carbs in diet.

## 2022-01-30 NOTE — Assessment & Plan Note (Signed)
Encouraged healthy diet and lifestyle choices to affect sustainable weight loss.  ?

## 2022-01-30 NOTE — Progress Notes (Signed)
[name], male    DOB: [birth year], 67 y.o.   MRN: [id number]  This visit was conducted in person.  BP 138/80 (BP Location: Left Arm, Patient Position: Sitting)   Pulse 88   Temp (!) 97.2 F (36.2 C) (Skin)   Ht 6' (1.829 m)   Wt 223 lb 6 oz (101.3 kg)   SpO2 97%   BMI 30.30 kg/m    CC: CPE Subjective:   HPI: Andre [name] is a 67 y.o. male presenting on 01/30/2022 for Annual Exam   Known severe bilateral hip osteoarthritis - saw ortho Dr Andre [name], treating with celebrex '200mg'$  daily as he wants to defer surgery at this time.   Preventative: COLONOSCOPY Date: 01/2012 tubular adenoma Andre [name]), rpt 5 yrs COLONOSCOPY 03/2017 - hyperplastic polyp, int hem, rpt 5 yrs Andre [name]) - fmhx colon cancer Prostate cancer screening - yearly screen Lung cancer screening - not eligible  Flu shot - yearly Td 2007, Tdap 2017 [place] 07/2019 x2, booster x1 Shingrix - 10/2019, 10/2020 Advanced directive: not set up. Packet provided today. Would [name] wife Andre [name] to be HCPOA.  Seat belt use discussed.  Sunscreen use discussed. No changing moles on skin.  Non smoker. Wife smokes outside.  Alcohol - occasional.  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 Interim medical history since our last visit reviewed. Allergies and medications reviewed and updated. Outpatient Medications Prior to Visit  Medication Sig Dispense Refill   celecoxib (CELEBREX) 200 MG capsule Take 1 capsule (200 mg total) by mouth daily. 30 capsule 0   Multiple Vitamins-Minerals (MULTIVITAMIN & MINERAL PO) Take 1 tablet by mouth daily.     benazepril (LOTENSIN) 20 MG tablet TAKE 1 TABLET BY  MOUTH ONCE DAILY . APPOINTMENT REQUIRED FOR FUTURE REFILLS 30 tablet 0   celecoxib (CELEBREX) 200 MG capsule Take 1 capsule (200 mg total) by mouth daily. 30 capsule 1   No facility-administered medications prior to visit.     Per HPI unless specifically indicated in ROS section below Review of Systems  Constitutional:  Negative for activity change, appetite change, chills, fatigue, fever and unexpected weight change.  HENT:  Negative for hearing loss.   Eyes:  Negative for visual disturbance.  Respiratory:  Negative for cough, chest tightness, shortness of breath and wheezing.   Cardiovascular:  Negative for chest pain, palpitations and leg swelling.  Gastrointestinal:  Negative for abdominal distention, abdominal pain, blood in stool, constipation, diarrhea, nausea and vomiting.  Genitourinary:  Negative for difficulty urinating and hematuria.  Musculoskeletal:  Negative for arthralgias, myalgias and neck pain.  Skin:  Negative for rash.  Neurological:  Negative for dizziness, seizures, syncope and headaches.  Hematological:  Negative for adenopathy. Does not bruise/bleed easily.  Psychiatric/Behavioral:  Negative for dysphoric mood. The patient is not nervous/anxious.     Objective:  BP 138/80 (BP Location: Left Arm, Patient Position: Sitting)   Pulse 88   Temp (!) 97.2 F (36.2 C) (Skin)   Ht 6' (1.829 m)   Wt 223 lb 6 oz (101.3 kg)   SpO2 97%   BMI 30.30 kg/m   Wt Readings from Last 3 Encounters:  01/30/22 223 lb 6 oz (101.3 kg)  10/18/21 225 lb (102.1 kg)  09/05/21 214 lb 2 oz (97.1 kg)      Physical Exam Vitals and nursing note reviewed.  Constitutional:      General: He is not in acute distress.    Appearance: Normal appearance. He is well-developed. He is not ill-appearing.  HENT:     Head: Normocephalic and atraumatic.     Right Ear: Hearing, tympanic membrane, ear canal and external ear normal.     Left Ear: Hearing, tympanic membrane, ear canal and external  ear normal.  Eyes:     General: No scleral icterus.    Extraocular Movements: Extraocular movements intact.     Conjunctiva/sclera: Conjunctivae normal.     Pupils: Pupils are equal, round, and reactive to light.  Neck:     Thyroid: No thyroid mass or thyromegaly.  Cardiovascular:     Rate and Rhythm: Normal rate and regular rhythm.     Pulses: Normal pulses.          Radial pulses are 2+ on the right side and 2+ on the left side.     Heart sounds: Normal heart sounds. No murmur heard. Pulmonary:     Effort: Pulmonary effort is normal. No respiratory distress.     Breath sounds: Normal breath sounds. No wheezing, rhonchi or rales.  Abdominal:     General: Bowel sounds are normal. There is no distension.     Palpations: Abdomen is soft. There is no mass.     Tenderness: There is no abdominal tenderness. There is no guarding or rebound.     Hernia: No hernia is present.  Musculoskeletal:        General: Normal range of motion.     Cervical back: Normal range of motion and neck supple.     Right lower leg: No edema.     Left lower leg: No edema.  Lymphadenopathy:     Cervical: No cervical adenopathy.  Skin:    General: Skin is warm and dry.     Findings: No rash.     Comments:  Blackhead to lower mid back  2 separate epidermal cysts to back without erythema or drainage  Neurological:     General: No focal deficit present.     Mental Status: He is alert and oriented to person, place, and time.  Psychiatric:        Mood and Affect: Mood normal.        Behavior: Behavior normal.        Thought Content: Thought content normal.        Judgment: Judgment normal.       Results for orders placed or performed in visit on 01/30/22  POCT URINALYSIS DIP (CLINITEK)  Result Value Ref Range   Color, UA yellow yellow   Clarity, UA clear clear   Glucose, UA negative negative mg/dL   Bilirubin, UA negative negative   Ketones, POC UA negative negative mg/dL   Spec Grav, UA 1.020 1.010  - 1.025   Blood, UA negative negative   pH, UA 6.0 5.0 - 8.0   POC PROTEIN,UA negative negative, trace   Urobilinogen, UA 0.2 0.2 or 1.0 E.U./dL   Nitrite, UA Negative Negative   Leukocytes, UA Negative Negative    Assessment & Plan:   Problem List Items Addressed This Visit     Health maintenance examination - Primary (Chronic)    Preventative protocols reviewed and updated unless pt declined.  Discussed healthy diet and lifestyle.       HYPERCHOLESTEROLEMIA    Chronic off statin. Reviewed diet choices to improve triglyceride control. The 10-year ASCVD risk score (Arnett DK, et al., 2019) is: 14.8%   Values used to calculate the score:     Age: 82 years     Sex: Male     Is Non-Hispanic African American: No     Diabetic: No     Tobacco smoker: No     Systolic Blood Pressure: 272 mmHg     Is BP treated: Yes     HDL Cholesterol: 56.3 mg/dL     Total Cholesterol: 205 mg/dL       Relevant Medications   benazepril (LOTENSIN) 20 MG tablet   Hypertension    Chronic, stable. Continue current regimen.       Relevant Medications   benazepril (LOTENSIN) 20 MG tablet   Prediabetes    Encouraged limiting added sugar/carbs in diet.       Gross hematuria    H/o kidney stones. No recent gross hematuria. Update UA today.  Last saw urology 2016 - large kidney stone, declined further treatment.       Relevant Orders   POCT URINALYSIS DIP (CLINITEK) (Completed)   Bilateral primary osteoarthritis of hip    Appreciate orthopedic care. Using celebrex '200mg'$  daily with benefit through Dr Andre [name]. Desires to defer surgical intervention at this time.       Obesity, Class I, BMI 30.0-34.9 (see actual BMI)    Encouraged healthy diet and lifestyle choices to affect sustainable weight loss.       Other Visit Diagnoses     Need for influenza vaccination       Relevant Orders   Flu Vaccine QUAD 6+ mos PF IM (Fluarix Quad PF) (Completed)        Meds ordered this encounter   Medications   benazepril (LOTENSIN) 20 MG tablet    Sig: Take 1 tablet (20 mg total) by mouth daily.    Dispense:  90 tablet    Refill:  4   Orders Placed This Encounter  Procedures   Flu Vaccine QUAD 6+ mos PF IM (Fluarix Quad PF)   POCT URINALYSIS DIP (CLINITEK)    Patient instructions: Flu shot today  You will be due for repeat colonoscopy 03/2022. You may call [facility] to schedule an appointment at [phone].  Decrease added sugars, eliminate trans fats, increase fiber and limit alcohol. Increase fatty fish (salmon, tuna, trout, etc) in the diet - these fish are rich in omega three fatty acids. All these changes together can drop triglycerides by almost 50%.  You are doing well today Return as needed or in 1 year for next physical.   Follow up plan: Return in about 1 year (around 01/31/2023) for annual exam, prior fasting for blood work.  Ria Bush, MD

## 2022-01-30 NOTE — Assessment & Plan Note (Addendum)
H/o kidney stones. No recent gross hematuria. Update UA today.  Last saw urology 2016 - large kidney stone, declined further treatment.

## 2022-01-30 NOTE — Assessment & Plan Note (Addendum)
Appreciate orthopedic care. Using celebrex '200mg'$  daily with benefit through Dr Rolena Infante. Desires to defer surgical intervention at this time.

## 2022-01-30 NOTE — Assessment & Plan Note (Signed)
Chronic, stable. Continue current regimen. 

## 2022-01-30 NOTE — Assessment & Plan Note (Signed)
Preventative protocols reviewed and updated unless pt declined. Discussed healthy diet and lifestyle.  

## 2022-02-20 ENCOUNTER — Ambulatory Visit: Payer: Commercial Managed Care - PPO | Admitting: Sports Medicine

## 2022-02-20 ENCOUNTER — Encounter: Payer: Self-pay | Admitting: Sports Medicine

## 2022-02-20 VITALS — Ht 73.0 in | Wt 223.8 lb

## 2022-02-20 DIAGNOSIS — G8929 Other chronic pain: Secondary | ICD-10-CM | POA: Diagnosis not present

## 2022-02-20 DIAGNOSIS — M25551 Pain in right hip: Secondary | ICD-10-CM

## 2022-02-20 DIAGNOSIS — M160 Bilateral primary osteoarthritis of hip: Secondary | ICD-10-CM | POA: Diagnosis not present

## 2022-02-20 MED ORDER — CELECOXIB 200 MG PO CAPS: 200.0000 mg | ORAL_CAPSULE | Freq: Every day | ORAL | 1 refills | Status: DC

## 2022-02-20 NOTE — Progress Notes (Signed)
Bilateral hip pain; right worse than left  States the celebrex does help a lot  Interested in talking with surgeon about THA

## 2022-02-20 NOTE — Progress Notes (Signed)
Andre [name] - 67 y.o. male MRN [id number]  Date of birth: [birth year]  Office Visit Note: Visit Date: 02/20/2022 PCP: Ria Bush, MD Referred by: Ria Bush, MD  Subjective: Chief Complaint  Patient presents with   Right Hip - Pain   HPI: Andre [name] is a pleasant 67 y.o. male who presents today for follow-up of bilateral hip osteoarthritis pain, R > L.   Bilateral hip pain with known osteoarthritis with bone-on-bone arthritic change of both hips, right worse than left.  At last visit, he was not ready to discuss surgical replacement at that time.  We did start him on Celebrex 200 mg daily, he does notice a big difference when he is on this, but still has pain and restricted ROM. He feels at this time he may be ready to move forward with right hip replacement, as this is the most bothersome for him.  Limiting his activities of daily living with working, walking, getting in and out of the car.   He works as a Arts development officer.  *Pertinent surgical history: - No tobacco use - No diabetes or CAD - Social drinker only a few drinks on weekends only -Body mass index is 29.53 kg/m.  Pertinent ROS were reviewed with the patient and found to be negative unless otherwise specified above in HPI.   Assessment & Plan: Visit Diagnoses:  1. Bilateral primary osteoarthritis of hip   2. Chronic right hip pain    Plan: Discussed with Andre [name] and his wife there is bilateral hip osteoarthritis, with the right hip more bothersome than the left.  We did again review his x-rays which show severe osteoarthritis of both hips, R > L.  He has had temporary relief with Celebrex therapy and some home rehab.  He is not interested in injection therapy, but would like to proceed with right hip replacement, which I feel is very reasonable given his degree of pain, there is restriction with range of motion, and failing conservative measures.  Will have him see Dr. Ninfa [name] to discuss right hip  THA.  I did provide a general overview of the surgery and expected recovery with him today as well.  Would be a good surgical candidate, see information above.  Follow-up: Return for Schedule appt with Andre [name] or Andre [name] to discuss right hip THA.   Meds & Orders:  Meds ordered this encounter  Medications   celecoxib (CELEBREX) 200 MG capsule    Sig: Take 1 capsule (200 mg total) by mouth daily.    Dispense:  30 capsule    Refill:  1   No orders of the defined types were placed in this encounter.    Procedures: No procedures performed      Clinical History: No specialty comments available.  He reports that he has never smoked. He has never used smokeless tobacco.  Recent Labs    01/25/22 0802  HGBA1C 5.8    Objective:   Vital Signs: Ht '6\' 1"'$  (1.854 m)   Wt 223 lb 12.8 oz (101.5 kg)   BMI 29.53 kg/m   Physical Exam  Gen: Well-appearing, in no acute distress; non-toxic CV: Well-perfused. Warm.  Resp: Breathing unlabored on room air; no wheezing. Psych: Fluid speech in conversation; appropriate affect; normal thought process Neuro: Sensation intact throughout. No gross coordination deficits.   Ortho Exam - Bilateral hips: No true bony TTP.  No overlying erythema or swelling.  There is significant restriction with passive logroll internally.  Right hip only about 5 degrees  of internal rotation, left hip about 15 degrees, compared to rather preserved external range of motion.  Positive FADIR testing with bony block and pain.  Patient does walk with a right-sided limited leg swing.  Difficulty going from a seated to standing position.   Imaging:  *3 view bilateral hip x-ray from 09/05/2021 was independently reviewed by myself. X-rays demonstrate severe, bone-on-bone arthritic change of bilateral hip joints. There is significant bony spurring and sclerotic change throughout each hip. There is acetabular bony cyst formation, right greater than left. Varus deformation   DG HIPS BILAT W  OR W/O PELVIS 3-4 VIEWS CLINICAL DATA:  Bilateral hip pain, right greater than left  EXAM: DG HIP (WITH OR WITHOUT PELVIS) 3-4V BILAT  COMPARISON:  09/02/2019  FINDINGS: Frontal view of the pelvis as well as frontal and frogleg lateral views of both hips are obtained. No acute fracture, subluxation, or dislocation. There is severe progressive bilateral hip osteoarthritis, left greater than right. Sacroiliac joints are unremarkable. Mild lower lumbar spondylosis and facet hypertrophy.  IMPRESSION: 1. Severe progressive bilateral hip osteoarthritis, left greater than right. 2. No acute bony abnormality.  Electronically Signed   By: Randa Ngo M.D.   On: 09/05/2021 18:22    Past Medical/Family/Surgical/Social History: Medications & Allergies reviewed per EMR, new medications updated. Patient Active Problem List   Diagnosis Date Noted   Crushing injury of left foot 01/08/2020   Advanced directives, counseling/discussion 11/04/2019   Obesity, Class I, BMI 30.0-34.9 (see actual BMI) 11/04/2019   Bilateral primary osteoarthritis of hip 09/02/2019   Erectile dysfunction 01/11/2018   Epidermal inclusion cyst 12/13/2014   Gross hematuria 05/03/2014   Achilles tendon pain 12/31/2013   Rosacea 04/20/2010   Health maintenance examination 04/20/2010   Prediabetes 09/06/2008   Hypertension 12/12/2006   HYPERCHOLESTEROLEMIA 11/25/2006   Past Medical History:  Diagnosis Date   COVID-19 virus infection 01/2019   Essential hypertension, benign    History of colon polyps 2014   Nephrolithiasis 06/2014   ER visit   Pure hypercholesterolemia    Family History  Problem Relation Age of Onset   Colon cancer Sister 23   Breast cancer Sister 53   Hypertension Mother    HIV Brother    Kidney disease Sister    Stomach cancer Neg Hx    CAD Neg Hx    Stroke Neg Hx    Diabetes Neg Hx    Past Surgical History:  Procedure Laterality Date   COLONOSCOPY  01/2012   tubular adenoma  Ardis Hughs), rpt 5 yrs   COLONOSCOPY  03/2017   hyperplastic polyp, int hem, rpt 5 yrs Ardis Hughs)   TONSILLECTOMY     as a child   Social History   Occupational History   Occupation: Press photographer and part Designer, fashion/clothing: [employer]  Tobacco Use   Smoking status: Never   Smokeless tobacco: Never  Vaping Use   Vaping Use: Never used  Substance and Sexual Activity   Alcohol use: Yes    Alcohol/week: 3.0 standard drinks of alcohol    Types: 3 Glasses of wine per week   Drug use: No   Sexual activity: Yes   I spent 34 minutes in the care of the patient today including face-to-face time, preparation to see the patient, as well as review and discussion of x-ray findings with patient and his wife, conservative treatment options, and discussing general overview of what a possible total hip replacement would entail as well as the expected recovery (  to be performed by one of my orthopedic colleagues) for the above diagnoses.   Elba Barman, DO Primary Care Sports Medicine Physician  [place]  This note was dictated using Dragon naturally speaking software and may contain errors in syntax, spelling, or content which have not been identified prior to signing this note.

## 2022-03-15 ENCOUNTER — Encounter: Payer: Self-pay | Admitting: Orthopaedic Surgery

## 2022-03-15 ENCOUNTER — Ambulatory Visit: Payer: Commercial Managed Care - PPO | Admitting: Orthopaedic Surgery

## 2022-03-15 DIAGNOSIS — M1612 Unilateral primary osteoarthritis, left hip: Secondary | ICD-10-CM | POA: Insufficient documentation

## 2022-03-15 DIAGNOSIS — M1611 Unilateral primary osteoarthritis, right hip: Secondary | ICD-10-CM | POA: Insufficient documentation

## 2022-03-15 NOTE — Progress Notes (Signed)
The patient is an active 67 year old gentleman sent to me by one of my partners to evaluate and treat severe end-stage arthritis of both his hips with the right hurting worse than the left.  This has been getting worse for several years now.  At this point his right hip and left hip pain are daily.  They definitely affect his mobility, his quality of life and his actives daily living.  It is affecting his posture as well.  His wife is with him today and said that he now walks with a significant limp and she can tell is been hurting worse with time.  He has tried activity modification.  He is not obese.  He has tried anti-inflammatories and time.  He is not on blood thinning medication.  He is not a diabetic.  He just takes Lotensin for high blood pressure which is controlled.  There is currently listed no headache, chest pain, shortness of breath, fever, chills, nausea, vomiting.  X-rays reviewed with the patient.  He has severe end-stage bone-on-bone arthritis of both hips.  There is complete loss of the joint space bilaterally.  There are sclerotic changes and large osteophytes with both hips and flattening of the superior lateral femoral head on both sides.  His hip joint space is too narrow for even considering steroid injections at this point.  Also, physical therapy would not be helpful or useful other than a home exercise program with hip abduction exercises given the severity of his arthritis.  On exam I can barely even rotate either hip at all and there is severe pain in the groin with any attempted rotation of either hip.  I had a long and thorough discussion about hip replacement surgery with him.  I discussed the risks and benefits of the surgery and what to expect from an intraoperative postoperative course.  I went over his x-rays and hip replacement model and gave them a handout about hip replacement surgery.  They do wish to proceed with a right total hip arthroplasty in the near future.  Will  work on hopefully making this happen and be in touch.  All questions and concerns were addressed and answered.

## 2022-04-28 ENCOUNTER — Other Ambulatory Visit: Payer: Self-pay | Admitting: Sports Medicine

## 2022-04-30 ENCOUNTER — Other Ambulatory Visit: Payer: Self-pay | Admitting: Sports Medicine

## 2022-04-30 ENCOUNTER — Telehealth: Payer: Self-pay | Admitting: Orthopaedic Surgery

## 2022-04-30 MED ORDER — CELECOXIB 200 MG PO CAPS: 200.0000 mg | ORAL_CAPSULE | Freq: Every day | ORAL | 1 refills | Status: DC

## 2022-04-30 NOTE — Telephone Encounter (Signed)
Patient called. He would like a refill on Celebrex. [phone]

## 2022-05-04 ENCOUNTER — Encounter: Payer: Self-pay | Admitting: Gastroenterology

## 2022-05-16 ENCOUNTER — Other Ambulatory Visit: Payer: Self-pay

## 2022-05-22 ENCOUNTER — Other Ambulatory Visit: Payer: Self-pay | Admitting: Physician Assistant

## 2022-05-22 DIAGNOSIS — Z01818 Encounter for other preprocedural examination: Secondary | ICD-10-CM

## 2022-05-23 NOTE — Patient Instructions (Addendum)
SURGICAL WAITING ROOM VISITATION Patients having surgery or a procedure may have no more than 2 support people in the waiting area - these visitors may rotate in the visitor waiting room.   Due to an increase in RSV and influenza rates and associated hospitalizations, children ages 7 and under may not visit patients in [facility] hospitals. If the patient needs to stay at the hospital during part of their recovery, the visitor guidelines for inpatient rooms apply.  PRE-OP VISITATION  Pre-op nurse will coordinate an appropriate time for 1 support person to accompany the patient in pre-op.  This support person may not rotate.  This visitor will be contacted when the time is appropriate for the visitor to come back in the pre-op area.  Please refer to the [facility] website for the visitor guidelines for Inpatients (after your surgery is over and you are in a regular room).  You are not required to quarantine at this time prior to your surgery. However, you must do this: Hand Hygiene often Do NOT share personal items Notify your provider if you are in close contact with someone who has COVID or you develop fever 100.4 or greater, new onset of sneezing, cough, sore throat, shortness of breath or body aches.  If you test positive for Covid or have been in contact with anyone that has tested positive in the last 10 days please notify you surgeon.    Your procedure is scheduled on:  Jun 01, 2022  Report to [facility] Main Entrance: Andre [name] entrance where the [employer] is available.   Report to admitting at: 05:15  AM  +++++Call this number if you have any questions or problems the morning of surgery [phone]  Do not eat food after Midnight the night prior to your surgery/procedure.  After Midnight you may have the following liquids until  04:15 AM  DAY OF SURGERY  Clear Liquid Diet Water Black Coffee (sugar ok, NO MILK/CREAM OR CREAMERS)  Tea (sugar ok, NO MILK/CREAM OR  CREAMERS) regular and decaf                             Plain Jell-O  with no fruit (NO RED)                                           Fruit ices (not with fruit pulp, NO RED)                                     Popsicles (NO RED)                                                                  Juice: apple, WHITE grape, WHITE cranberry Sports drinks like Gatorade or Powerade (NO RED)                    The day of surgery:  Drink ONE (1) Pre-Surgery G2 at   04:15 AM the morning of surgery. Drink in one sitting. Do not sip.  This drink was given to you during your hospital pre-op appointment visit. Nothing else to drink after completing the Pre-Surgery G2 : No candy, chewing gum or throat lozenges.    FOLLOW  ANY ADDITIONAL PRE OP INSTRUCTIONS YOU RECEIVED FROM YOUR SURGEON'S OFFICE!!!   Oral Hygiene is also important to reduce your risk of infection.        Remember - BRUSH YOUR TEETH THE MORNING OF SURGERY WITH YOUR REGULAR TOOTHPASTE  Do NOT smoke after Midnight the night before surgery.  Take ONLY these medicines the morning of surgery with A SIP OF WATER: none                   You may not have any metal on your body including  jewelry, and body piercing  Do not wear lotions, powders, cologne, or deodorant  Men may shave face and neck.  Contacts, Hearing Aids, dentures or bridgework may not be worn into surgery. DENTURES WILL BE REMOVED PRIOR TO SURGERY PLEASE DO NOT APPLY "Poly grip" OR ADHESIVES!!!  You may bring a small overnight bag with you on the day of surgery, only pack items that are not valuable. [facility] IS NOT RESPONSIBLE   FOR VALUABLES THAT ARE LOST OR STOLEN.   Do not bring your home medications to the hospital. The Pharmacy will dispense medications listed on your medication list to you during your admission in the Hospital.  Special Instructions: Bring a copy of your healthcare power of attorney and living will documents the day of surgery, if you wish  to have them scanned into your [place] Medical Records- EPIC  Please read over the following fact sheets you were given: IF YOU HAVE QUESTIONS ABOUT YOUR PRE-OP INSTRUCTIONS, PLEASE CALL [phone].   +++++++ PLEASE FOLLOW THE ATTACHED INFORMATION REGARDING SHOWERING / BATHING SCHEDULE  PRIOR TO YOUR SURGERY. Start this schedule on :  Monday May 28, 2022  ON THE DAY OF SURGERY : Do not apply any lotions/deodorants the morning of surgery.  Please wear clean clothes to the hospital/surgery center.    FAILURE TO FOLLOW THESE INSTRUCTIONS MAY RESULT IN THE CANCELLATION OF YOUR SURGERY  PATIENT SIGNATURE_________________________________  NURSE SIGNATURE__________________________________  ________________________________________________________________________      Andre [name]    An incentive spirometer is a tool that can help keep your lungs clear and active. This tool measures how well you are filling your lungs with each breath. Taking long deep breaths may help reverse or decrease the chance of developing breathing (pulmonary) problems (especially infection) following: A long period of time when you are unable to move or be active. BEFORE THE PROCEDURE  If the spirometer includes an indicator to show your best effort, your nurse or respiratory therapist will set it to a desired goal. If possible, sit up straight or lean slightly forward. Try not to slouch. Hold the incentive spirometer in an upright position. INSTRUCTIONS FOR USE  Sit on the edge of your bed if possible, or sit up as far as you can in bed or on a chair. Hold the incentive spirometer in an upright position. Breathe out normally. Place the mouthpiece in your mouth and seal your lips tightly around it. Breathe in slowly and as deeply as possible, raising the piston or the ball toward the top of the column. Hold your breath for 3-5 seconds or for as long as possible. Allow the piston or ball to fall  to the bottom of the column. Remove the mouthpiece from your mouth  and breathe out normally. Rest for a few seconds and repeat Steps 1 through 7 at least 10 times every 1-2 hours when you are awake. Take your time and take a few normal breaths between deep breaths. The spirometer may include an indicator to show your best effort. Use the indicator as a goal to work toward during each repetition. After each set of 10 deep breaths, practice coughing to be sure your lungs are clear. If you have an incision (the cut made at the time of surgery), support your incision when coughing by placing a pillow or rolled up towels firmly against it. Once you are able to get out of bed, walk around indoors and cough well. You may stop using the incentive spirometer when instructed by your caregiver.  RISKS AND COMPLICATIONS Take your time so you do not get dizzy or light-headed. If you are in pain, you may need to take or ask for pain medication before doing incentive spirometry. It is harder to take a deep breath if you are having pain. AFTER USE Rest and breathe slowly and easily. It can be helpful to keep track of a log of your progress. Your caregiver can provide you with a simple table to help with this. If you are using the spirometer at home, follow these instructions: SEEK MEDICAL CARE IF:  You are having difficultly using the spirometer. You have trouble using the spirometer as often as instructed. Your pain medication is not giving enough relief while using the spirometer. You develop fever of 100.5 F (38.1 C) or higher.                                                                                                    SEEK IMMEDIATE MEDICAL CARE IF:  You cough up bloody sputum that had not been present before. You develop fever of 102 F (38.9 C) or greater. You develop worsening pain at or near the incision site. MAKE SURE YOU:  Understand these instructions. Will watch your condition. Will  get help right away if you are not doing well or get worse. Document Released: 05/28/2006 Document Revised: 04/09/2011 Document Reviewed: 07/29/2006 [facility] Patient Information 2014 [place], [place].       WHAT IS A BLOOD TRANSFUSION? Blood Transfusion Information  A transfusion is the replacement of blood or some of its parts. Blood is made up of multiple cells which provide different functions. Red blood cells carry oxygen and are used for blood loss replacement. White blood cells fight against infection. Platelets control bleeding. Plasma helps clot blood. Other blood products are available for specialized needs, such as hemophilia or other clotting disorders. BEFORE THE TRANSFUSION  Who gives blood for transfusions?  Healthy volunteers who are fully evaluated to make sure their blood is safe. This is blood bank blood. Transfusion therapy is the safest it has ever been in the practice of medicine. Before blood is taken from a donor, a complete history is taken to make sure that person has no history of diseases nor engages in risky social behavior (examples are intravenous drug use or  sexual activity with multiple partners). The donor's travel history is screened to minimize risk of transmitting infections, such as malaria. The donated blood is tested for signs of infectious diseases, such as HIV and hepatitis. The blood is then tested to be sure it is compatible with you in order to minimize the chance of a transfusion reaction. If you or a relative donates blood, this is often done in anticipation of surgery and is not appropriate for emergency situations. It takes many days to process the donated blood. RISKS AND COMPLICATIONS Although transfusion therapy is very safe and saves many lives, the main dangers of transfusion include:  Getting an infectious disease. Developing a transfusion reaction. This is an allergic reaction to something in the blood you were given. Every precaution is  taken to prevent this. The decision to have a blood transfusion has been considered carefully by your caregiver before blood is given. Blood is not given unless the benefits outweigh the risks. AFTER THE TRANSFUSION Right after receiving a blood transfusion, you will usually feel much better and more energetic. This is especially true if your red blood cells have gotten low (anemic). The transfusion raises the level of the red blood cells which carry oxygen, and this usually causes an energy increase. The nurse administering the transfusion will monitor you carefully for complications. HOME CARE INSTRUCTIONS  No special instructions are needed after a transfusion. You may find your energy is better. Speak with your caregiver about any limitations on activity for underlying diseases you may have. SEEK MEDICAL CARE IF:  Your condition is not improving after your transfusion. You develop redness or irritation at the intravenous (IV) site. SEEK IMMEDIATE MEDICAL CARE IF:  Any of the following symptoms occur over the next 12 hours: Shaking chills. You have a temperature by mouth above 102 F (38.9 C), not controlled by medicine. Chest, back, or muscle pain. People around you feel you are not acting correctly or are confused. Shortness of breath or difficulty breathing. Dizziness and fainting. You get a rash or develop hives. You have a decrease in urine output. Your urine turns a dark color or changes to pink, red, or brown. Any of the following symptoms occur over the next 10 days: You have a temperature by mouth above 102 F (38.9 C), not controlled by medicine. Shortness of breath. Weakness after normal activity. The white part of the eye turns yellow (jaundice). You have a decrease in the amount of urine or are urinating less often. Your urine turns a dark color or changes to pink, red, or brown. Document Released: 01/13/2000 Document Revised: 04/09/2011 Document Reviewed:  09/01/2007 [facility] Patient Information 2014 [place], [place].  _______________________________________________________________________

## 2022-05-23 NOTE — Progress Notes (Addendum)
COVID Vaccine received:  []  No [x]  Yes Date of any COVID positive Test in last 90 days: None  PCP - Eustaquio Boyden, MD Cardiologist -  None  Chest x-ray - 07-30-2021  Epic EKG -08-02-2021  Epic   Stress Test -  ECHO -  Cardiac Cath -   PCR screen: [x]  Ordered & Completed           []   No Order but Needs PROFEND           []   N/A for this surgery  Surgery Plan:  []  Ambulatory                            [x]  Outpatient in bed                            []  Admit  Anesthesia:    []  General  [x]  Spinal                           []   Choice []   MAC  Pacemaker / ICD device [x]  No []  Yes   Spinal Cord Stimulator:[x]  No []  Yes       History of Sleep Apnea? [x]  No []  Yes   CPAP used?- [x]  No []  Yes    Does the patient monitor blood sugar?          [x]  No []  Yes  []  N/A Last A1c: 01-25-2022 ?5.8  WNL Patient has: []  NO Hx DM   [x]  Pre-DM                 []  DM1  []   DM2 Does patient have a [employer] or Dexacom? []  No []  Yes   Fasting Blood Sugar Ranges-  Checks Blood Sugar _0_  times a day  Blood Thinner / Instructions: None Aspirin Instructions: None  ERAS Protocol Ordered: []  No  [x]  Yes PRE-SURGERY []  ENSURE  [x]  G2  Patient is to be NPO after: 04:15  Activity level: Patient is able to climb a flight of stairs without difficulty; [x]  No CP  [x]  No SOB, but would have leg pain.  Patient can perform ADLs without assistance.   Anesthesia review: HTN, Pre-DM (Diet only)  Patient denies shortness of breath, fever, cough and chest pain at PAT appointment.  Patient verbalized understanding and agreement to the Pre-Surgical Instructions that were given to them at this PAT appointment. Patient was also educated of the need to review these PAT instructions again prior to his surgery.I reviewed the appropriate phone numbers to call if they have any and questions or concerns.

## 2022-05-28 ENCOUNTER — Encounter (HOSPITAL_COMMUNITY): Payer: Self-pay

## 2022-05-28 ENCOUNTER — Encounter (HOSPITAL_COMMUNITY)
Admission: RE | Admit: 2022-05-28 | Discharge: 2022-05-28 | Disposition: A | Discharge status: Home / Self Care | Payer: Commercial Managed Care - PPO | Source: Ambulatory Visit | Attending: Orthopaedic Surgery | Admitting: Orthopaedic Surgery

## 2022-05-28 ENCOUNTER — Other Ambulatory Visit: Payer: Self-pay

## 2022-05-28 VITALS — BP 146/92 | Temp 98.5°F | Resp 16 | Ht 72.0 in | Wt 223.0 lb

## 2022-05-28 DIAGNOSIS — Z01812 Encounter for preprocedural laboratory examination: Secondary | ICD-10-CM | POA: Insufficient documentation

## 2022-05-28 DIAGNOSIS — R7303 Prediabetes: Secondary | ICD-10-CM | POA: Insufficient documentation

## 2022-05-28 DIAGNOSIS — Z01818 Encounter for other preprocedural examination: Secondary | ICD-10-CM

## 2022-05-28 HISTORY — DX: Personal history of urinary calculi: Z87.442

## 2022-05-28 HISTORY — DX: Unspecified osteoarthritis, unspecified site: M19.90

## 2022-05-28 LAB — HEMOGLOBIN A1C
Hgb A1c MFr Bld: 5.4 % (ref 4.8–5.6)
Mean Plasma Glucose: 108.28 mg/dL

## 2022-05-28 LAB — COMPREHENSIVE METABOLIC PANEL
ALT: 20 U/L (ref 0–44)
AST: 20 U/L (ref 15–41)
Albumin: 4.1 g/dL (ref 3.5–5.0)
Alkaline Phosphatase: 64 U/L (ref 38–126)
Anion gap: 7 (ref 5–15)
BUN: 13 mg/dL (ref 8–23)
CO2: 28 mmol/L (ref 22–32)
Calcium: 9.7 mg/dL (ref 8.9–10.3)
Chloride: 102 mmol/L (ref 98–111)
Creatinine, Ser: 0.7 mg/dL (ref 0.61–1.24)
GFR, Estimated: >60 mL/min (ref >60)
Glucose, Bld: 110 mg/dL — ABNORMAL HIGH (ref 70–99)
Potassium: 3.9 mmol/L (ref 3.5–5.1)
Sodium: 137 mmol/L (ref 135–145)
Total Bilirubin: 0.8 mg/dL (ref 0.3–1.2)
Total Protein: 7.5 g/dL (ref 6.5–8.1)

## 2022-05-28 LAB — CBC
HCT: 49.1 % (ref 39.0–52.0)
Hemoglobin: 16.6 g/dL (ref 13.0–17.0)
MCH: 28.7 pg (ref 26.0–34.0)
MCHC: 33.8 g/dL (ref 30.0–36.0)
MCV: 84.8 fL (ref 80.0–100.0)
Platelets: 225 10*3/uL (ref 150–400)
RBC: 5.79 MIL/uL (ref 4.22–5.81)
RDW: 13.2 % (ref 11.5–15.5)
WBC: 6.3 10*3/uL (ref 4.0–10.5)
nRBC: 0 % (ref 0.0–0.2)

## 2022-05-28 LAB — SURGICAL PCR SCREEN
MRSA, PCR: NEGATIVE
Staphylococcus aureus: NEGATIVE

## 2022-05-28 LAB — GLUCOSE, CAPILLARY: Glucose-Capillary: 129 mg/dL — ABNORMAL HIGH (ref 70–99)

## 2022-05-31 NOTE — H&P (Signed)
TOTAL HIP ADMISSION H&P  Patient is admitted for right total hip arthroplasty.  Subjective:  Chief Complaint: right hip pain  HPI: Andre [name], 67 y.o. male, has a history of pain and functional disability in the right hip(s) due to arthritis and patient has failed non-surgical conservative treatments for greater than 12 weeks to include NSAID's and/or analgesics and activity modification.  Onset of symptoms was gradual starting 4 years ago with gradually worsening course since that time.The patient noted no past surgery on the right hip(s).  Patient currently rates pain in the right hip at 10 out of 10 with activity. Patient has night pain, worsening of pain with activity and weight bearing, pain that interfers with activities of daily living, and pain with passive range of motion. Patient has evidence of subchondral sclerosis, periarticular osteophytes, and joint space narrowing by imaging studies. This condition presents safety issues increasing the risk of falls.  There is no current active infection.  Patient Active Problem List   Diagnosis Date Noted   Unilateral primary osteoarthritis, right hip 03/15/2022   Unilateral primary osteoarthritis, left hip 03/15/2022   Crushing injury of left foot 01/08/2020   Advanced directives, counseling/discussion 11/04/2019   Obesity, Class I, BMI 30.0-34.9 (see actual BMI) 11/04/2019   Bilateral primary osteoarthritis of hip 09/02/2019   Erectile dysfunction 01/11/2018   Epidermal inclusion cyst 12/13/2014   Gross hematuria 05/03/2014   Achilles tendon pain 12/31/2013   Rosacea 04/20/2010   Health maintenance examination 04/20/2010   Prediabetes 09/06/2008   Hypertension 12/12/2006   HYPERCHOLESTEROLEMIA 11/25/2006   Past Medical History:  Diagnosis Date   Arthritis    COVID-19 virus infection 01/2019   Essential hypertension, benign    History of colon polyps 2014   History of kidney stones    Nephrolithiasis 06/2014   ER visit    Pure hypercholesterolemia     Past Surgical History:  Procedure Laterality Date   COLONOSCOPY  01/2012   tubular adenoma [name]), rpt 5 yrs   COLONOSCOPY  03/2017   hyperplastic polyp, int hem, rpt 5 yrs [name])   TONSILLECTOMY     as a child    No current facility-administered medications for this encounter.   Current Outpatient Medications  Medication Sig Dispense Refill Last Dose   benazepril (LOTENSIN) 20 MG tablet Take 1 tablet (20 mg total) by mouth daily. 90 tablet 4    celecoxib (CELEBREX) 200 MG capsule Take 1 capsule (200 mg total) by mouth daily. 30 capsule 1    Multiple Vitamins-Minerals (MULTIVITAMIN & MINERAL PO) Take 1 tablet by mouth daily.      Allergies  Allergen Reactions   Dust Mite Extract Itching    Itching eyes    Social History   Tobacco Use   Smoking status: Never   Smokeless tobacco: Never  Substance Use Topics   Alcohol use: Yes    Alcohol/week: 3.0 standard drinks of alcohol    Types: 3 Glasses of wine per week    Family History  Problem Relation Age of Onset   Colon cancer Sister 63   Breast cancer Sister 63   Hypertension Mother    HIV Brother    Kidney disease Sister    Stomach cancer Neg Hx    CAD Neg Hx    Stroke Neg Hx    Diabetes Neg Hx      Review of Systems  Objective:  Physical Exam Vitals reviewed.  Constitutional:      Appearance: Normal appearance. He is  normal weight.  HENT:     Head: Normocephalic and atraumatic.  Eyes:     Extraocular Movements: Extraocular movements intact.     Pupils: Pupils are equal, round, and reactive to light.  Cardiovascular:     Rate and Rhythm: Normal rate and regular rhythm.     Pulses: Normal pulses.  Pulmonary:     Effort: Pulmonary effort is normal.     Breath sounds: Normal breath sounds.  Abdominal:     Palpations: Abdomen is soft.  Musculoskeletal:     Cervical back: Normal range of motion and neck supple.     Right hip: Tenderness and bony tenderness present. Decreased  range of motion. Decreased strength.  Neurological:     Mental Status: He is alert and oriented to person, place, and time.  Psychiatric:        Behavior: Behavior normal.     Vital signs in last 24 hours:    Labs:   Estimated body mass index is 30.24 kg/m as calculated from the following:   Height as of 05/28/22: 6' (1.829 m).   Weight as of 05/28/22: 101.2 kg.   Imaging Review Plain radiographs demonstrate severe degenerative joint disease of the right hip(s). The bone quality appears to be good for age and reported activity level.      Assessment/Plan:  End stage arthritis, right hip(s)  The patient history, physical examination, clinical judgement of the provider and imaging studies are consistent with end stage degenerative joint disease of the right hip(s) and total hip arthroplasty is deemed medically necessary. The treatment options including medical management, injection therapy, arthroscopy and arthroplasty were discussed at length. The risks and benefits of total hip arthroplasty were presented and reviewed. The risks due to aseptic loosening, infection, stiffness, dislocation/subluxation,  thromboembolic complications and other imponderables were discussed.  The patient acknowledged the explanation, agreed to proceed with the plan and consent was signed. Patient is being admitted for inpatient treatment for surgery, pain control, PT, OT, prophylactic antibiotics, VTE prophylaxis, progressive ambulation and ADL's and discharge planning.The patient is planning to be discharged home with home health services

## 2022-05-31 NOTE — Anesthesia Preprocedure Evaluation (Addendum)
Anesthesia Evaluation  Patient identified by MRN, date of birth, ID band Patient awake    Reviewed: Allergy & Precautions, H&P , NPO status , Patient's Chart, lab work & pertinent test results  Airway Mallampati: II  TM Distance: >3 FB Neck ROM: Full    Dental no notable dental hx. (+) Teeth Intact, Dental Advisory Given   Pulmonary neg pulmonary ROS   Pulmonary exam normal breath sounds clear to auscultation       Cardiovascular Exercise Tolerance: Good hypertension, Pt. on medications  Rhythm:Regular Rate:Normal     Neuro/Psych negative neurological ROS  negative psych ROS   GI/Hepatic negative GI ROS, Neg liver ROS,,,  Endo/Other  negative endocrine ROS    Renal/GU negative Renal ROS  negative genitourinary   Musculoskeletal  (+) Arthritis , Osteoarthritis,    Abdominal   Peds  Hematology negative hematology ROS (+)   Anesthesia Other Findings   Reproductive/Obstetrics negative OB ROS                             Anesthesia Physical Anesthesia Plan  ASA: 2  Anesthesia Plan: Spinal   Post-op Pain Management: Tylenol PO (pre-op)*   Induction: Intravenous  PONV Risk Score and Plan: 2 and Propofol infusion, Ondansetron and Dexamethasone  Airway Management Planned: Natural Airway and Simple Face Mask  Additional Equipment:   Intra-op Plan:   Post-operative Plan:   Informed Consent: I have reviewed the patients History and Physical, chart, labs and discussed the procedure including the risks, benefits and alternatives for the proposed anesthesia with the patient or authorized representative who has indicated his/her understanding and acceptance.     Dental advisory given  Plan Discussed with: CRNA  Anesthesia Plan Comments:        Anesthesia Quick Evaluation

## 2022-06-01 ENCOUNTER — Other Ambulatory Visit: Payer: Self-pay

## 2022-06-01 ENCOUNTER — Ambulatory Visit (HOSPITAL_COMMUNITY): Payer: Commercial Managed Care - PPO | Admitting: Anesthesiology

## 2022-06-01 ENCOUNTER — Encounter (HOSPITAL_COMMUNITY): Payer: Self-pay | Admitting: Orthopaedic Surgery

## 2022-06-01 ENCOUNTER — Observation Stay (HOSPITAL_COMMUNITY)
Admission: RE | Admit: 2022-06-01 | Discharge: 2022-06-03 | Disposition: A | Discharge status: Home / Self Care | Payer: Commercial Managed Care - PPO | Attending: Orthopaedic Surgery | Admitting: Orthopaedic Surgery

## 2022-06-01 ENCOUNTER — Ambulatory Visit (HOSPITAL_COMMUNITY): Payer: Commercial Managed Care - PPO

## 2022-06-01 ENCOUNTER — Observation Stay (HOSPITAL_COMMUNITY): Payer: Commercial Managed Care - PPO

## 2022-06-01 ENCOUNTER — Encounter (HOSPITAL_COMMUNITY)
Admission: RE | Disposition: A | Discharge status: Home / Self Care | Payer: Self-pay | Source: Home / Self Care | Attending: Orthopaedic Surgery

## 2022-06-01 ENCOUNTER — Ambulatory Visit (HOSPITAL_BASED_OUTPATIENT_CLINIC_OR_DEPARTMENT_OTHER): Payer: Commercial Managed Care - PPO | Admitting: Anesthesiology

## 2022-06-01 DIAGNOSIS — R7303 Prediabetes: Secondary | ICD-10-CM

## 2022-06-01 DIAGNOSIS — Z96641 Presence of right artificial hip joint: Secondary | ICD-10-CM

## 2022-06-01 DIAGNOSIS — M1611 Unilateral primary osteoarthritis, right hip: Secondary | ICD-10-CM | POA: Diagnosis not present

## 2022-06-01 DIAGNOSIS — Z79899 Other long term (current) drug therapy: Secondary | ICD-10-CM | POA: Insufficient documentation

## 2022-06-01 DIAGNOSIS — I10 Essential (primary) hypertension: Secondary | ICD-10-CM | POA: Insufficient documentation

## 2022-06-01 DIAGNOSIS — Z8616 Personal history of COVID-19: Secondary | ICD-10-CM | POA: Diagnosis not present

## 2022-06-01 HISTORY — PX: TOTAL HIP ARTHROPLASTY: SHX124

## 2022-06-01 LAB — TYPE AND SCREEN
ABO/RH(D): O POS
Antibody Screen: NEGATIVE

## 2022-06-01 LAB — ABO/RH: ABO/RH(D): O POS

## 2022-06-01 SURGERY — ARTHROPLASTY, HIP, TOTAL, ANTERIOR APPROACH
Anesthesia: Spinal | Site: Hip | Laterality: Right

## 2022-06-01 MED ORDER — METHOCARBAMOL 500 MG PO TABS
500.0000 mg | ORAL_TABLET | Freq: Four times a day (QID) | ORAL | Status: DC | PRN
  Administered 2022-06-01 – 2022-06-02 (×3): 500 mg via ORAL
  Filled 2022-06-01 (×3): qty 1

## 2022-06-01 MED ORDER — METHOCARBAMOL 500 MG IVPB - SIMPLE MED
INTRAVENOUS | Status: AC
  Filled 2022-06-01: qty 55

## 2022-06-01 MED ORDER — FENTANYL CITRATE (PF) 100 MCG/2ML IJ SOLN
INTRAMUSCULAR | Status: AC
  Filled 2022-06-01: qty 2

## 2022-06-01 MED ORDER — METHOCARBAMOL 500 MG IVPB - SIMPLE MED
500.0000 mg | Freq: Four times a day (QID) | INTRAVENOUS | Status: DC | PRN
  Administered 2022-06-01: 500 mg via INTRAVENOUS

## 2022-06-01 MED ORDER — PANTOPRAZOLE SODIUM 40 MG PO TBEC
40.0000 mg | DELAYED_RELEASE_TABLET | Freq: Every day | ORAL | Status: DC
  Administered 2022-06-02 – 2022-06-03 (×2): 40 mg via ORAL
  Filled 2022-06-01 (×2): qty 1

## 2022-06-01 MED ORDER — OXYCODONE HCL 5 MG PO TABS
10.0000 mg | ORAL_TABLET | ORAL | Status: DC | PRN
  Administered 2022-06-01 – 2022-06-02 (×6): 10 mg via ORAL

## 2022-06-01 MED ORDER — 0.9 % SODIUM CHLORIDE (POUR BTL) OPTIME
TOPICAL | Status: DC | PRN
  Administered 2022-06-01: 1000 mL

## 2022-06-01 MED ORDER — SODIUM CHLORIDE 0.9 % IV SOLN: INTRAVENOUS | Status: DC

## 2022-06-01 MED ORDER — CHLORHEXIDINE GLUCONATE 0.12 % MT SOLN
15.0000 mL | Freq: Once | OROMUCOSAL | Status: AC
  Administered 2022-06-01: 15 mL via OROMUCOSAL

## 2022-06-01 MED ORDER — MIDAZOLAM HCL 2 MG/2ML IJ SOLN
INTRAMUSCULAR | Status: AC
  Filled 2022-06-01: qty 2

## 2022-06-01 MED ORDER — PROPOFOL 500 MG/50ML IV EMUL
INTRAVENOUS | Status: DC | PRN
  Administered 2022-06-01: 40 ug/kg/min via INTRAVENOUS

## 2022-06-01 MED ORDER — METOCLOPRAMIDE HCL 5 MG/ML IJ SOLN: 5.0000 mg | Freq: Three times a day (TID) | INTRAMUSCULAR | Status: DC | PRN

## 2022-06-01 MED ORDER — ALUM & MAG HYDROXIDE-SIMETH 200-200-20 MG/5ML PO SUSP: 30.0000 mL | ORAL | Status: DC | PRN

## 2022-06-01 MED ORDER — ONDANSETRON HCL 4 MG/2ML IJ SOLN
INTRAMUSCULAR | Status: AC
  Filled 2022-06-01: qty 2

## 2022-06-01 MED ORDER — FENTANYL CITRATE PF 50 MCG/ML IJ SOSY
PREFILLED_SYRINGE | INTRAMUSCULAR | Status: AC
  Filled 2022-06-01: qty 2

## 2022-06-01 MED ORDER — LACTATED RINGERS IV SOLN: INTRAVENOUS | Status: DC

## 2022-06-01 MED ORDER — HYDROMORPHONE HCL 1 MG/ML IJ SOLN: 0.5000 mg | INTRAMUSCULAR | Status: DC | PRN

## 2022-06-01 MED ORDER — OXYCODONE HCL 5 MG PO TABS
5.0000 mg | ORAL_TABLET | ORAL | Status: DC | PRN
  Administered 2022-06-01 – 2022-06-03 (×2): 10 mg via ORAL
  Filled 2022-06-01 (×9): qty 2

## 2022-06-01 MED ORDER — MENTHOL 3 MG MT LOZG: 1.0000 | LOZENGE | OROMUCOSAL | Status: DC | PRN

## 2022-06-01 MED ORDER — PHENYLEPHRINE HCL-NACL 20-0.9 MG/250ML-% IV SOLN
INTRAVENOUS | Status: DC | PRN
  Administered 2022-06-01: 25 ug/min via INTRAVENOUS

## 2022-06-01 MED ORDER — CEFAZOLIN SODIUM-DEXTROSE 2-4 GM/100ML-% IV SOLN
2.0000 g | INTRAVENOUS | Status: AC
  Administered 2022-06-01: 2 g via INTRAVENOUS
  Filled 2022-06-01: qty 100

## 2022-06-01 MED ORDER — ACETAMINOPHEN 500 MG PO TABS
1000.0000 mg | ORAL_TABLET | Freq: Once | ORAL | Status: AC
  Administered 2022-06-01: 1000 mg via ORAL
  Filled 2022-06-01: qty 2

## 2022-06-01 MED ORDER — PROPOFOL 1000 MG/100ML IV EMUL
INTRAVENOUS | Status: AC
  Filled 2022-06-01: qty 100

## 2022-06-01 MED ORDER — OXYCODONE HCL 5 MG PO TABS
ORAL_TABLET | ORAL | Status: AC
  Administered 2022-06-01: 5 mg via ORAL
  Filled 2022-06-01: qty 1

## 2022-06-01 MED ORDER — SODIUM CHLORIDE 0.9 % IR SOLN
Status: DC | PRN
  Administered 2022-06-01: 1000 mL

## 2022-06-01 MED ORDER — MIDAZOLAM HCL 5 MG/5ML IJ SOLN
INTRAMUSCULAR | Status: DC | PRN
  Administered 2022-06-01: 2 mg via INTRAVENOUS

## 2022-06-01 MED ORDER — CEFAZOLIN SODIUM-DEXTROSE 1-4 GM/50ML-% IV SOLN
1.0000 g | Freq: Four times a day (QID) | INTRAVENOUS | Status: AC
  Administered 2022-06-01 (×2): 1 g via INTRAVENOUS
  Filled 2022-06-01 (×2): qty 50

## 2022-06-01 MED ORDER — ACETAMINOPHEN 325 MG PO TABS: 325.0000 mg | ORAL_TABLET | Freq: Four times a day (QID) | ORAL | Status: DC | PRN

## 2022-06-01 MED ORDER — DEXAMETHASONE SODIUM PHOSPHATE 10 MG/ML IJ SOLN
INTRAMUSCULAR | Status: AC
  Filled 2022-06-01: qty 1

## 2022-06-01 MED ORDER — FENTANYL CITRATE (PF) 100 MCG/2ML IJ SOLN
INTRAMUSCULAR | Status: DC | PRN
  Administered 2022-06-01: 50 ug via INTRAVENOUS

## 2022-06-01 MED ORDER — METOCLOPRAMIDE HCL 5 MG PO TABS: 5.0000 mg | ORAL_TABLET | Freq: Three times a day (TID) | ORAL | Status: DC | PRN

## 2022-06-01 MED ORDER — DIPHENHYDRAMINE HCL 12.5 MG/5ML PO ELIX: 12.5000 mg | ORAL_SOLUTION | ORAL | Status: DC | PRN

## 2022-06-01 MED ORDER — DEXAMETHASONE SODIUM PHOSPHATE 10 MG/ML IJ SOLN
INTRAMUSCULAR | Status: DC | PRN
  Administered 2022-06-01: 8 mg via INTRAVENOUS

## 2022-06-01 MED ORDER — ORAL CARE MOUTH RINSE: 15.0000 mL | Freq: Once | OROMUCOSAL | Status: AC

## 2022-06-01 MED ORDER — FENTANYL CITRATE PF 50 MCG/ML IJ SOSY
25.0000 ug | PREFILLED_SYRINGE | INTRAMUSCULAR | Status: DC | PRN
  Administered 2022-06-01: 50 ug via INTRAVENOUS

## 2022-06-01 MED ORDER — TRANEXAMIC ACID-NACL 1000-0.7 MG/100ML-% IV SOLN
1000.0000 mg | INTRAVENOUS | Status: AC
  Administered 2022-06-01: 1000 mg via INTRAVENOUS
  Filled 2022-06-01: qty 100

## 2022-06-01 MED ORDER — POVIDONE-IODINE 10 % EX SWAB: 2.0000 | Freq: Once | CUTANEOUS | Status: DC

## 2022-06-01 MED ORDER — DOCUSATE SODIUM 100 MG PO CAPS
100.0000 mg | ORAL_CAPSULE | Freq: Two times a day (BID) | ORAL | Status: DC
  Administered 2022-06-01 – 2022-06-03 (×4): 100 mg via ORAL
  Filled 2022-06-01 (×4): qty 1

## 2022-06-01 MED ORDER — ONDANSETRON HCL 4 MG PO TABS: 4.0000 mg | ORAL_TABLET | Freq: Four times a day (QID) | ORAL | Status: DC | PRN

## 2022-06-01 MED ORDER — BENAZEPRIL HCL 20 MG PO TABS
20.0000 mg | ORAL_TABLET | Freq: Every day | ORAL | Status: DC
  Administered 2022-06-01 – 2022-06-03 (×3): 20 mg via ORAL
  Filled 2022-06-01 (×3): qty 1

## 2022-06-01 MED ORDER — PHENOL 1.4 % MT LIQD: 1.0000 | OROMUCOSAL | Status: DC | PRN

## 2022-06-01 MED ORDER — ONDANSETRON HCL 4 MG/2ML IJ SOLN: 4.0000 mg | Freq: Four times a day (QID) | INTRAMUSCULAR | Status: DC | PRN

## 2022-06-01 MED ORDER — ASPIRIN 81 MG PO CHEW
81.0000 mg | CHEWABLE_TABLET | Freq: Two times a day (BID) | ORAL | Status: DC
  Administered 2022-06-01 – 2022-06-03 (×4): 81 mg via ORAL
  Filled 2022-06-01 (×4): qty 1

## 2022-06-01 MED ORDER — ONDANSETRON HCL 4 MG/2ML IJ SOLN
INTRAMUSCULAR | Status: DC | PRN
  Administered 2022-06-01: 4 mg via INTRAVENOUS

## 2022-06-01 MED ORDER — BUPIVACAINE IN DEXTROSE 0.75-8.25 % IT SOLN
INTRATHECAL | Status: DC | PRN
  Administered 2022-06-01: 1.6 mL via INTRATHECAL

## 2022-06-01 SURGICAL SUPPLY — 45 items
APL SKNCLS STERI-STRIP NONHPOA (GAUZE/BANDAGES/DRESSINGS)
BAG COUNTER SPONGE SURGICOUNT (BAG) ×1 IMPLANT
BAG SPEC THK2 15X12 ZIP CLS (MISCELLANEOUS) ×1
BAG SPNG CNTER NS LX DISP (BAG) ×1
BAG ZIPLOCK 12X15 (MISCELLANEOUS) IMPLANT
BENZOIN TINCTURE PRP APPL 2/3 (GAUZE/BANDAGES/DRESSINGS) IMPLANT
BLADE SAW SGTL 18X1.27X75 (BLADE) ×1 IMPLANT
COVER PERINEAL POST (MISCELLANEOUS) ×1 IMPLANT
COVER SURGICAL LIGHT HANDLE (MISCELLANEOUS) ×1 IMPLANT
CUP ACET PINNACLE SECTR 60MM (Hips) IMPLANT
DRAPE FOOT SWITCH (DRAPES) ×1 IMPLANT
DRAPE STERI IOBAN 125X83 (DRAPES) ×1 IMPLANT
DRAPE U-SHAPE 47X51 STRL (DRAPES) ×2 IMPLANT
DRESSING AQUACEL AG SP 3.5X10 (GAUZE/BANDAGES/DRESSINGS) IMPLANT
DRSG AQUACEL AG ADV 3.5X10 (GAUZE/BANDAGES/DRESSINGS) ×1 IMPLANT
DRSG AQUACEL AG SP 3.5X10 (GAUZE/BANDAGES/DRESSINGS) ×1
DRSG CURAD 3X16 NADH (PACKING) IMPLANT
DURAPREP 26ML APPLICATOR (WOUND CARE) ×1 IMPLANT
ELECT REM PT RETURN 15FT ADLT (MISCELLANEOUS) ×1 IMPLANT
GAUZE XEROFORM 1X8 LF (GAUZE/BANDAGES/DRESSINGS) ×1 IMPLANT
GLOVE BIO SURGEON STRL SZ7.5 (GLOVE) ×1 IMPLANT
GLOVE BIOGEL PI IND STRL 8 (GLOVE) ×2 IMPLANT
GLOVE ECLIPSE 8.0 STRL XLNG CF (GLOVE) ×1 IMPLANT
GOWN STRL REUS W/ TWL XL LVL3 (GOWN DISPOSABLE) ×2 IMPLANT
GOWN STRL REUS W/TWL XL LVL3 (GOWN DISPOSABLE) ×2
HANDPIECE INTERPULSE COAX TIP (DISPOSABLE) ×1
HEAD CERAMIC 36 PLUS5 (Hips) IMPLANT
HOLDER FOLEY CATH W/STRAP (MISCELLANEOUS) ×1 IMPLANT
KIT TURNOVER KIT A (KITS) IMPLANT
LINER NEUTRAL 58X36MM PLUS4 IMPLANT
PACK ANTERIOR HIP CUSTOM (KITS) ×1 IMPLANT
PINNSECTOR W/GRIP ACE CUP 60MM (Hips) ×1 IMPLANT
SET HNDPC FAN SPRY TIP SCT (DISPOSABLE) ×1 IMPLANT
STAPLER VISISTAT 35W (STAPLE) IMPLANT
STEM FEM ACTIS HIGH SZ7 (Stem) IMPLANT
STRIP CLOSURE SKIN 1/2X4 (GAUZE/BANDAGES/DRESSINGS) IMPLANT
SUT ETHIBOND NAB CT1 #1 30IN (SUTURE) ×1 IMPLANT
SUT ETHILON 2 0 PS N (SUTURE) IMPLANT
SUT MNCRL AB 4-0 PS2 18 (SUTURE) IMPLANT
SUT VIC AB 0 CT1 36 (SUTURE) ×1 IMPLANT
SUT VIC AB 1 CT1 36 (SUTURE) ×1 IMPLANT
SUT VIC AB 2-0 CT1 27 (SUTURE) ×2
SUT VIC AB 2-0 CT1 TAPERPNT 27 (SUTURE) ×2 IMPLANT
TRAY FOLEY MTR SLVR 16FR STAT (SET/KITS/TRAYS/PACK) IMPLANT
YANKAUER SUCT BULB TIP NO VENT (SUCTIONS) ×1 IMPLANT

## 2022-06-01 NOTE — Anesthesia Procedure Notes (Signed)
Procedure Name: MAC Date/Time: 06/01/2022 7:17 AM  Performed by: Elisabeth Cara, CRNAPre-anesthesia Checklist: Patient identified, Emergency Drugs available, Suction available, Patient being monitored and Timeout performed Patient Re-evaluated:Patient Re-evaluated prior to induction Oxygen Delivery Method: Simple face mask Placement Confirmation: positive ETCO2 Dental Injury: Teeth and Oropharynx as per pre-operative assessment

## 2022-06-01 NOTE — Anesthesia Postprocedure Evaluation (Signed)
Anesthesia Post Note  Patient: Andre [name]  Procedure(s) Performed: RIGHT TOTAL HIP ARTHROPLASTY ANTERIOR APPROACH (Right: Hip)     Patient location during evaluation: PACU Anesthesia Type: Spinal Level of consciousness: oriented and awake and alert Pain management: pain level controlled Vital Signs Assessment: post-procedure vital signs reviewed and stable Respiratory status: spontaneous breathing and respiratory function stable Cardiovascular status: blood pressure returned to baseline and stable Postop Assessment: no headache, no backache, no apparent nausea or vomiting, spinal receding and patient able to bend at knees Anesthetic complications: no  No notable events documented.  Last Vitals:  Vitals:   06/01/22 1145 06/01/22 1200  BP: 126/79 129/84  Pulse: 66 70  Resp: 14 18  Temp:    SpO2: 96% 97%    Last Pain:  Vitals:   06/01/22 1145  TempSrc:   PainSc: 1                  Ruthanne Mcneish,W. EDMOND

## 2022-06-01 NOTE — Transfer of Care (Signed)
Immediate Anesthesia Transfer of Care Note  Patient: Andre [name]  Procedure(s) Performed: RIGHT TOTAL HIP ARTHROPLASTY ANTERIOR APPROACH (Right: Hip)  Patient Location: PACU  Anesthesia Type:MAC and Spinal  Level of Consciousness: awake, alert , oriented, and patient cooperative  Airway & Oxygen Therapy: Patient Spontanous Breathing and Patient connected to face mask oxygen  Post-op Assessment: Report given to RN and Post -op Vital signs reviewed and stable  Post vital signs: Reviewed and stable  Last Vitals:  Vitals Value Taken Time  BP 109/75 06/01/22 0847  Temp    Pulse 78 06/01/22 0848  Resp 16 06/01/22 0848  SpO2 100 % 06/01/22 0848  Vitals shown include unvalidated device data.  Last Pain:  Vitals:   06/01/22 0548  TempSrc:   PainSc: 0-No pain         Complications: No notable events documented.

## 2022-06-01 NOTE — TOC Transition Note (Signed)
Transition of Care [facility]) - CM/SW Discharge Note  Patient Details  Name: Andre [name] MRN: [id number] Date of Birth: 1955/11/14  Transition of Care [facility] CM/SW Contact:  Ewing Schlein, LCSW Phone Number: 06/01/2022, 1:51 PM  Clinical Narrative: Patient is expected to discharge home after working with PT. CSW met with patient and family to confirm discharge plan and needs. Patient will go home with HHPT, which was prearranged with [facility]. Patient will need a rolling walker. CSW made DME referral to [facility] with [employer]. [employer] to deliver rolling walker to patient's room. TOC signing off.    Final next level of care: Home w Home Health Services Barriers to Discharge: No Barriers Identified  Patient Goals and CMS Choice CMS Medicare.gov Compare Post Acute Care list provided to:: Patient Choice offered to / list presented to : Patient  Discharge Plan and Services Additional resources added to the After Visit Summary for       DME Arranged: Walker rolling DME Agency: [employer] Date DME Agency Contacted: 06/01/22 Representative spoke with at DME Agency: Vaughan Basta HH Arranged: PT HH Agency: [facility] Representative spoke with at [facility] Agency: Prearranged in orthopedist's office  Social Determinants of Health (SDOH) Interventions SDOH Screenings   Food Insecurity: No Food Insecurity (06/01/2022)  Housing: Low Risk  (06/01/2022)  Transportation Needs: No Transportation Needs (06/01/2022)  Utilities: Not At Risk (06/01/2022)  Depression (PHQ2-9): Low Risk  (01/30/2022)  Tobacco Use: Low Risk  (06/01/2022)   Readmission Risk Interventions     No data to display

## 2022-06-01 NOTE — Anesthesia Procedure Notes (Signed)
Spinal  Patient location during procedure: OR Start time: 06/01/2022 7:18 AM End time: 06/01/2022 7:22 AM Reason for block: surgical anesthesia Staffing Performed: anesthesiologist  Anesthesiologist: Gaynelle Adu, MD Performed by: Gaynelle Adu, MD Authorized by: Gaynelle Adu, MD   Preanesthetic Checklist Completed: patient identified, IV checked, risks and benefits discussed, surgical consent, monitors and equipment checked, pre-op evaluation and timeout performed Spinal Block Patient position: sitting Prep: DuraPrep Patient monitoring: cardiac monitor, continuous pulse ox and blood pressure Approach: midline Location: L3-4 Injection technique: single-shot Needle Needle type: Pencan  Needle gauge: 24 G Needle length: 9 cm Assessment Sensory level: T8 Events: CSF return Additional Notes Functioning IV was confirmed and monitors were applied. Sterile prep and drape, including hand hygiene and sterile gloves were used. The patient was positioned and the spine was prepped. The skin was anesthetized with lidocaine.  Free flow of clear CSF was obtained prior to injecting local anesthetic into the CSF.  The spinal needle aspirated freely following injection.  The needle was carefully withdrawn.  The patient tolerated the procedure well.

## 2022-06-01 NOTE — Interval H&P Note (Signed)
History and Physical Interval Note: The patient understands that he is here today for a right hip replacement to treat his severe right hip arthritis.  There has been no acute or interval change in his medical status.  See H&P.  The risks and benefits of surgery been discussed in detail and informed consent was obtained.  The right operative hip has been marked.  06/01/2022 6:50 AM  Andre [name]  has presented today for surgery, with the diagnosis of osteoarthritis right hip.  The various methods of treatment have been discussed with the patient and family. After consideration of risks, benefits and other options for treatment, the patient has consented to  Procedure(s): RIGHT TOTAL HIP ARTHROPLASTY ANTERIOR APPROACH (Right) as a surgical intervention.  The patient's history has been reviewed, patient examined, no change in status, stable for surgery.  I have reviewed the patient's chart and labs.  Questions were answered to the patient's satisfaction.     Kathryne Hitch

## 2022-06-01 NOTE — Op Note (Signed)
Operative Note  Date of operation: 06/01/2022 Preoperative diagnosis: Right hip primary osteoarthritis Postoperative diagnosis: Same  Procedure: Right direct anterior total hip arthroplasty  Implants: Implant Name Type Inv. Item Serial No. Manufacturer Lot No. LRB No. Used Action  PINNSECTOR W/GRIP ACE CUP - [id number] Hips PINNSECTOR W/GRIP ACE CUP  [employer] [id number] Right 1 Implanted  LINER NEUTRAL 58X36MM PLUS4 - [id number]  LINER NEUTRAL 58X36MM PLUS4  DEPUY ORTH[employer] [id number] Right 1 Implanted  STEM FEM ACTIS HIGH SZ7 - [id number] Stem STEM FEM ACTIS HIGH SZ7  [employer]PU[employer] [id number] Right 1 Implanted  HEAD CERAMIC 36 PLUS5 - [id number] Hips HEAD CERAMIC 36 PLUS5  [employer]TH[employer] [id number] Right 1 Implanted   Surgeon: Vanita Panda. Magnus Ivan, MD Assistant: Rexene Edison, PA-C  Anesthesia: Spinal EBL: 200 cc Antibiotics: IV Ancef Complications: None  Indications: The patient is a 67 year old gentleman with debilitating arthritis involving his right hip.  This is been well-documented to clinic visits from looking at his clinical exam findings and x-ray findings.  At this point his right hip pain is daily and it is detrimentally affecting his mobility, his quality of life and his actives of daily living to the point he wishes proceed with a right total hip arthroplasty.  He has tried and failed all forms conservative treatment.  We did discuss the risk of acute blood loss anemia, nerve or vessel injury, fracture, infection, DVT, implant failure, dislocation, leg length differences and wound healing issues.  He understands her goals are hopefully decrease pain, improve mobility and overall proved quality of life.  Procedure description: After informed consent was obtained and the appropriate right hip was marked, the patient was brought to the operating room and sat up on the stretcher where spinal anesthesia was obtained.  He was then laid in supine position on  stretcher.  A Foley catheter was placed.  Traction boots were placed on both his feet and he was placed supine on the Hana fracture table with a perineal post in place in both legs and inline skeletal traction devices but no traction applied.  His right operative hip and pelvis were then assessed radiographically.  The right hip was then prepped and draped with DuraPrep and sterile drapes.  A timeout was called and he was identified as the correct patient the correct right hip.  An incision was then made just inferior and posterior to the ASIS and carried slightly obliquely down the leg.  Dissection was carried down to the tensor fascia lata muscle and the tensor fascia was then divided longitudinally to proceed with a direct interposed the hip.  Circumflex vessels were identified and cauterized and hip capsule was identified and opened up in L-type format.  Cobra retractors were placed around the medial and lateral femoral neck and a femoral neck cut was made with an oscillating saw just proximal to the lesser trochanter and this was completed with an osteotome.  A corkscrew guide was placed in the femoral head and the femoral head was removed in its entirety and there was a wide area devoid of cartilage.  A bent Hohmann was then placed over the medial acetabular rim and remnants of the acetabular labrum and other debris removed.  We then began reaming sequentially from a size 43 reamer and stepwise increments going up to a size 59 reamer with all reamers placed under direct visualization and the last reamer was placed under direct fluoroscopy in order to obtain the depth of reaming, the inclination and anteversion.  We then went with the real DePuy sector GRIPTION acetabular component size 60 and a 36+4 polythene liner based off medialization of the cup and the patient's offset.  Attention was then turned to the femur.  With the right leg externally rotated to 120 degrees, extended and adducted, a Mueller  retractor was placed medially and a Hohmann retractor was placed behind the greater trochanter.  The lateral joint capsule was released and a box cutting osteotome was used into the femoral canal.  Broaching was then initiated using the Actis broaching system from a size 0 going all the way to a size 7.  With a size 7 in place we trialed a high offset femoral neck and a 36+1.5 hip ball.  We brought the leg back over and up and with traction and internal rotation reduced in the pelvis.  Based on radiographic and clinical assessment we felt like we needed more leg length.  We dislocated the hip and remove the trial components.  We placed the real Actis femoral component with high offset size 7 and the real 36+5 ceramic hip bone again reduces in acetabulum and we are pleased with leg length, offset, range of motion and stability.  This was assessed again radiographically and mechanically.  The soft tissue was then irrigated with normal saline solution.  The joint capsule remnants were closed with interrupted #1 Ethibond suture followed by #1 Vicryl to close the tensor fascia.  0 Vicryl is used to close the deep tissue and 2-0 Vicryl is used closed the subcutaneous tissue.  The skin was closed with staples.  Aquacel dressing was applied.  The patient was taken off on table taken recovery room in stable addition.  Rexene Edison, PA-C did assist during the entire case and beginning to end and his assistance was medically necessary and crucial for soft tissue retraction and management, helping guide implant placement and a layered closure of the wound.

## 2022-06-01 NOTE — Evaluation (Signed)
Physical Therapy Evaluation Patient Details Name: Andre [name] MRN: [id number] DOB: 1956/01/29 Today's Date: 06/01/2022  History of Present Illness  Pt s/p R THR and with hx of kidney stones  Clinical Impression  Pt s/p R THR and presents with decreased R LE strength/ROM and post op pain limiting functional mobility. This date, pt requiring increase time for all tasks and became diaphoretic with increased time in standing limiting session to transfer to chair. Pt motivated and should progress to dc home with family assist.     Recommendations for follow up therapy are one component of a multi-disciplinary discharge planning process, led by the attending physician.  Recommendations may be updated based on patient status, additional functional criteria and insurance authorization.  Follow Up Recommendations       Assistance Recommended at Discharge Intermittent Supervision/Assistance  Patient can return home with the following       Equipment Recommendations Rolling walker (2 wheels) (has been delivered to room)  Recommendations for Other Services       Functional Status Assessment       Precautions / Restrictions Precautions Precautions: Fall Restrictions Weight Bearing Restrictions: No Other Position/Activity Restrictions: WBAT      Mobility  Bed Mobility Overal bed mobility: Needs Assistance Bed Mobility: Supine to Sit     Supine to sit: Min assist     General bed mobility comments: INcreased time with cues for sequence and use of L LE to self assist    Transfers Overall transfer level: Needs assistance Equipment used: Rolling walker (2 wheels) Transfers: Sit to/from Stand, Bed to chair/wheelchair/BSC Sit to Stand: Min assist, From elevated surface   Step pivot transfers: Min assist       General transfer comment: cues for LE management and use of UEs to self assist; step pvt bed to recliner    Ambulation/Gait               General Gait Details:  Pt limited to step pvt bed to chair 2* becoming diaphoretic with increased time in standing.  Pt tolerating min WB on R LE and pain limiting ability to advance R LE  Stairs            Wheelchair Mobility    Modified Rankin (Stroke Patients Only)       Balance Overall balance assessment: Needs assistance Sitting-balance support: No upper extremity supported, Feet supported Sitting balance-Leahy Scale: Good     Standing balance support: Bilateral upper extremity supported Standing balance-Leahy Scale: Poor                               Pertinent Vitals/Pain Pain Assessment Pain Assessment: 0-10 Pain Score: 5  Pain Location: R hip Pain Descriptors / Indicators: Aching, Grimacing, Guarding, Sore Pain Intervention(s): Limited activity within patient's tolerance, Monitored during session, Premedicated before session, Ice applied    Home Living Family/patient expects to be discharged to:: Private residence Living Arrangements: Spouse/significant other Available Help at Discharge: Family;Available 24 hours/day Type of Home: House Home Access: Stairs to enter Entrance Stairs-Rails: None Entrance Stairs-Number of Steps: 2 Alternate Level Stairs-Number of Steps: flight Home Layout: Two level Home Equipment: None      Prior Function Prior Level of Function : Independent/Modified Independent                     Hand Dominance        Extremity/Trunk Assessment   Upper Extremity  Assessment Upper Extremity Assessment: Overall WFL for tasks assessed    Lower Extremity Assessment Lower Extremity Assessment: RLE deficits/detail    Cervical / Trunk Assessment Cervical / Trunk Assessment: Normal  Communication   Communication: No difficulties  Cognition Arousal/Alertness: Awake/alert Behavior During Therapy: WFL for tasks assessed/performed Overall Cognitive Status: Within Functional Limits for tasks assessed                                           General Comments      Exercises Total Joint Exercises Ankle Circles/Pumps: AROM, Both, 15 reps, Supine   Assessment/Plan    PT Assessment Patient needs continued PT services  PT Problem List Decreased strength;Decreased range of motion;Decreased activity tolerance;Decreased balance;Decreased mobility;Decreased knowledge of use of DME;Pain       PT Treatment Interventions DME instruction;Gait training;Stair training;Functional mobility training;Therapeutic activities;Therapeutic exercise;Patient/family education    PT Goals (Current goals can be found in the Care Plan section)  Acute Rehab PT Goals Patient Stated Goal: Regain IND PT Goal Formulation: With patient Time For Goal Achievement: 06/08/22 Potential to Achieve Goals: Good    Frequency 7X/week     Co-evaluation               AM-PAC PT "6 Clicks" Mobility  Outcome Measure Help needed turning from your back to your side while in a flat bed without using bedrails?: A Little Help needed moving from lying on your back to sitting on the side of a flat bed without using bedrails?: A Little Help needed moving to and from a bed to a chair (including a wheelchair)?: A Little Help needed standing up from a chair using your arms (e.g., wheelchair or bedside chair)?: A Little Help needed to walk in hospital room?: Total Help needed climbing 3-5 steps with a railing? : Total 6 Click Score: 14    End of Session Equipment Utilized During Treatment: Gait belt Activity Tolerance: Patient tolerated treatment well Patient left: in chair;with call bell/phone within reach Nurse Communication: Mobility status PT Visit Diagnosis: Unsteadiness on feet (R26.81);Difficulty in walking, not elsewhere classified (R26.2)    Time: 1610-9604 PT Time Calculation (min) (ACUTE ONLY): 31 min   Charges:   PT Evaluation $PT Eval Low Complexity: 1 Low PT Treatments $Therapeutic Activity: 8-22 mins        Mauro Kaufmann PT Acute Rehabilitation Services Pager [phone] Office [phone]   [facility] 06/01/2022, 4:57 PM

## 2022-06-01 NOTE — Plan of Care (Signed)
  Problem: Education: Goal: Knowledge of the prescribed therapeutic regimen will improve Outcome: Progressing   Problem: Activity: Goal: Ability to tolerate increased activity will improve Outcome: Progressing   Problem: Pain Management: Goal: Pain level will decrease with appropriate interventions Outcome: Progressing   Problem: Safety: Goal: Ability to remain free from injury will improve Outcome: Progressing   

## 2022-06-02 DIAGNOSIS — M1611 Unilateral primary osteoarthritis, right hip: Secondary | ICD-10-CM | POA: Diagnosis not present

## 2022-06-02 LAB — CBC
HCT: 38.6 % — ABNORMAL LOW (ref 39.0–52.0)
Hemoglobin: 12.9 g/dL — ABNORMAL LOW (ref 13.0–17.0)
MCH: 28.2 pg (ref 26.0–34.0)
MCHC: 33.4 g/dL (ref 30.0–36.0)
MCV: 84.5 fL (ref 80.0–100.0)
Platelets: 182 10*3/uL (ref 150–400)
RBC: 4.57 MIL/uL (ref 4.22–5.81)
RDW: 13.3 % (ref 11.5–15.5)
WBC: 11.6 10*3/uL — ABNORMAL HIGH (ref 4.0–10.5)
nRBC: 0 % (ref 0.0–0.2)

## 2022-06-02 LAB — BASIC METABOLIC PANEL
Anion gap: 6 (ref 5–15)
BUN: 15 mg/dL (ref 8–23)
CO2: 28 mmol/L (ref 22–32)
Calcium: 8.5 mg/dL — ABNORMAL LOW (ref 8.9–10.3)
Chloride: 99 mmol/L (ref 98–111)
Creatinine, Ser: 0.87 mg/dL (ref 0.61–1.24)
GFR, Estimated: >60 mL/min (ref >60)
Glucose, Bld: 148 mg/dL — ABNORMAL HIGH (ref 70–99)
Potassium: 4.1 mmol/L (ref 3.5–5.1)
Sodium: 133 mmol/L — ABNORMAL LOW (ref 135–145)

## 2022-06-02 MED ORDER — ASPIRIN 81 MG PO CHEW: 81.0000 mg | CHEWABLE_TABLET | Freq: Two times a day (BID) | ORAL | 0 refills | Status: DC

## 2022-06-02 MED ORDER — OXYCODONE HCL 5 MG PO TABS: 5.0000 mg | ORAL_TABLET | Freq: Four times a day (QID) | ORAL | 0 refills | Status: DC | PRN

## 2022-06-02 NOTE — Progress Notes (Signed)
Subjective: 1 Day Post-Op Procedure(s) (LRB): RIGHT TOTAL HIP ARTHROPLASTY ANTERIOR APPROACH (Right) Patient reports pain as moderate.  He reports that he did not get any sleep last night.  He had a slow session with physical therapy yesterday as well.  He would like to stay 1 more night to maximize physical therapy and pain control.  Objective: Vital signs in last 24 hours: Temp:  [97.4 F (36.3 C)-100.1 F (37.8 C)] 97.6 F (36.4 C) (05/04 1030) Pulse Rate:  [66-109] 100 (05/04 1030) Resp:  [14-21] 17 (05/04 1030) BP: (113-163)/(69-96) 138/85 (05/04 1030) SpO2:  [96 %-100 %] 96 % (05/04 1030)  Intake/Output from previous day: 05/03 0701 - 05/04 0700 In: 6473.1 [P.O.:1920; I.V.:4248.1; IV Piggyback:305] Out: 4500 [Urine:4300; Blood:200] Intake/Output this shift: Total I/O In: 240 [P.O.:240] Out: 150 [Urine:150]  Recent Labs    06/02/22 0445  HGB 12.9*   Recent Labs    06/02/22 0445  WBC 11.6*  RBC 4.57  HCT 38.6*  PLT 182   Recent Labs    06/02/22 0445  NA 133*  K 4.1  CL 99  CO2 28  BUN 15  CREATININE 0.87  GLUCOSE 148*  CALCIUM 8.5*   No results for input(s): "LABPT", "INR" in the last 72 hours.  Sensation intact distally Intact pulses distally Dorsiflexion/Plantar flexion intact Incision: dressing C/D/I   Assessment/Plan: 1 Day Post-Op Procedure(s) (LRB): RIGHT TOTAL HIP ARTHROPLASTY ANTERIOR APPROACH (Right) Up with therapy Plan for discharge tomorrow Discharge home with home health      Kathryne Hitch 06/02/2022, 10:33 AM

## 2022-06-02 NOTE — Progress Notes (Signed)
Physical Therapy Treatment Patient Details Name: Andre [name] MRN: [id number] DOB: Aug 16, 1955 Today's Date: 06/02/2022   History of Present Illness Pt s/p R THR and with hx of kidney stones    PT Comments    POD # 1 pm session Spouse present during session.  Assisted with amb in hallway an increased distance.  Performed standing THR TE's following HEP handout.  Returned to [facility]  Applied ICE. Pt plans to D/C to home "tomorrow".  Pt will need to practice a flight of stairs (has one rail on LEFT going up).  Crutch vs cane use for stairs TBD  Recommendations for follow up therapy are one component of a multi-disciplinary discharge planning process, led by the attending physician.  Recommendations may be updated based on patient status, additional functional criteria and insurance authorization.  Follow Up Recommendations       Assistance Recommended at Discharge Intermittent Supervision/Assistance  Patient can return home with the following A little help with walking and/or transfers;A little help with bathing/dressing/bathroom;Assistance with cooking/housework;Assist for transportation   Equipment Recommendations  Rolling walker (2 wheels)    Recommendations for Other Services       Precautions / Restrictions Precautions Precautions: Fall Restrictions Weight Bearing Restrictions: No Other Position/Activity Restrictions: WBAT     Mobility  Bed Mobility               General bed mobility comments: OOB in recliner.  "slept here all night" (bad back) "I like my firm bed at home".    Transfers Overall transfer level: Needs assistance Equipment used: Rolling walker (2 wheels) Transfers: Sit to/from Stand, Bed to chair/wheelchair/BSC Sit to Stand: From elevated surface, Supervision, Min guard           General transfer comment: cues for LE management and use of UEs to self assist    Ambulation/Gait Ambulation/Gait assistance: Supervision, Min guard Gait  Distance (Feet): 125 Feet Assistive device: Rolling walker (2 wheels) Gait Pattern/deviations: Step-through pattern, Decreased stride length, Decreased stance time - right Gait velocity: decreased     General Gait Details: "feels good to walk" stated pt.  VC's for safety with turns   Stairs             Wheelchair Mobility    Modified Rankin (Stroke Patients Only)       Balance                                            Cognition Arousal/Alertness: Awake/alert Behavior During Therapy: WFL for tasks assessed/performed Overall Cognitive Status: Within Functional Limits for tasks assessed                                 General Comments: AxO x 3 pleasant and motivated        Exercises  05 reps all standing THR TE's following HEP handout    General Comments        Pertinent Vitals/Pain Pain Assessment Pain Assessment: No/denies pain Pain Score: 4  Pain Location: R hip Pain Descriptors / Indicators: Aching, Grimacing, Guarding, Sore Pain Intervention(s): Monitored during session, Premedicated before session, Repositioned, Ice applied    Home Living                          Prior Function  PT Goals (current goals can now be found in the care plan section) Progress towards PT goals: Progressing toward goals    Frequency    7X/week      PT Plan Current plan remains appropriate    Co-evaluation              AM-PAC PT "6 Clicks" Mobility   Outcome Measure  Help needed turning from your back to your side while in a flat bed without using bedrails?: A Little Help needed moving from lying on your back to sitting on the side of a flat bed without using bedrails?: A Little Help needed moving to and from a bed to a chair (including a wheelchair)?: A Little Help needed standing up from a chair using your arms (e.g., wheelchair or bedside chair)?: A Little Help needed to walk in hospital room?: A  Little Help needed climbing 3-5 steps with a railing? : A Lot 6 Click Score: 17    End of Session Equipment Utilized During Treatment: Gait belt Activity Tolerance: Patient tolerated treatment well Patient left: in chair;with call bell/phone within reach Nurse Communication: Mobility status PT Visit Diagnosis: Unsteadiness on feet (R26.81);Difficulty in walking, not elsewhere classified (R26.2)     Time: 1610-9604 PT Time Calculation (min) (ACUTE ONLY): 20 min  Charges:  $Gait Training: 8-22 mins                      Felecia Shelling  PTA Acute  Rehabilitation Services Office M-F          [phone]

## 2022-06-02 NOTE — Progress Notes (Signed)
Physical Therapy Treatment Patient Details Name: Andre [name] MRN: [id number] DOB: [birth year] Today's Date: 06/02/2022   History of Present Illness Pt s/p R THR and with hx of kidney stones    PT Comments    POD # 1 am session General Comments: AxO x 3 pleasant and motivated.  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 General bed mobility comments: OOB in recliner.  "slept here all night" (bad back) "I like my firm bed at home". Assisted with amb in hallway.  General Gait Details: recliner following as a precaution.  Tolerated a functional distance in hallway.  VC's on proper walker to self distance and safety with turns. Then returned to room to perform some TE's following HEP handout.  Instructed on proper tech, freq as well as use of ICE.   Pt will need another PT session this afternoon.  Pt plans to D/C to home "tomorrow".    Recommendations for follow up therapy are one component of a multi-disciplinary discharge planning process, led by the attending physician.  Recommendations may be updated based on patient status, additional functional criteria and insurance authorization.  Follow Up Recommendations       Assistance Recommended at Discharge Intermittent Supervision/Assistance  Patient can return home with the following A little help with walking and/or transfers;A little help with bathing/dressing/bathroom;Assistance with cooking/housework;Assist for transportation   Equipment Recommendations  Rolling walker (2 wheels)    Recommendations for Other Services       Precautions / Restrictions Precautions Precautions: Fall Restrictions Weight Bearing Restrictions: No Other Position/Activity Restrictions: WBAT     Mobility  Bed Mobility               General bed mobility comments: OOB in recliner.  "slept here all night" (bad back) "I like my firm bed at home".    Transfers Overall transfer level: Needs assistance Equipment used: Rolling walker (2  wheels) Transfers: Sit to/from Stand, Bed to chair/wheelchair/BSC Sit to Stand: From elevated surface, Supervision, Min guard           General transfer comment: cues for LE management and use of UEs to self assist    Ambulation/Gait Ambulation/Gait assistance: Supervision, Min guard Gait Distance (Feet): 85 Feet Assistive device: Rolling walker (2 wheels) Gait Pattern/deviations: Step-through pattern, Decreased stride length, Decreased stance time - right Gait velocity: decreased     General Gait Details: recliner following as a precaution.  Tolerated a functional distance in hallway.  VC's on proper walker to self distance and safety with turns.   Stairs             Wheelchair Mobility    Modified Rankin (Stroke Patients Only)       Balance                                            Cognition Arousal/Alertness: Awake/alert Behavior During Therapy: WFL for tasks assessed/performed Overall Cognitive Status: Within Functional Limits for tasks assessed                                 General Comments: AxO x 3 pleasant and motivated        Exercises  Total Hip Replacement TE's following HEP Handout 10 reps ankle pumps 05 reps knee presses 05 reps heel slides 05 reps SAQ's 05 reps ABD  Instructed how to use a belt loop to assist  Followed by ICE     General Comments        Pertinent Vitals/Pain Pain Assessment Pain Assessment: No/denies pain Pain Score: 4  Pain Location: R hip Pain Descriptors / Indicators: Aching, Grimacing, Guarding, Sore Pain Intervention(s): Monitored during session, Premedicated before session, Repositioned, Ice applied    Home Living                          Prior Function            PT Goals (current goals can now be found in the care plan section) Progress towards PT goals: Progressing toward goals    Frequency    7X/week      PT Plan Current plan remains  appropriate    Co-evaluation              AM-PAC PT "6 Clicks" Mobility   Outcome Measure  Help needed turning from your back to your side while in a flat bed without using bedrails?: A Little Help needed moving from lying on your back to sitting on the side of a flat bed without using bedrails?: A Little Help needed moving to and from a bed to a chair (including a wheelchair)?: A Little Help needed standing up from a chair using your arms (e.g., wheelchair or bedside chair)?: A Little Help needed to walk in hospital room?: A Little Help needed climbing 3-5 steps with a railing? : A Lot 6 Click Score: 17    End of Session Equipment Utilized During Treatment: Gait belt Activity Tolerance: Patient tolerated treatment well Patient left: in chair;with call bell/phone within reach Nurse Communication: Mobility status PT Visit Diagnosis: Unsteadiness on feet (R26.81);Difficulty in walking, not elsewhere classified (R26.2)     Time: 1202-1225 PT Time Calculation (min) (ACUTE ONLY): 23 min  Charges:  $Gait Training: 8-22 mins $Therapeutic Exercise: 8-22 mins                     Felecia Shelling  PTA Acute  Rehabilitation Services Office M-F          [phone]

## 2022-06-02 NOTE — Plan of Care (Signed)

## 2022-06-02 NOTE — Discharge Instructions (Signed)

## 2022-06-03 DIAGNOSIS — M1611 Unilateral primary osteoarthritis, right hip: Secondary | ICD-10-CM | POA: Diagnosis not present

## 2022-06-03 NOTE — Plan of Care (Signed)
Plan of care reviewed and discussed. Problem: Education: Goal: Knowledge of the prescribed therapeutic regimen will improve Outcome: Adequate for Discharge Goal: Understanding of discharge needs will improve Outcome: Progressing Goal: Individualized Educational Video(s) Outcome: Completed/Met   Problem: Activity: Goal: Ability to avoid complications of mobility impairment will improve Outcome: Adequate for Discharge Goal: Ability to tolerate increased activity will improve Outcome: Adequate for Discharge   Problem: Clinical Measurements: Goal: Postoperative complications will be avoided or minimized Outcome: Progressing   Problem: Pain Management: Goal: Pain level will decrease with appropriate interventions Outcome: Progressing   Problem: Skin Integrity: Goal: Will show signs of wound healing Outcome: Progressing   Problem: Education: Goal: Knowledge of General Education information will improve Description: Including pain rating scale, medication(s)/side effects and non-pharmacologic comfort measures Outcome: Progressing   Problem: Health Behavior/Discharge Planning: Goal: Ability to manage health-related needs will improve Outcome: Progressing   Problem: Clinical Measurements: Goal: Ability to maintain clinical measurements within normal limits will improve Outcome: Progressing Goal: Will remain free from infection Outcome: Progressing Goal: Diagnostic test results will improve Outcome: Progressing Goal: Respiratory complications will improve Outcome: Progressing Goal: Cardiovascular complication will be avoided Outcome: Progressing

## 2022-06-03 NOTE — Plan of Care (Signed)
  Problem: Education: Goal: Knowledge of the prescribed therapeutic regimen will improve Outcome: Progressing Goal: Understanding of discharge needs will improve Outcome: Progressing   Problem: Activity: Goal: Ability to avoid complications of mobility impairment will improve Outcome: Progressing Goal: Ability to tolerate increased activity will improve Outcome: Progressing   Problem: Clinical Measurements: Goal: Postoperative complications will be avoided or minimized Outcome: Progressing   Problem: Pain Management: Goal: Pain level will decrease with appropriate interventions Outcome: Progressing   Problem: Skin Integrity: Goal: Will show signs of wound healing Outcome: Progressing   Problem: Education: Goal: Knowledge of General Education information will improve Description: Including pain rating scale, medication(s)/side effects and non-pharmacologic comfort measures Outcome: Progressing   Problem: Health Behavior/Discharge Planning: Goal: Ability to manage health-related needs will improve Outcome: Progressing   Problem: Clinical Measurements: Goal: Ability to maintain clinical measurements within normal limits will improve Outcome: Progressing Goal: Will remain free from infection Outcome: Progressing Goal: Diagnostic test results will improve Outcome: Progressing Goal: Respiratory complications will improve Outcome: Progressing Goal: Cardiovascular complication will be avoided Outcome: Progressing   Problem: Activity: Goal: Risk for activity intolerance will decrease Outcome: Progressing   Problem: Coping: Goal: Level of anxiety will decrease Outcome: Progressing   Problem: Elimination: Goal: Will not experience complications related to bowel motility Outcome: Progressing   Problem: Pain Managment: Goal: General experience of comfort will improve Outcome: Progressing   Problem: Safety: Goal: Ability to remain free from injury will  improve Outcome: Progressing   Problem: Skin Integrity: Goal: Risk for impaired skin integrity will decrease Outcome: Progressing   

## 2022-06-03 NOTE — Progress Notes (Signed)
  Subjective: Andre [name] is a 67 y.o. male s/p right THA.  They are POD2.  Pt's pain is controlled.  Pt has ambulated with some difficulty.  No CP/SOB/calf pain. Ready for dsicharge home  Objective: Vital signs in last 24 hours: Temp:  [97.4 F (36.3 C)-98.4 F (36.9 C)] 97.4 F (36.3 C) (05/05 1321) Pulse Rate:  [95-107] 107 (05/05 1321) Resp:  [18-19] 18 (05/05 1321) BP: (133-138)/(78-88) 133/80 (05/05 1321) SpO2:  [97 %-99 %] 99 % (05/05 1321)  Intake/Output from previous day: 05/04 0701 - 05/05 0700 In: 720 [P.O.:720] Out: 1200 [Urine:1200] Intake/Output this shift: Total I/O In: 240 [P.O.:240] Out: -   Exam:  No gross blood or drainage overlying the dressing Right foot warm and well-perfused Sensation intact distally in the right foot Able to dorsiflex and plantarflex the right foot   Labs: Recent Labs    06/02/22 0445  HGB 12.9*   Recent Labs    06/02/22 0445  WBC 11.6*  RBC 4.57  HCT 38.6*  PLT 182   Recent Labs    06/02/22 0445  NA 133*  K 4.1  CL 99  CO2 28  BUN 15  CREATININE 0.87  GLUCOSE 148*  CALCIUM 8.5*   No results for input(s): "LABPT", "INR" in the last 72 hours.  Assessment/Plan: Pt is POD2 s/p right THA.    -Plan to discharge to home today   -WBAT with a walker  -Follow-up with [name] or Rexene Edison PA-C in 2 weeks   [facility] 06/03/2022, 1:52 PM

## 2022-06-03 NOTE — Progress Notes (Signed)
Physical Therapy Treatment Patient Details Name: Andre [name] MRN: [id number] DOB: August 31, 1955 Today's Date: 06/03/2022   History of Present Illness Pt s/p R THR and with hx of kidney stones    PT Comments    Pt in good spirits and progressing well with mobility.  Pt performed therex program, up to ambulate increased distance in hall, negotiated stairs, reviewed bed mobility and reviewed car transfers.  Pt eager for dc home this date.   Recommendations for follow up therapy are one component of a multi-disciplinary discharge planning process, led by the attending physician.  Recommendations may be updated based on patient status, additional functional criteria and insurance authorization.  Follow Up Recommendations       Assistance Recommended at Discharge Intermittent Supervision/Assistance  Patient can return home with the following A little help with walking and/or transfers;A little help with bathing/dressing/bathroom;Assistance with cooking/housework;Assist for transportation   Equipment Recommendations  Rolling walker (2 wheels)    Recommendations for Other Services       Precautions / Restrictions Precautions Precautions: Fall Restrictions Weight Bearing Restrictions: No Other Position/Activity Restrictions: WBAT     Mobility  Bed Mobility Overal bed mobility: Needs Assistance Bed Mobility: Sit to Supine       Sit to supine: Min guard   General bed mobility comments: cues for sequence and use of L LE and gait belt to self assist    Transfers Overall transfer level: Needs assistance Equipment used: Rolling walker (2 wheels) Transfers: Sit to/from Stand Sit to Stand: Supervision           General transfer comment: cues for LE management and use of UEs to self assist    Ambulation/Gait Ambulation/Gait assistance: Supervision, Min guard Gait Distance (Feet): 200 Feet Assistive device: Rolling walker (2 wheels) Gait Pattern/deviations: Decreased  stride length, Decreased stance time - right, Step-to pattern, Step-through pattern, Shuffle, Trunk flexed       General Gait Details: cues for posture and position from RW   Stairs Stairs: Yes Stairs assistance: Min assist Stair Management: No rails, One rail Left, Step to pattern, Forwards, With cane Number of Stairs: 7 General stair comments: 2 stairs with bil rails, 2 stairs with RW at top and using door frame, and 3 stairs with cane and rail.  CUes for sequence and cane placement   Wheelchair Mobility    Modified Rankin (Stroke Patients Only)       Balance Overall balance assessment: Needs assistance Sitting-balance support: No upper extremity supported, Feet supported Sitting balance-Leahy Scale: Good     Standing balance support: No upper extremity supported Standing balance-Leahy Scale: Fair                              Cognition Arousal/Alertness: Awake/alert Behavior During Therapy: WFL for tasks assessed/performed Overall Cognitive Status: Within Functional Limits for tasks assessed                                 General Comments: AxO x 3 pleasant and motivated        Exercises Total Joint Exercises Ankle Circles/Pumps: AROM, Both, 15 reps, Supine Quad Sets: AROM, Both, 10 reps, Supine Heel Slides: AAROM, Right, 20 reps, Supine Hip ABduction/ADduction: AAROM, Right, 15 reps, Supine    General Comments        Pertinent Vitals/Pain Pain Assessment Pain Assessment: 0-10 Pain Score: 3  Pain Location:  R hip Pain Descriptors / Indicators: Aching, Sore Pain Intervention(s): Limited activity within patient's tolerance, Monitored during session, Premedicated before session, Ice applied    Home Living                          Prior Function            PT Goals (current goals can now be found in the care plan section) Acute Rehab PT Goals Patient Stated Goal: Regain IND PT Goal Formulation: With patient Time  For Goal Achievement: 06/08/22 Potential to Achieve Goals: Good Progress towards PT goals: Progressing toward goals    Frequency    7X/week      PT Plan Current plan remains appropriate    Co-evaluation              AM-PAC PT "6 Clicks" Mobility   Outcome Measure  Help needed turning from your back to your side while in a flat bed without using bedrails?: A Little Help needed moving from lying on your back to sitting on the side of a flat bed without using bedrails?: A Little Help needed moving to and from a bed to a chair (including a wheelchair)?: A Little Help needed standing up from a chair using your arms (e.g., wheelchair or bedside chair)?: A Little Help needed to walk in hospital room?: A Little Help needed climbing 3-5 steps with a railing? : A Little 6 Click Score: 18    End of Session Equipment Utilized During Treatment: Gait belt Activity Tolerance: Patient tolerated treatment well Patient left: in bed;with call bell/phone within reach Nurse Communication: Mobility status PT Visit Diagnosis: Unsteadiness on feet (R26.81);Difficulty in walking, not elsewhere classified (R26.2)     Time: 1610-9604 PT Time Calculation (min) (ACUTE ONLY): 38 min  Charges:  $Gait Training: 8-22 mins $Therapeutic Exercise: 8-22 mins $Therapeutic Activity: 8-22 mins                     Mauro Kaufmann PT Acute Rehabilitation Services Pager [phone] Office [phone]    [facility] 06/03/2022, 10:37 AM

## 2022-06-04 ENCOUNTER — Encounter (HOSPITAL_COMMUNITY): Payer: Self-pay | Admitting: Orthopaedic Surgery

## 2022-06-04 ENCOUNTER — Telehealth: Payer: Self-pay

## 2022-06-04 NOTE — Transitions of Care (Post Inpatient/ED Visit) (Unsigned)
   06/04/2022  Name: Andre [name] MRN: [id number] DOB: 06-14-1955  Today's TOC FU Call Status: Today's TOC FU Call Status:: Unsuccessul Call (1st Attempt) Unsuccessful Call (1st Attempt) Date: 06/04/22  Attempted to reach the patient regarding the most recent Inpatient/ED visit.  Follow Up Plan: Additional outreach attempts will be made to reach the patient to complete the Transitions of Care (Post Inpatient/ED visit) call.   Signature   Agnes Lawrence, CMA (AAMA)  [facility] [phone]

## 2022-06-05 NOTE — Transitions of Care (Post Inpatient/ED Visit) (Unsigned)
   06/05/2022  Name: Andre [name] MRN: [id number] DOB: [birth year]  Today's TOC FU Call Status: Today's TOC FU Call Status:: Unsuccessful Call (2nd Attempt) Unsuccessful Call (1st Attempt) Date: 06/04/22 Unsuccessful Call (2nd Attempt) Date: 06/05/22  Attempted to reach the patient regarding the most recent Inpatient/ED visit.  Follow Up Plan: Additional outreach attempts will be made to reach the patient to complete the Transitions of Care (Post Inpatient/ED visit) call.   Signature   Agnes Lawrence, CMA (AAMA)  [facility] [phone]

## 2022-06-06 NOTE — Transitions of Care (Post Inpatient/ED Visit) (Signed)
   06/06/2022  Name: Andre [name] MRN: [id number] DOB: [birth year]  Today's TOC FU Call Status: Today's TOC FU Call Status:: Unsuccessful Call (3rd Attempt) Unsuccessful Call (1st Attempt) Date: 06/04/22 Unsuccessful Call (2nd Attempt) Date: 06/05/22 Unsuccessful Call (3rd Attempt) Date: 06/06/22  Attempted to reach the patient regarding the most recent Inpatient/ED visit.  Follow Up Plan: No further outreach attempts will be made at this time. We have been unable to contact the patient.  Signature  Agnes Lawrence, CMA (AAMA)  [facility] [phone]

## 2022-06-07 NOTE — Discharge Summary (Signed)
[name] MRN: [id number] DOB/AGE: [birth year] 67 y.o.  Admit date: 06/01/2022 Discharge date: 06/03/22   Admission Diagnoses:  Principal Problem:   Unilateral primary osteoarthritis, right hip Active Problems:   Status post total replacement of right hip   Discharge Diagnoses:  Same  Past Medical History:  Diagnosis Date   Arthritis    COVID-19 virus infection 01/2019   Essential hypertension, benign    History of colon polyps 2014   History of kidney stones    Nephrolithiasis 06/2014   ER visit   Pure hypercholesterolemia     Surgeries: Procedure(s): RIGHT TOTAL HIP ARTHROPLASTY ANTERIOR APPROACH on 06/01/2022   Consultants:   Discharged Condition: Improved  Hospital Course: Andre [name] is an 67 y.o. male who was admitted 06/01/2022 for operative treatment ofUnilateral primary osteoarthritis, right hip. Patient has severe unremitting pain that affects sleep, daily activities, and work/hobbies. After pre-op clearance the patient was taken to the operating room on 06/01/2022 and underwent  Procedure(s): RIGHT TOTAL HIP ARTHROPLASTY ANTERIOR APPROACH.    Patient was given perioperative antibiotics:  Anti-infectives (From admission, onward)    Start     Dose/Rate Route Frequency Ordered Stop   06/01/22 1400  ceFAZolin (ANCEF) IVPB 1 g/50 mL premix        1 g 100 mL/hr over 30 Minutes Intravenous Every 6 hours 06/01/22 1312 06/01/22 2022   06/01/22 0600  ceFAZolin (ANCEF) IVPB 2g/100 mL premix        2 g 200 mL/hr over 30 Minutes Intravenous On call to O.R. 06/01/22 7829 06/01/22 0753        Patient was given sequential compression devices, early ambulation, and chemoprophylaxis to prevent DVT.  Patient benefited maximally from hospital stay and there were no complications.    Recent vital signs: No data found.   Recent laboratory studies: No results for input(s): "WBC", "HGB", "HCT", "PLT", "NA", "K", "CL", "CO2", "BUN", "CREATININE", "GLUCOSE",  "INR", "CALCIUM" in the last 72 hours.  Invalid input(s): "PT", "2"   Discharge Medications:   Allergies as of 06/03/2022       Reactions   Dust Mite Extract Itching   Itching eyes        Medication List     TAKE these medications    aspirin 81 MG chewable tablet Chew 1 tablet (81 mg total) by mouth 2 (two) times daily.   benazepril 20 MG tablet Commonly known as: LOTENSIN Take 1 tablet (20 mg total) by mouth daily.   celecoxib 200 MG capsule Commonly known as: CeleBREX Take 1 capsule (200 mg total) by mouth daily.   MULTIVITAMIN & MINERAL PO Take 1 tablet by mouth daily.   oxyCODONE 5 MG immediate release tablet Commonly known as: Oxy IR/ROXICODONE Take 1-2 tablets (5-10 mg total) by mouth every 6 (six) hours as needed for moderate pain (pain score 4-6). No more than 6 tablets/day.        Diagnostic Studies: DG HIP UNILAT WITH PELVIS 1V RIGHT  Result Date: 06/01/2022 CLINICAL DATA:  [id number] Surgery, elective [id number] EXAM: DG HIP (WITH OR WITHOUT PELVIS) 1V RIGHT COMPARISON:  09/05/2021 FINDINGS: Intraoperative images during right hip arthroplasty. Normal alignment. Expected soft tissue changes. No evidence of immediate hardware complication. IMPRESSION: Intraoperative images during right hip arthroplasty. Normal alignment. No evidence of immediate hardware complication. Electronically Signed   By: Caprice Renshaw M.D.   On: 06/01/2022 09:27   DG Pelvis Portable  Result Date: 06/01/2022 CLINICAL DATA:  [id number] Status post total replacement of  right hip [id number] EXAM: PORTABLE PELVIS 1-2 VIEWS COMPARISON:  Radiograph 09/05/2021 FINDINGS: Postsurgical changes of right hip arthroplasty. Normal alignment. No evidence of loosening or fracture. Expected soft tissue changes. Severe left hip osteoarthritis. IMPRESSION: Postsurgical changes of right hip arthroplasty. No evidence of immediate hardware complication. Normal alignment. Electronically Signed   By: Caprice Renshaw M.D.   On:  06/01/2022 09:26   DG C-Arm 1-60 Min-No Report  Result Date: 06/01/2022 Fluoroscopy was utilized by the requesting physician.  No radiographic interpretation.   DG C-Arm 1-60 Min-No Report  Result Date: 06/01/2022 Fluoroscopy was utilized by the requesting physician.  No radiographic interpretation.    Disposition: Discharge disposition: 01-Home or Self Care       Discharge Instructions     Call MD / Call 911   Complete by: As directed    If you experience chest pain or shortness of breath, CALL 911 and be transported to the hospital emergency room.  If you develope a fever above 101 F, pus (white drainage) or increased drainage or redness at the wound, or calf pain, call your surgeon's office.   Constipation Prevention   Complete by: As directed    Drink plenty of fluids.  Prune juice may be helpful.  You may use a stool softener, such as Colace (over the counter) 100 mg twice a day.  Use MiraLax (over the counter) for constipation as needed.   Diet - low sodium heart healthy   Complete by: As directed    Increase activity slowly as tolerated   Complete by: As directed    Post-operative opioid taper instructions:   Complete by: As directed    POST-OPERATIVE OPIOID TAPER INSTRUCTIONS: It is important to wean off of your opioid medication as soon as possible. If you do not need pain medication after your surgery it is ok to stop day one. Opioids include: Codeine, Hydrocodone(Norco, Vicodin), Oxycodone(Percocet, oxycontin) and hydromorphone amongst others.  Long term and even short term use of opiods can cause: Increased pain response Dependence Constipation Depression Respiratory depression And more.  Withdrawal symptoms can include Flu like symptoms Nausea, vomiting And more Techniques to manage these symptoms Hydrate well Eat regular healthy meals Stay active Use relaxation techniques(deep breathing, meditating, yoga) Do Not substitute Alcohol to help with  tapering If you have been on opioids for less than two weeks and do not have pain than it is ok to stop all together.  Plan to wean off of opioids This plan should start within one week post op of your joint replacement. Maintain the same interval or time between taking each dose and first decrease the dose.  Cut the total daily intake of opioids by one tablet each day Next start to increase the time between doses. The last dose that should be eliminated is the evening dose.           Follow-up Information     [facility] Follow up.   Specialty: Home Health Services Why: [facility] will provide PT in the home after discharge. Contact information: [address] [place] [address] [phone]         Kathryne Hitch, MD Follow up in 2 week(s).   Specialty: Orthopedic Surgery Contact information: [address] [place] [address] [phone]                  Signed: Kathryne Hitch 06/07/2022, 10:58 AM

## 2022-06-14 ENCOUNTER — Encounter: Payer: Self-pay | Admitting: Orthopaedic Surgery

## 2022-06-14 ENCOUNTER — Ambulatory Visit (INDEPENDENT_AMBULATORY_CARE_PROVIDER_SITE_OTHER): Payer: Commercial Managed Care - PPO | Admitting: Orthopaedic Surgery

## 2022-06-14 DIAGNOSIS — Z96641 Presence of right artificial hip joint: Secondary | ICD-10-CM

## 2022-06-14 NOTE — Progress Notes (Signed)
The patient is here for his first postoperative visit status post a right total hip replacement.  He has been compliant with a baby aspirin twice daily.  He is just taking Tylenol and Celebrex for pain.  He is doing well overall.  He is ambulate with a walker and has home therapy coming to see him at least 1 more visit tomorrow.  We discussed transitioning to a cane at some point.  Overall though he is doing well.  There is some foot and ankle swelling but his calf is soft.  His mobility is good.  His right hip incision looks good and the staples have been removed and Steri-Strips applied.  There is no significant seroma.  He will continue to slowly increase his activities as comfort allows.  Will see him back in 4 weeks to see how he is doing overall.  He can stop his aspirin.  All questions and concerns were answered and addressed.

## 2022-06-25 ENCOUNTER — Other Ambulatory Visit: Payer: Self-pay | Admitting: Sports Medicine

## 2022-07-12 ENCOUNTER — Other Ambulatory Visit: Payer: Self-pay | Admitting: Sports Medicine

## 2022-07-12 MED ORDER — CELECOXIB 200 MG PO CAPS: 200.0000 mg | ORAL_CAPSULE | Freq: Every day | ORAL | 1 refills | Status: DC

## 2022-07-16 ENCOUNTER — Encounter: Payer: Self-pay | Admitting: Orthopaedic Surgery

## 2022-07-16 ENCOUNTER — Ambulatory Visit (INDEPENDENT_AMBULATORY_CARE_PROVIDER_SITE_OTHER): Payer: Commercial Managed Care - PPO | Admitting: Orthopaedic Surgery

## 2022-07-16 DIAGNOSIS — Z96641 Presence of right artificial hip joint: Secondary | ICD-10-CM

## 2022-07-16 DIAGNOSIS — M1612 Unilateral primary osteoarthritis, left hip: Secondary | ICD-10-CM

## 2022-07-16 NOTE — Progress Notes (Signed)
The patient continues to follow-up status post a right total hip replacement.  He is 67 years old and is now about 6 weeks out from his replacement.  He has been working diligently with physical therapy to work on his balance and coordination and strength in that right hip.  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  He still walks with a slight limp.  Some of this is related to his right hip surgery but also some of this is related to well-documented arthritis in his left hip that is bone-on-bone.  On exam he does have improved range of motion of his right hip with just a little bit of pain when he first gets up and that should resolve with time but overall that mobility is better.  His left hip with arthritic changes has significant limitations with range of motion.  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  He will continue his hip strengthening exercises and rehabilitation with therapy until then.  The next time we need to see him in terms of x-raying the operative hip would be in 6 months with a standing low AP pelvis and lateral of the right hip.  If there are issues before then they know to let us know.

## 2022-08-06 ENCOUNTER — Encounter: Payer: Self-pay | Admitting: Orthopaedic Surgery

## 2022-08-06 ENCOUNTER — Telehealth: Payer: Self-pay | Admitting: Orthopaedic Surgery

## 2022-08-06 NOTE — Telephone Encounter (Signed)
[employer] forms received. To [employer]. 

## 2022-08-27 ENCOUNTER — Telehealth: Payer: Self-pay | Admitting: Orthopaedic Surgery

## 2022-08-27 ENCOUNTER — Encounter (HOSPITAL_BASED_OUTPATIENT_CLINIC_OR_DEPARTMENT_OTHER): Payer: Self-pay

## 2022-08-27 NOTE — Telephone Encounter (Signed)
Patient called asked if he can get a note for his employer that he can return to work full duty without restrictions. Patient advised he will pick up the note. Patient said he want to return to work this week and will pick up note tomorrow. The number to contact patient is [phone]

## 2022-08-27 NOTE — Telephone Encounter (Signed)
Note done. Called and advised pt

## 2022-09-05 ENCOUNTER — Other Ambulatory Visit: Payer: Self-pay | Admitting: Sports Medicine

## 2022-11-05 ENCOUNTER — Ambulatory Visit (HOSPITAL_BASED_OUTPATIENT_CLINIC_OR_DEPARTMENT_OTHER): Payer: Commercial Managed Care - PPO

## 2022-11-05 ENCOUNTER — Ambulatory Visit (HOSPITAL_BASED_OUTPATIENT_CLINIC_OR_DEPARTMENT_OTHER): Payer: Commercial Managed Care - PPO | Admitting: Student

## 2022-11-05 ENCOUNTER — Encounter (HOSPITAL_BASED_OUTPATIENT_CLINIC_OR_DEPARTMENT_OTHER): Payer: Self-pay | Admitting: Student

## 2022-11-05 DIAGNOSIS — M25561 Pain in right knee: Secondary | ICD-10-CM | POA: Diagnosis not present

## 2022-11-05 DIAGNOSIS — M1711 Unilateral primary osteoarthritis, right knee: Secondary | ICD-10-CM

## 2022-11-05 MED ORDER — LIDOCAINE HCL 1 % IJ SOLN
4.0000 mL | INTRAMUSCULAR | Status: AC | PRN
Start: 2022-11-05 — End: 2022-11-05
  Administered 2022-11-05: 4 mL

## 2022-11-05 MED ORDER — TRIAMCINOLONE ACETONIDE 40 MG/ML IJ SUSP
2.0000 mL | INTRAMUSCULAR | Status: AC | PRN
Start: 2022-11-05 — End: 2022-11-05
  Administered 2022-11-05: 2 mL via INTRA_ARTICULAR

## 2022-11-05 NOTE — Progress Notes (Signed)
Chief Complaint: Right knee pain     History of Present Illness:    Andre [name] is a 67 y.o. male presents today for evaluation of right knee pain.  Patient states that this began about 5 days ago.  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  Pain has continued since then, and he rates it as moderate in severity.  Pain is mainly located over the medial side of the knee.  His symptoms are worsened with going up or down steps, which she often has to do at his job.  He has tried icing and does take Celebrex.  He did have a right total hip arthroplasty done by Dr. Magnus Ivan in May.  Denies any other treatments at this time.   Surgical History:   Right THA 06/01/2022  PMH/PSH/Family History/Social History/Meds/Allergies:    Past Medical History:  Diagnosis Date   Arthritis    COVID-19 virus infection 01/2019   Essential hypertension, benign    History of colon polyps 2014   History of kidney stones    Nephrolithiasis 06/2014   ER visit   Pure hypercholesterolemia    Past Surgical History:  Procedure Laterality Date   COLONOSCOPY  01/2012   tubular adenoma Andre [name]), rpt 5 yrs   COLONOSCOPY  03/2017   hyperplastic polyp, int hem, rpt 5 yrs Andre [name])   TONSILLECTOMY     as a child   TOTAL HIP ARTHROPLASTY Right 06/01/2022   Procedure: RIGHT TOTAL HIP ARTHROPLASTY ANTERIOR APPROACH;  Surgeon: Andre Hitch, MD;  Location: [facility];  Service: Orthopedics;  Laterality: Right;   Social History   Socioeconomic History   Marital status: Married    Spouse name: Not on file   Number of children: 2   Years of education: Not on file   Highest education level: Not on file  Occupational History   Occupation: Airline pilot and part delivery/auto    Employer: [employer]  Tobacco Use   Smoking status: Never   Smokeless tobacco: Never  Vaping Use   Vaping status: Never Used  Substance  and Sexual Activity   Alcohol use: Yes    Alcohol/week: 3.0 standard drinks of alcohol    Types: 3 Glasses of wine per week   Drug use: No   Sexual activity: Yes  Other Topics Concern   Not on file  Social History Narrative   Lives with wife, no pets   Edu: 1 yr college   Occupation: route salesman   Activity: no regular exercise   Diet: good water, fruits/vegetables daily   Social Determinants of Health   Financial Resource Strain: Not on file  Food Insecurity: No Food Insecurity (06/01/2022)   Hunger Vital Sign    Worried About Running Out of Food in the Last Year: Never true    Ran Out of Food in the Last Year: Never true  Transportation Needs: No Transportation Needs (06/01/2022)   PRAPARE - Administrator, Civil Service (Medical): No    Lack of Transportation (Non-Medical): No  Physical Activity: Not on file  Stress: Not on file  Social Connections: Unknown (06/09/2021)   [facility]   Social Network    Social Network: Not on file  Family History  Problem Relation Age of Onset   Colon cancer Sister 19   Breast cancer Sister 65   Hypertension Mother    HIV Brother    Kidney disease Sister    Stomach cancer Neg Hx    CAD Neg Hx    Stroke Neg Hx    Diabetes Neg Hx    Allergies  Allergen Reactions   Dust Mite Extract Itching    Itching eyes   Current Outpatient Medications  Medication Sig Dispense Refill   aspirin 81 MG chewable tablet Chew 1 tablet (81 mg total) by mouth 2 (two) times daily. 30 tablet 0   benazepril (LOTENSIN) 20 MG tablet Take 1 tablet (20 mg total) by mouth daily. 90 tablet 4   celecoxib (CELEBREX) 200 MG capsule Take 1 capsule (200 mg total) by mouth daily. 30 capsule 1   Multiple Vitamins-Minerals (MULTIVITAMIN & MINERAL PO) Take 1 tablet by mouth daily.     oxyCODONE (OXY IR/ROXICODONE) 5 MG immediate release tablet Take 1-2 tablets (5-10 mg total) by mouth every 6 (six) hours as needed for moderate  pain (pain score 4-6). No more than 6 tablets/day. 30 tablet 0   No current facility-administered medications for this visit.   No results found.  Review of Systems:   A ROS was performed including pertinent positives and negatives as documented in the HPI.  Physical Exam :   Constitutional: NAD and appears stated age Neurological: Alert and oriented Psych: Appropriate affect and cooperative There were no vitals taken for this visit.   Comprehensive Musculoskeletal Exam:    Right knee appears without any obvious deformity, erythema, or ecchymosis.  Small effusion present.  Active range of motion from 10 to 100 degrees without significant crepitus.  Positive medial joint line tenderness.  No instability with varus valgus stress.  Imaging:   Xray (right knee 4 views): Mild degenerative changes with narrowing in the medial compartment as well as small patellofemoral osteophytes.    I personally reviewed and interpreted the radiographs.   Assessment:   67 y.o. male with acute medial sided right knee pain.  His symptoms are consistent with patellofemoral pain which I suspect is a result of his increased work activity aggravating pre-existing mild patellofemoral arthritis.  Given this, I have recommended a cortisone injection today as I believe this will provide him with the most relief.  Patient is agreeable and injection was performed today without a complication.  Will plan to assess relief with injection, and can follow-up with me or Dr. Magnus Ivan as needed.  Plan :    -Right knee cortisone injection performed today after patient consent -Return to clinic as needed    Procedure Note  Patient: Andre [name]             Date of Birth: 25-Aug-1955           MRN: [id number]             Visit Date: 11/05/2022  Procedures: Visit Diagnoses:  1. Unilateral primary osteoarthritis, right knee     Large Joint Inj: R knee on 11/05/2022 1:12 PM Indications: pain Details: 22 G 1.5 in  needle, anterolateral approach Medications: 4 mL lidocaine 1 %; 2 mL triamcinolone acetonide 40 MG/ML Outcome: tolerated well, no immediate complications Procedure, treatment alternatives, risks and benefits explained, specific risks discussed. Consent was given by the patient. Immediately prior to procedure a time out was called to verify the correct patient, procedure, equipment, support staff and site/side marked  as required. Patient was prepped and draped in the usual sterile fashion.       I personally saw and evaluated the patient, and participated in the management and treatment plan.  Hazle Nordmann, PA-C Orthopedics

## 2022-12-13 ENCOUNTER — Other Ambulatory Visit: Payer: Self-pay | Admitting: Sports Medicine

## 2023-01-16 ENCOUNTER — Other Ambulatory Visit: Payer: Self-pay | Admitting: Sports Medicine

## 2023-01-16 ENCOUNTER — Ambulatory Visit: Payer: Commercial Managed Care - PPO | Admitting: Orthopaedic Surgery

## 2023-02-01 ENCOUNTER — Other Ambulatory Visit: Payer: Self-pay | Admitting: Family Medicine

## 2023-02-01 DIAGNOSIS — I10 Essential (primary) hypertension: Secondary | ICD-10-CM

## 2023-02-01 NOTE — Telephone Encounter (Signed)
 Pt needs CPE or Welcome to Medicare if he has changed to Medicare.

## 2023-02-04 NOTE — Telephone Encounter (Signed)
 Lvmtcb, sent mychart message

## 2023-02-15 ENCOUNTER — Other Ambulatory Visit: Payer: Self-pay | Admitting: Sports Medicine

## 2023-02-24 ENCOUNTER — Other Ambulatory Visit: Payer: Self-pay | Admitting: Sports Medicine

## 2023-03-04 ENCOUNTER — Telehealth: Payer: Self-pay | Admitting: Sports Medicine

## 2023-03-04 NOTE — Telephone Encounter (Signed)
Patient called advised the Rx for Celecoxib need prior authorization before the Rx can be filled. [facility] on [address]. The number to contact patient is [phone]

## 2023-03-06 NOTE — Telephone Encounter (Signed)
 Submitted for prior authorization on 03/06/2023.

## 2023-03-19 ENCOUNTER — Other Ambulatory Visit: Payer: Self-pay | Admitting: Family Medicine

## 2023-03-19 DIAGNOSIS — I10 Essential (primary) hypertension: Secondary | ICD-10-CM

## 2023-04-01 ENCOUNTER — Other Ambulatory Visit: Payer: Self-pay | Admitting: Family Medicine

## 2023-04-01 DIAGNOSIS — R7303 Prediabetes: Secondary | ICD-10-CM

## 2023-04-01 DIAGNOSIS — I10 Essential (primary) hypertension: Secondary | ICD-10-CM

## 2023-04-01 DIAGNOSIS — E7800 Pure hypercholesterolemia, unspecified: Secondary | ICD-10-CM

## 2023-04-01 DIAGNOSIS — Z125 Encounter for screening for malignant neoplasm of prostate: Secondary | ICD-10-CM

## 2023-04-05 ENCOUNTER — Other Ambulatory Visit (INDEPENDENT_AMBULATORY_CARE_PROVIDER_SITE_OTHER): Payer: Medicare PPO

## 2023-04-05 DIAGNOSIS — E7800 Pure hypercholesterolemia, unspecified: Secondary | ICD-10-CM | POA: Diagnosis not present

## 2023-04-05 DIAGNOSIS — Z125 Encounter for screening for malignant neoplasm of prostate: Secondary | ICD-10-CM | POA: Diagnosis not present

## 2023-04-05 DIAGNOSIS — I10 Essential (primary) hypertension: Secondary | ICD-10-CM | POA: Diagnosis not present

## 2023-04-05 DIAGNOSIS — R7303 Prediabetes: Secondary | ICD-10-CM | POA: Diagnosis not present

## 2023-04-05 LAB — COMPREHENSIVE METABOLIC PANEL
ALT: 21 U/L (ref 0–53)
AST: 17 U/L (ref 0–37)
Albumin: 4.5 g/dL (ref 3.5–5.2)
Alkaline Phosphatase: 80 U/L (ref 39–117)
BUN: 12 mg/dL (ref 6–23)
CO2: 27 meq/L (ref 19–32)
Calcium: 9.6 mg/dL (ref 8.4–10.5)
Chloride: 103 meq/L (ref 96–112)
Creatinine, Ser: 0.83 mg/dL (ref 0.40–1.50)
GFR: 91.87 mL/min (ref >60.00)
Glucose, Bld: 118 mg/dL — ABNORMAL HIGH (ref 70–99)
Potassium: 4.4 meq/L (ref 3.5–5.1)
Sodium: 144 meq/L (ref 135–145)
Total Bilirubin: 0.4 mg/dL (ref 0.2–1.2)
Total Protein: 7.3 g/dL (ref 6.0–8.3)

## 2023-04-05 LAB — LIPID PANEL
Cholesterol: 176 mg/dL (ref 0–200)
HDL: 49.9 mg/dL (ref >39.00)
LDL Cholesterol: 101 mg/dL — ABNORMAL HIGH (ref 0–99)
NonHDL: 126.22
Total CHOL/HDL Ratio: 4
Triglycerides: 124 mg/dL (ref 0.0–149.0)
VLDL: 24.8 mg/dL (ref 0.0–40.0)

## 2023-04-05 LAB — PSA: PSA: 1.74 ng/mL (ref 0.10–4.00)

## 2023-04-05 LAB — TSH: TSH: 1.35 u[IU]/mL (ref 0.35–5.50)

## 2023-04-05 LAB — CBC WITH DIFFERENTIAL/PLATELET
Basophils Absolute: 0 10*3/uL (ref 0.0–0.1)
Basophils Relative: 0.3 % (ref 0.0–3.0)
Eosinophils Absolute: 0.1 10*3/uL (ref 0.0–0.7)
Eosinophils Relative: 1.9 % (ref 0.0–5.0)
HCT: 47.4 % (ref 39.0–52.0)
Hemoglobin: 15.9 g/dL (ref 13.0–17.0)
Lymphocytes Relative: 31.4 % (ref 12.0–46.0)
Lymphs Abs: 2.4 10*3/uL (ref 0.7–4.0)
MCHC: 33.6 g/dL (ref 30.0–36.0)
MCV: 85.2 fl (ref 78.0–100.0)
Monocytes Absolute: 0.8 10*3/uL (ref 0.1–1.0)
Monocytes Relative: 9.9 % (ref 3.0–12.0)
Neutro Abs: 4.4 10*3/uL (ref 1.4–7.7)
Neutrophils Relative %: 56.5 % (ref 43.0–77.0)
Platelets: 258 10*3/uL (ref 150.0–400.0)
RBC: 5.56 Mil/uL (ref 4.22–5.81)
RDW: 13.4 % (ref 11.5–15.5)
WBC: 7.8 10*3/uL (ref 4.0–10.5)

## 2023-04-05 LAB — HEMOGLOBIN A1C: Hgb A1c MFr Bld: 5.9 % (ref 4.6–6.5)

## 2023-04-12 ENCOUNTER — Ambulatory Visit: Payer: Medicare PPO | Admitting: Family Medicine

## 2023-04-12 ENCOUNTER — Encounter: Payer: Self-pay | Admitting: Family Medicine

## 2023-04-12 VITALS — BP 168/86 | HR 84 | Temp 98.1°F | Ht 71.0 in | Wt 228.4 lb

## 2023-04-12 DIAGNOSIS — Z0000 Encounter for general adult medical examination without abnormal findings: Secondary | ICD-10-CM | POA: Diagnosis not present

## 2023-04-12 DIAGNOSIS — Z23 Encounter for immunization: Secondary | ICD-10-CM

## 2023-04-12 DIAGNOSIS — I10 Essential (primary) hypertension: Secondary | ICD-10-CM | POA: Diagnosis not present

## 2023-04-12 DIAGNOSIS — E7800 Pure hypercholesterolemia, unspecified: Secondary | ICD-10-CM

## 2023-04-12 DIAGNOSIS — Z1211 Encounter for screening for malignant neoplasm of colon: Secondary | ICD-10-CM

## 2023-04-12 DIAGNOSIS — Z96641 Presence of right artificial hip joint: Secondary | ICD-10-CM

## 2023-04-12 DIAGNOSIS — E66811 Obesity, class 1: Secondary | ICD-10-CM

## 2023-04-12 DIAGNOSIS — Z7189 Other specified counseling: Secondary | ICD-10-CM

## 2023-04-12 DIAGNOSIS — M1612 Unilateral primary osteoarthritis, left hip: Secondary | ICD-10-CM

## 2023-04-12 DIAGNOSIS — R7303 Prediabetes: Secondary | ICD-10-CM

## 2023-04-12 MED ORDER — BENAZEPRIL HCL 20 MG PO TABS: 20.0000 mg | ORAL_TABLET | Freq: Every day | ORAL | 4 refills | Status: DC

## 2023-04-12 NOTE — Assessment & Plan Note (Addendum)

## 2023-04-12 NOTE — Progress Notes (Signed)
 Subjective:   Today's Vitals   04/12/23 0854 04/12/23 0900  BP: (!) 168/90 (!) 168/86  Pulse: 84   Temp: 98.1 F (36.7 C)   TempSrc: Oral   SpO2: 97%   Weight: 228 lb 6 oz (103.6 kg)   Height: 5\' 11"  (1.803 m)   Elevated on repeat (180/90s).   Body mass index is 31.85 kg/m.   BP Readings from Last 3 Encounters:  04/12/23 (!) 168/86  06/03/22 133/80  05/28/22 (!) 146/92    Wt Readings from Last 3 Encounters:  04/12/23 228 lb 6 oz (103.6 kg)  06/01/22 222 lb 10.6 oz (101 kg)  05/28/22 223 lb (101.2 kg)    Andre [name] is a 68 y.o. male who presents for a Welcome to Medicare exam.     Severe bilateral hip osteoarthritis - s/p R hip replacement 2024. Would like referral back to ortho to discuss L hip replacement.   HTN - he stopped benazepril - "I don't want to take prescription medicines".   Preventative: COLONOSCOPY Date: 01/2012 tubular adenoma [name]), rpt 5 yrs COLONOSCOPY 03/2017 - hyperplastic polyp, int hem, rpt 5 yrs [name]) - fmhx colon cancer Prostate cancer screening - yearly PSA Lung cancer screening - not eligible  Flu shot - yearly Prevnar-20 today Td 2007, Tdap 2017 COVID vaccine - Pfizer 07/2019 x2, booster x1 Shingrix - 10/2019, 10/2020 Advanced directive: not set up. Packet provided today. Would want wife Jolet to be HCPOA.  Seat belt use discussed.  Sunscreen use discussed. No changing moles on skin.  Non smoker. Wife smokes outside.  Alcohol - none.  Dentist - due  Eye exam yearly    Lives with wife, no pets Edu: 1 yr college Occupation: Banker Activity: walking 1 hour about weekly, very involved in church  Diet: good water, fruits/vegetables daily      Objective:    Today's Vitals   04/12/23 0854 04/12/23 0900  BP: (!) 168/90 (!) 168/86  Pulse: 84   Temp: 98.1 F (36.7 C)   TempSrc: Oral   SpO2: 97%   Weight: 228 lb 6 oz (103.6 kg)   Height: 5\' 11"  (1.803 m)    Body mass index is 31.85  kg/m.  Medications Outpatient Encounter Medications as of 04/12/2023  Medication Sig   Multiple Vitamins-Minerals (MULTIVITAMIN & MINERAL PO) Take 1 tablet by mouth daily.   [DISCONTINUED] aspirin 81 MG chewable tablet Chew 1 tablet (81 mg total) by mouth 2 (two) times daily.   [DISCONTINUED] benazepril (LOTENSIN) 20 MG tablet Take 1 tablet by mouth once daily   benazepril (LOTENSIN) 20 MG tablet Take 1 tablet (20 mg total) by mouth daily.   [DISCONTINUED] celecoxib (CELEBREX) 200 MG capsule Take 1 capsule by mouth once daily   [DISCONTINUED] oxyCODONE (OXY IR/ROXICODONE) 5 MG immediate release tablet Take 1-2 tablets (5-10 mg total) by mouth every 6 (six) hours as needed for moderate pain (pain score 4-6). No more than 6 tablets/day.   No facility-administered encounter medications on file as of 04/12/2023.     History: Past Medical History:  Diagnosis Date   Arthritis    COVID-19 virus infection 01/2019   Essential hypertension, benign    History of colon polyps 2014   History of kidney stones    Nephrolithiasis 06/2014   ER visit   Pure hypercholesterolemia    Past Surgical History:  Procedure Laterality Date   COLONOSCOPY  01/2012   tubular adenoma [name]), rpt 5 yrs   COLONOSCOPY  03/2017  hyperplastic polyp, int hem, rpt 5 yrs [name])   TONSILLECTOMY     as a child   TOTAL HIP ARTHROPLASTY Right 06/01/2022   Procedure: RIGHT TOTAL HIP ARTHROPLASTY ANTERIOR APPROACH;  Surgeon: Kathryne Hitch, MD;  Location: [facility];  Service: Orthopedics;  Laterality: Right;    Family History  Problem Relation Age of Onset   Hypertension Mother    Heart disease Mother        enlarged heart   Colon cancer Sister 62   Breast cancer Sister 40   Diabetes Sister    Kidney disease Sister    HIV Brother    Stomach cancer Neg Hx    CAD Neg Hx    Stroke Neg Hx    Social History   Occupational History   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: [employer]   Tobacco Use   Smoking status: Never   Smokeless tobacco: Never  Vaping Use   Vaping status: Never Used  Substance and Sexual Activity   Alcohol use: Yes    Alcohol/week: 3.0 standard drinks of alcohol    Types: 3 Glasses of wine per week   Drug use: No   Sexual activity: Yes    Tobacco Counseling Counseling given: No Non smoker  Immunizations and Health Maintenance Immunization History  Administered Date(s) Administered   Fluad Trivalent(High Dose 65+) 04/12/2023   Influenza Whole 01/09/2010   Influenza,inj,Quad PF,6+ Mos 11/04/2019, 11/07/2020, 01/30/2022   PFIZER(Purple Top)SARS-COV-2 Vaccination 08/02/2019, 08/22/2019   PNEUMOCOCCAL CONJUGATE-20 04/12/2023   Rsv, Bivalent, Protein Subunit Rsvpref,pf (Abrysvo) 05/14/2022   Td 06/13/2005   Tdap 12/14/2015   Zoster Recombinant(Shingrix) 11/24/2019, 11/07/2020   Health Maintenance Due  Topic Date Due   HIV Screening  Never done   Colonoscopy  03/25/2022   COVID-19 Vaccine (4 - 2024-25 season) 09/30/2022    Activities of Daily Living    06/01/2022    1:09 PM 05/28/2022    9:41 AM  In your present state of health, do you have any difficulty performing the following activities:  Hearing? 0   Vision? 0   Difficulty concentrating or making decisions? 0   Walking or climbing stairs? 1   Dressing or bathing? 0   Doing errands, shopping? 0 0    Physical Exam   Physical Exam Vitals and nursing note reviewed.  Constitutional:      General: He is not in acute distress.    Appearance: Normal appearance. He is well-developed. He is not ill-appearing.  HENT:     Head: Normocephalic and atraumatic.     Right Ear: Hearing, ear canal and external ear normal.     Left Ear: Hearing, ear canal and external ear normal.     Mouth/Throat:     Mouth: Mucous membranes are moist.     Pharynx: Oropharynx is clear. No oropharyngeal exudate or posterior oropharyngeal erythema.  Eyes:     General: No scleral icterus.    Extraocular  Movements: Extraocular movements intact.     Conjunctiva/sclera: Conjunctivae normal.     Pupils: Pupils are equal, round, and reactive to light.  Neck:     Thyroid: No thyroid mass or thyromegaly.     Vascular: No carotid bruit.  Cardiovascular:     Rate and Rhythm: Normal rate and regular rhythm.     Pulses: Normal pulses.          Radial pulses are 2+ on the right side and 2+ on the left side.  Heart sounds: Normal heart sounds. No murmur heard. Pulmonary:     Effort: Pulmonary effort is normal. No respiratory distress.     Breath sounds: Normal breath sounds. No wheezing, rhonchi or rales.  Abdominal:     General: Bowel sounds are normal. There is no distension.     Palpations: Abdomen is soft. There is no mass.     Tenderness: There is no abdominal tenderness. There is no guarding or rebound.     Hernia: No hernia is present.  Musculoskeletal:        General: Normal range of motion.     Cervical back: Normal range of motion and neck supple.     Right lower leg: No edema.     Left lower leg: No edema.  Lymphadenopathy:     Cervical: No cervical adenopathy.  Skin:    General: Skin is warm and dry.     Findings: No rash.  Neurological:     General: No focal deficit present.     Mental Status: He is alert and oriented to person, place, and time.  Psychiatric:        Mood and Affect: Mood normal.        Behavior: Behavior normal.        Thought Content: Thought content normal.        Judgment: Judgment normal.    (optional), or other factors deemed appropriate based on the beneficiary's medical and social history and current clinical standards.   Advanced Directives: Advanced directive: not set up. Packet provided today. Would want wife Jolet to be HCPOA.      EKG:  Normal EKG, normal sinus rhythm    Assessment:    This is a routine wellness  examination for this patient .   Vision/Hearing screen Hearing Screening   500Hz  1000Hz  2000Hz  3000Hz   Right ear 20 20  20 25   Left ear 20 20 20 20    Vision Screening   Right eye Left eye Both eyes  Without correction 20/25 20/20 20/20   With correction        Goals      Increase physical activity         Depression Screen    04/12/2023    9:13 AM 01/30/2022    1:09 PM 01/30/2022   12:33 PM 11/07/2020    4:04 PM  PHQ 2/9 Scores  PHQ - 2 Score 0 0 0 0     Fall Risk    04/12/2023    9:44 AM  Fall Risk   Falls in the past year? 0    Cognitive Function        04/12/2023    9:15 AM  6CIT Screen  What Year? 0 points  What month? 0 points  What time? 0 points  Count back from 20 0 points  Months in reverse 0 points  Repeat phrase 0 points  Total Score 0 points    Patient Care Team: Eustaquio Boyden, MD as PCP - General (Family Medicine)      Results for orders placed or performed in visit on 04/05/23  Lipid panel   Collection Time: 04/05/23  7:48 AM  Result Value Ref Range   Cholesterol 176 0 - 200 mg/dL   Triglycerides 401.0 0.0 - 149.0 mg/dL   HDL 27.25 >36.64 mg/dL   VLDL 40.3 0.0 - 47.4 mg/dL   LDL Cholesterol 259 (H) 0 - 99 mg/dL   Total CHOL/HDL Ratio 4    NonHDL 126.22  Comprehensive metabolic panel   Collection Time: 04/05/23  7:48 AM  Result Value Ref Range   Sodium 144 135 - 145 mEq/L   Potassium 4.4 3.5 - 5.1 mEq/L   Chloride 103 96 - 112 mEq/L   CO2 27 19 - 32 mEq/L   Glucose, Bld 118 (H) 70 - 99 mg/dL   BUN 12 6 - 23 mg/dL   Creatinine, Ser 6.64 0.40 - 1.50 mg/dL   Total Bilirubin 0.4 0.2 - 1.2 mg/dL   Alkaline Phosphatase 80 39 - 117 U/L   AST 17 0 - 37 U/L   ALT 21 0 - 53 U/L   Total Protein 7.3 6.0 - 8.3 g/dL   Albumin 4.5 3.5 - 5.2 g/dL   GFR 40.34 >74.25 mL/min   Calcium 9.6 8.4 - 10.5 mg/dL  Hemoglobin Z5G   Collection Time: 04/05/23  7:48 AM  Result Value Ref Range   Hgb A1c MFr Bld 5.9 4.6 - 6.5 %  PSA   Collection Time: 04/05/23  7:48 AM  Result Value Ref Range   PSA 1.74 0.10 - 4.00 ng/mL  CBC with Differential/Platelet    Collection Time: 04/05/23  7:48 AM  Result Value Ref Range   WBC 7.8 4.0 - 10.5 K/uL   RBC 5.56 4.22 - 5.81 Mil/uL   Hemoglobin 15.9 13.0 - 17.0 g/dL   HCT 38.7 56.4 - 33.2 %   MCV 85.2 78.0 - 100.0 fl   MCHC 33.6 30.0 - 36.0 g/dL   RDW 95.1 88.4 - 16.6 %   Platelets 258.0 150.0 - 400.0 K/uL   Neutrophils Relative % 56.5 43.0 - 77.0 %   Lymphocytes Relative 31.4 12.0 - 46.0 %   Monocytes Relative 9.9 3.0 - 12.0 %   Eosinophils Relative 1.9 0.0 - 5.0 %   Basophils Relative 0.3 0.0 - 3.0 %   Neutro Abs 4.4 1.4 - 7.7 K/uL   Lymphs Abs 2.4 0.7 - 4.0 K/uL   Monocytes Absolute 0.8 0.1 - 1.0 K/uL   Eosinophils Absolute 0.1 0.0 - 0.7 K/uL   Basophils Absolute 0.0 0.0 - 0.1 K/uL  TSH   Collection Time: 04/05/23  7:48 AM  Result Value Ref Range   TSH 1.35 0.35 - 5.50 uIU/mL    Plan:     I have personally reviewed and noted the following in the patient's chart:   Medical and social history Use of alcohol, tobacco or illicit drugs  Current medications and supplements Functional ability and status Nutritional status Physical activity Advanced directives List of other physicians Hospitalizations, surgeries, and ER visits in previous 12 months Vitals Screenings to include cognitive, depression, and falls Referrals and appointments  In addition, I have reviewed and discussed with patient certain preventive protocols, quality metrics, and best practice recommendations. A written personalized care plan for preventive services as well as general preventive health recommendations were provided to patient.  Welcome to Medicare preventive visit Assessment & Plan: I have personally reviewed the Medicare Annual Wellness questionnaire and have noted 1. The patient's medical and social history 2. Their use of alcohol, tobacco or illicit drugs 3. Their current medications and supplements 4. The patient's functional ability including ADL's, fall risks, home safety risks and hearing or visual  impairment. Cognitive function has been assessed and addressed as indicated.  5. Diet and physical activity 6. Evidence for depression or mood disorders The patients weight, height, BMI have been recorded in the chart. I have made referrals, counseling and provided education to the  patient based on review of the above and I have provided the pt with a written personalized care plan for preventive services. Provider list updated.. See scanned questionairre as needed for further documentation. Reviewed preventative protocols and updated unless pt declined.   Orders: -     EKG 12-Lead  Need for influenza vaccination -     Flu Vaccine Trivalent High Dose (Fluad)  Encounter for Medicare annual wellness exam -     EKG 12-Lead  Essential hypertension, benign -     Benazepril HCl; Take 1 tablet (20 mg total) by mouth daily.  Dispense: 90 tablet; Refill: 4  Unilateral primary osteoarthritis, left hip Assessment & Plan: Refer back to [name] to discuss possible L hip replacement  Orders: -     Ambulatory referral to Orthopedic Surgery  Special screening for malignant neoplasms, colon -     Ambulatory referral to Gastroenterology  Need for vaccination against Streptococcus pneumoniae -     Pneumococcal conjugate vaccine 20-valent  Advanced directives, counseling/discussion Assessment & Plan: Advanced directive: not set up. Packet provided today. Would want wife Jolet to be HCPOA.    Status post total replacement of right hip  Prediabetes Assessment & Plan: Reviewed limiting added sugar /sweetened beverages in diet .   Obesity, Class I, BMI 30.0-34.9 (see actual BMI) Assessment & Plan: Continue to encourage healthy diet and lifestyle choices to affect sustainable weight loss   Primary hypertension Assessment & Plan: Chronic, deteriorated as he decided to stop benazepril. Discussed need to treat hypertension as well as risks of uncontrolled hypertension including  MI/stroke.  Reviewed diet and lifestyle choices to help control BP, do recommend restarting antihypertensive. He agrees to restart benazepril which he previously tolerated well.  RTC 3 mo HTN f/u visit    HYPERCHOLESTEROLEMIA Assessment & Plan: Chronic, not on statin. Encouraged diet choices to maintain cholesterol control.  The 10-year ASCVD risk score (Arnett DK, et al., 2019) is: 21.1%   Values used to calculate the score:     Age: 46 years     Sex: Male     Is Non-Hispanic African American: No     Diabetic: No     Tobacco smoker: No     Systolic Blood Pressure: 168 mmHg     Is BP treated: Yes     HDL Cholesterol: 49.9 mg/dL     Total Cholesterol: 176 mg/dL      Patient Instructions  You may call [facility] to schedule an appointment at [phone].   I will refer you back to [name] orthopedics Advanced directive packet provided today  Flu shot today  Prevnar-20 today.  Start checking BP at home - restart benazepril.  Return in 3 months for blood pressure follow up visit.  Return in 1 year for next physical/wellness visit   Mr. Fabro, Thank you for taking time to come for your Medicare Wellness Visit. I appreciate your ongoing commitment to your health goals. Please review the following plan we discussed and let me know if I can assist you in the future.   These are the goals we discussed:  Goals      Increase physical activity        This is a list of the screening recommended for you and due dates:  Health Maintenance  Topic Date Due   HIV Screening  Never done   Colon Cancer Screening  03/25/2022   COVID-19 Vaccine (3 - 2024-25 season) 09/30/2022   Pneumonia Vaccine (1  of 1 - PCV) Never done   Medicare Annual Wellness Visit  04/11/2024   DTaP/Tdap/Td vaccine (3 - Td or Tdap) 12/13/2025   Flu Shot  Completed   Hepatitis C Screening  Completed   Zoster (Shingles) Vaccine  Completed   HPV Vaccine  Aged Out       Eustaquio Boyden,  MD 04/13/2023

## 2023-04-12 NOTE — Patient Instructions (Addendum)
 You may call [name] to schedule an appointment at [phone].   I will refer you back to [name] orthopedics Advanced directive packet provided today  Flu shot today  Prevnar-20 today.  Start checking BP at home - restart benazepril.  Return in 3 months for blood pressure follow up visit.  Return in 1 year for next physical/wellness visit   Mr. Andre [name], Thank you for taking time to come for your Medicare Wellness Visit. I appreciate your ongoing commitment to your health goals. Please review the following plan we discussed and let me know if I can assist you in the future.   These are the goals we discussed:  Goals      Increase physical activity        This is a list of the screening recommended for you and due dates:  Health Maintenance  Topic Date Due   HIV Screening  Never done   Colon Cancer Screening  03/25/2022   COVID-19 Vaccine (3 - 2024-25 season) 09/30/2022   Pneumonia Vaccine (1 of 1 - PCV) Never done   Medicare Annual Wellness Visit  04/11/2024   DTaP/Tdap/Td vaccine (3 - Td or Tdap) 12/13/2025   Flu Shot  Completed   Hepatitis C Screening  Completed   Zoster (Shingles) Vaccine  Completed   HPV Vaccine  Aged Out

## 2023-04-13 ENCOUNTER — Encounter: Payer: Self-pay | Admitting: Family Medicine

## 2023-04-13 NOTE — Assessment & Plan Note (Addendum)
 Reviewed limiting added sugar /sweetened beverages in diet

## 2023-04-13 NOTE — Assessment & Plan Note (Signed)
 Chronic, not on statin. Encouraged diet choices to maintain cholesterol control.  The 10-year ASCVD risk score (Arnett DK, et al., 2019) is: 21.1%   Values used to calculate the score:     Age: 68 years     Sex: Male     Is Non-Hispanic African American: No     Diabetic: No     Tobacco smoker: No     Systolic Blood Pressure: 168 mmHg     Is BP treated: Yes     HDL Cholesterol: 49.9 mg/dL     Total Cholesterol: 176 mg/dL

## 2023-04-13 NOTE — Assessment & Plan Note (Signed)
 Refer back to [name] to discuss possible L hip replacement

## 2023-04-13 NOTE — Assessment & Plan Note (Signed)
 Chronic, deteriorated as he decided to stop benazepril. Discussed need to treat hypertension as well as risks of uncontrolled hypertension including MI/stroke.  Reviewed diet and lifestyle choices to help control BP, do recommend restarting antihypertensive. He agrees to restart benazepril which he previously tolerated well.  RTC 3 mo HTN f/u visit

## 2023-04-13 NOTE — Assessment & Plan Note (Signed)
Advanced directive: not set up. Packet provided today. Would want wife Jolet to be HCPOA.

## 2023-04-13 NOTE — Assessment & Plan Note (Signed)
 Continue to encourage healthy diet and lifestyle choices to affect sustainable weight loss.

## 2023-04-26 ENCOUNTER — Emergency Department (HOSPITAL_BASED_OUTPATIENT_CLINIC_OR_DEPARTMENT_OTHER)
Admission: EM | Admit: 2023-04-26 | Discharge: 2023-04-26 | Disposition: A | Discharge status: Home / Self Care | Attending: Emergency Medicine | Admitting: Emergency Medicine

## 2023-04-26 ENCOUNTER — Other Ambulatory Visit: Payer: Self-pay

## 2023-04-26 ENCOUNTER — Encounter (HOSPITAL_BASED_OUTPATIENT_CLINIC_OR_DEPARTMENT_OTHER): Payer: Self-pay | Admitting: Emergency Medicine

## 2023-04-26 DIAGNOSIS — U071 COVID-19: Secondary | ICD-10-CM | POA: Diagnosis not present

## 2023-04-26 DIAGNOSIS — I10 Essential (primary) hypertension: Secondary | ICD-10-CM | POA: Diagnosis not present

## 2023-04-26 DIAGNOSIS — R059 Cough, unspecified: Secondary | ICD-10-CM | POA: Diagnosis present

## 2023-04-26 LAB — RESP PANEL BY RT-PCR (RSV, FLU A&B, COVID)  RVPGX2
Influenza A by PCR: NEGATIVE
Influenza B by PCR: NEGATIVE
Resp Syncytial Virus by PCR: NEGATIVE
SARS Coronavirus 2 by RT PCR: POSITIVE — AB

## 2023-04-26 LAB — I-STAT CHEM 8, ED
BUN: 8 mg/dL (ref 8–23)
Calcium, Ion: 1.18 mmol/L (ref 1.15–1.40)
Chloride: 99 mmol/L (ref 98–111)
Creatinine, Ser: 0.9 mg/dL (ref 0.61–1.24)
Glucose, Bld: 103 mg/dL — ABNORMAL HIGH (ref 70–99)
HCT: 51 % (ref 39.0–52.0)
Hemoglobin: 17.3 g/dL — ABNORMAL HIGH (ref 13.0–17.0)
Potassium: 4 mmol/L (ref 3.5–5.1)
Sodium: 137 mmol/L (ref 135–145)
TCO2: 24 mmol/L (ref 22–32)

## 2023-04-26 LAB — GROUP A STREP BY PCR: Group A Strep by PCR: NOT DETECTED

## 2023-04-26 MED ORDER — PAXLOVID (300/100) 20 X 150 MG & 10 X 100MG PO TBPK: 3.0000 | ORAL_TABLET | Freq: Two times a day (BID) | ORAL | 0 refills | Status: DC

## 2023-04-26 MED ORDER — ACETAMINOPHEN 500 MG PO TABS
1000.0000 mg | ORAL_TABLET | Freq: Once | ORAL | Status: AC
  Administered 2023-04-26: 1000 mg via ORAL
  Filled 2023-04-26: qty 2

## 2023-04-26 NOTE — Discharge Instructions (Addendum)
 You were evaluated in the emergency room for cough and congestion.You may use Tylenol 1000 mg and/or Motrin 600 mg every 4-6 hours up to 3 times a day for fever or body aches.  Please keep in mind that this dosing is not meant to be continued long-term and that many over-the-counter cough and flu medications contain acetaminophen or ibuprofen. You can expect your current symptoms to linger over the next week or two but please return to the emergency room if you experience any new or worsening symptoms including persistent fevers, worsening productive cough and persistent vomiting. Please follow-up with your primary care provider regarding your ER visit.  It is your responsibility to obtain any results from your visit and review any findings with your primary care doctor.

## 2023-04-26 NOTE — ED Provider Notes (Signed)
 [facility] Provider Note   CSN: [id number] Arrival date & time: 04/26/23  1255     History  Chief Complaint  Patient presents with   Flulike Symptoms    Andre [name] is a 68 y.o. male with history of hypertension presents with complaints of cough and congestion that started 2 nights ago.  Endorses subjective fevers.  No abdominal pain vomiting or diarrhea.  No chest pain or shortness of breath.  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  HPI    Past Medical History:  Diagnosis Date   Arthritis    COVID-19 virus infection 01/2019   Essential hypertension, benign    History of colon polyps 2014   History of kidney stones    Nephrolithiasis 06/2014   ER visit   Pure hypercholesterolemia       Home Medications Prior to Admission medications   Medication Sig Start Date End Date Taking? Authorizing Provider  benazepril (LOTENSIN) 20 MG tablet Take 1 tablet (20 mg total) by mouth daily. 04/12/23   Eustaquio Boyden, MD  Multiple Vitamins-Minerals (MULTIVITAMIN & MINERAL PO) Take 1 tablet by mouth daily.    [provider]      Allergies    Dust mite extract and Celebrex [celecoxib]    Review of Systems   Review of Systems  Respiratory:  Positive for cough.     Physical Exam Updated Vital Signs BP (!) 184/106 (BP Location: Left Arm)   Pulse (!) 111   Temp 98.8 F (37.1 C) (Oral)   Resp 20   Ht 6' (1.829 m)   Wt 103.9 kg   SpO2 96%   BMI 31.06 kg/m  Physical Exam Vitals and nursing note reviewed.  Constitutional:      General: He is not in acute distress.    Appearance: He is well-developed.  HENT:     Head: Normocephalic and atraumatic.  Eyes:     Conjunctiva/sclera: Conjunctivae normal.  Cardiovascular:     Rate and Rhythm: Regular rhythm. Tachycardia present.     Heart sounds: No murmur heard. Pulmonary:     Effort: Pulmonary effort is  normal. No respiratory distress.     Breath sounds: Normal breath sounds. No wheezing or rales.  Abdominal:     Palpations: Abdomen is soft.     Tenderness: There is no abdominal tenderness.  Musculoskeletal:        General: No swelling.     Cervical back: Neck supple.  Skin:    General: Skin is warm and dry.     Capillary Refill: Capillary refill takes less than 2 seconds.  Neurological:     Mental Status: He is alert.  Psychiatric:        Mood and Affect: Mood normal.     ED Results / Procedures / Treatments   Labs (all labs ordered are listed, but only abnormal results are displayed) Labs Reviewed  RESP PANEL BY RT-PCR (RSV, FLU A&B, COVID)  RVPGX2 - Abnormal; Notable for the following components:      Result Value   SARS Coronavirus 2 by RT PCR POSITIVE (*)    All other components within normal limits  GROUP A STREP BY PCR  I-STAT CHEM 8, ED    EKG None  Radiology No results found.  Procedures Procedures    Medications Ordered in ED Medications - No data to display  ED Course/ Medical  Decision Making/ A&P                                 Medical Decision Making  This patient presents to the ED with chief complaint(s) of cough.  The complaint involves an extensive differential diagnosis and also carries with it a high risk of complications and morbidity.   pertinent past medical history as listed in HPI  The differential diagnosis includes  viral illness, pharyngitis, mono, sinusitis,AOM, pneumonia  The initial plan is to  Obtain respiratory panel Additional history obtained: Additional history obtained from spouse Records reviewed Care Everywhere/External Records  Initial Assessment:   Nontoxic-appearing patient presenting with URI symptoms.  I have a low suspicion for pneumonia as lung sounds are clear and patient is afebrile.  No exudate or significant cervical lymphadenopathy on exam to suggest strep or mono.  Overall history and exam are most  suspicious for viral URI.  Patient is hemodynamically stable and not requiring oxygen.  Anticipate discharge home with supportive care.  Independent ECG interpretation:  None   Independent labs interpretation:  The following labs were independently interpreted:  Respiratory panel positive for COVID-19, Chem-8 with creatinine normal limits  Independent visualization and interpretation of imaging: None  Treatment and Reassessment: No medications administered during visit   Consultations obtained:   None   Disposition:   Patient will be discharged home.  Educated on supportive care.  Encouraged to follow-up with primary care provider should her symptoms persist. The patient has been appropriately medically screened and/or stabilized in the ED. I have low suspicion for any other emergent medical condition which would require further screening, evaluation or treatment in the ED or require inpatient management. At time of discharge the patient is hemodynamically stable and in no acute distress. I have discussed work-up results and diagnosis with patient and answered all questions. Patient is agreeable with discharge plan. We discussed strict return precautions for returning to the emergency department and they verbalized understanding.     Social Determinants of Health:   none  This note was dictated with voice recognition software.  Despite best efforts at proofreading, errors may have occurred which can change the documentation meaning.          Final Clinical Impression(s) / ED Diagnoses Final diagnoses:  COVID-19    Rx / DC Orders ED Discharge Orders     None         Halford Decamp, PA-C 04/26/23 1658    Vanetta Mulders, MD 04/26/23 [phone]

## 2023-04-26 NOTE — ED Notes (Signed)
 Patient called for work note.  Nurse will leave at front desk

## 2023-04-26 NOTE — ED Triage Notes (Signed)
 Pt reports sore throat, HA, congestion, cough, fever & body aches since yesterday; denies ShoB or CP

## 2023-04-29 ENCOUNTER — Ambulatory Visit (INDEPENDENT_AMBULATORY_CARE_PROVIDER_SITE_OTHER): Admitting: Internal Medicine

## 2023-04-29 ENCOUNTER — Ambulatory Visit: Payer: Self-pay

## 2023-04-29 ENCOUNTER — Ambulatory Visit
Admission: RE | Admit: 2023-04-29 | Discharge: 2023-04-29 | Disposition: A | Discharge status: Home / Self Care | Source: Ambulatory Visit | Attending: Internal Medicine | Admitting: Internal Medicine

## 2023-04-29 ENCOUNTER — Encounter: Payer: Self-pay | Admitting: Internal Medicine

## 2023-04-29 VITALS — BP 116/78 | HR 98 | Temp 98.5°F | Ht 71.0 in | Wt 223.0 lb

## 2023-04-29 DIAGNOSIS — U071 COVID-19: Secondary | ICD-10-CM

## 2023-04-29 NOTE — Progress Notes (Signed)
 Subjective:    [name], male    DOB: 1955-07-09, 68 y.o.   MRN: [id number]  HPI Here due to feeling worse--diagnosed with COVID infection 3 days ago With wife  Started 5 days ago---with sore throat Having fever Bad sinus and head congestion Now congested in chest and having SOB Getting some sweats Coughs with deep breath--but not otherwise  Rx for paxlovid--but couldn't afford it  Taking coricidin and tylenol  Current Outpatient Medications on File Prior to Visit  Medication Sig Dispense Refill   benazepril (LOTENSIN) 20 MG tablet Take 1 tablet (20 mg total) by mouth daily. 90 tablet 4   Multiple Vitamins-Minerals (MULTIVITAMIN & MINERAL PO) Take 1 tablet by mouth daily.     Turmeric, Curcuma Longa, (CURCUMIN) POWD by Does not apply route.     No current facility-administered medications on file prior to visit.    Allergies  Allergen Reactions   Dust Mite Extract Itching    Itching eyes   Celebrex [Celecoxib] Other (See Comments)    palpitations    Past Medical History:  Diagnosis Date   Arthritis    COVID-19 virus infection 01/2019   Essential hypertension, benign    History of colon polyps 2014   History of kidney stones    Nephrolithiasis 06/2014   ER visit   Pure hypercholesterolemia     Past Surgical History:  Procedure Laterality Date   COLONOSCOPY  01/2012   tubular adenoma [name]), rpt 5 yrs   COLONOSCOPY  03/2017   hyperplastic polyp, int hem, rpt 5 yrs [name])   TONSILLECTOMY     as a child   TOTAL HIP ARTHROPLASTY Right 06/01/2022   Procedure: RIGHT TOTAL HIP ARTHROPLASTY ANTERIOR APPROACH;  Surgeon: Kathryne Hitch, MD;  Location: [facility];  Service: Orthopedics;  Laterality: Right;    Family History  Problem Relation Age of Onset   Hypertension Mother    Heart disease Mother        enlarged heart   Colon cancer Sister 65   Breast cancer Sister 38   Diabetes Sister    Kidney disease Sister    HIV Brother     Stomach cancer Neg Hx    CAD Neg Hx    Stroke Neg Hx     Social History   Socioeconomic History   Marital status: Married    Spouse name: Not on file   Number of children: 2   Years of education: Not on file   Highest education level: Not on file  Occupational History   Occupation: Airline pilot and part delivery/auto    Employer: [employer]  Tobacco Use   Smoking status: Never   Smokeless tobacco: Never  Vaping Use   Vaping status: Never Used  Substance and Sexual Activity   Alcohol use: Yes    Alcohol/week: 3.0 standard drinks of alcohol    Types: 3 Glasses of wine per week   Drug use: No   Sexual activity: Yes  Other Topics Concern   Not on file  Social History Narrative   Lives with wife, no pets   Edu: 1 yr college   Occupation: Paediatric nurse   Activity: no regular exercise   Diet: good water, fruits/vegetables daily   Social Drivers of Corporate investment banker Strain: Low Risk  (04/12/2023)   Overall Financial Resource Strain (CARDIA)    Difficulty of Paying Living Expenses: Not hard at all  Food Insecurity: No Food Insecurity (04/12/2023)  Hunger Vital Sign    Worried About Running Out of Food in the Last Year: Never true    Ran Out of Food in the Last Year: Never true  Transportation Needs: No Transportation Needs (04/12/2023)   PRAPARE - Administrator, Civil Service (Medical): No    Lack of Transportation (Non-Medical): No  Physical Activity: Insufficiently Active (04/12/2023)   Exercise Vital Sign    Days of Exercise per Week: 5 days    Minutes of Exercise per Session: 20 min  Stress: No Stress Concern Present (04/12/2023)   [employer] of Occupational Health - Occupational Stress Questionnaire    Feeling of Stress : Not at all  Social Connections: Socially Integrated (04/12/2023)   Social Connection and Isolation Panel [NHANES]    Frequency of Communication with Friends and Family: Twice a week     Frequency of Social Gatherings with Friends and Family: Once a week    Attends Religious Services: More than 4 times per year    Active Member of [employer] or Organizations: Yes    Attends Banker Meetings: 1 to 4 times per year    Marital Status: Married  Catering manager Violence: Not At Risk (04/12/2023)   Humiliation, Afraid, Rape, and Kick questionnaire    Fear of Current or Ex-Partner: No    Emotionally Abused: No    Physically Abused: No    Sexually Abused: No   Review of Systems Some nosebleeds No change in smell or taste No N/V Appetite is okay    Objective:   Physical Exam Constitutional:      General: He is not in acute distress.    Appearance: Normal appearance.     Comments: clammy  HENT:     Right Ear: Tympanic membrane and ear canal normal.     Left Ear: Tympanic membrane and ear canal normal.     Mouth/Throat:     Pharynx: No oropharyngeal exudate or posterior oropharyngeal erythema.  Pulmonary:     Effort: Pulmonary effort is normal.     Breath sounds: Normal breath sounds. No wheezing or rales.  Musculoskeletal:     Cervical back: Neck supple.  Lymphadenopathy:     Cervical: No cervical adenopathy.  Neurological:     Mental Status: He is alert.            Assessment & Plan:

## 2023-04-29 NOTE — Telephone Encounter (Signed)
 Appreciate Dr Alphonsus Sias seeing pt

## 2023-04-29 NOTE — Assessment & Plan Note (Addendum)
 Has had in the past Last time had paxlovid (still covered then) and prednisone Still feels congested and SOB Will check CXR--normal to me Discussed rest, stay out of work till later this week Analgesics, cough med prn Discussed prednisone--not indicated

## 2023-04-29 NOTE — Telephone Encounter (Signed)
 Chief Complaint: Shortness of Breath Symptoms: Nasal congestion, cough, runny nose Frequency: x 2 days Pertinent Negatives: Patient denies CP, N/V Disposition: [] ED /[] Urgent Care (no appt availability in office) / [x] Appointment(In office/virtual)/ []  [place]/ [] Home Care/ [] Refused Recommended Disposition /[] [facility]/ []  Follow-up with PCP Additional Notes: Pt reports he was dx with Covid at [facility] 03/28. Notes he has been experiencing worsening SOB since yesterday. Denies CP, N/V. Reports sweats but no confirmed fever. OV scheduled today. This RN educated pt on home care, new-worsening symptoms, when to call back/seek emergent care. Pt verbalized understanding and agrees to plan.    Copied from CRM [id number]. Topic: Clinical - Red Word Triage >> Apr 29, 2023  9:54 AM Saverio Danker wrote: Red Word that prompted transfer to Nurse Triage: short of breath Reason for Disposition  [1] MILD difficulty breathing (e.g., minimal/no SOB at rest, SOB with walking, pulse <100) AND [2] NEW-onset or WORSE than normal  Answer Assessment - Initial Assessment Questions 1. RESPIRATORY STATUS: "Describe your breathing?" (e.g., wheezing, shortness of breath, unable to speak, severe coughing)      SOB 2. ONSET: "When did this breathing problem begin?"      Began yesterday 3. PATTERN "Does the difficult breathing come and go, or has it been constant since it started?"      Constant 4. SEVERITY: "How bad is your breathing?" (e.g., mild, moderate, severe)    - MILD: No SOB at rest, mild SOB with walking, speaks normally in sentences, can lie down, no retractions, pulse < 100.    - MODERATE: SOB at rest, SOB with minimal exertion and prefers to sit, cannot lie down flat, speaks in phrases, mild retractions, audible wheezing, pulse 100-120.    - SEVERE: Very SOB at rest, speaks in single words, struggling to breathe, sitting hunched forward, retractions, pulse > 120      Moderate  6.  CARDIAC HISTORY: "Do you have any history of heart disease?" (e.g., heart attack, angina, bypass surgery, angioplasty)      HTN 7. LUNG HISTORY: "Do you have any history of lung disease?"  (e.g., pulmonary embolus, asthma, emphysema)     None 8. CAUSE: "What do you think is causing the breathing problem?"      Pt dx with Covid at [facility] 03/28 9. OTHER SYMPTOMS: "Do you have any other symptoms? (e.g., dizziness, runny nose, cough, chest pain, fever)     Nasal congestion, runny nose, sweats  Protocols used: Breathing Difficulty-A-AH

## 2023-05-06 ENCOUNTER — Encounter: Payer: Self-pay | Admitting: Internal Medicine

## 2023-05-09 ENCOUNTER — Ambulatory Visit: Admitting: Orthopaedic Surgery

## 2023-05-09 ENCOUNTER — Other Ambulatory Visit (INDEPENDENT_AMBULATORY_CARE_PROVIDER_SITE_OTHER): Payer: Self-pay

## 2023-05-09 DIAGNOSIS — Z96641 Presence of right artificial hip joint: Secondary | ICD-10-CM | POA: Diagnosis not present

## 2023-05-09 DIAGNOSIS — M25552 Pain in left hip: Secondary | ICD-10-CM

## 2023-05-09 NOTE — Progress Notes (Signed)
 The patient is a 68 year old gentleman well-known to me.  We replaced his right hip due to significant severe arthritis back in May of last year.  Even then he had well-documented severe arthritis of his left hip.  He says the right hip is doing great.  His left hip has been hurting for over a year now and he is at the point where it is detrimentally affecting his mobility, his quality of life and his actives daily living.  He is back as a bus driver as well.  He does not have any significant medical issues and although he is under good control.  I reviewed all his past medical history and medications within epic.  He is not a diabetic.  His right hip again has done very well.  An AP pelvis and lateral of his right hip and left hip shows a well-seated total hip arthroplasty on the right side.  His left side has significant bone-on-bone arthritis.  There is complete loss of joint space with flattening of the femoral head and this has worsened when compared to films from a year ago.  His right operative hip moves smoothly and fluidly.  The left hip has almost essentially no internal and external rotation with pain in the groin with attempts or rotation.  Even his ADLs he said are significant limited as it relates to his left hip.  He is ready to be scheduled for a left total hip arthroplasty to treat his severe left hip arthritis.  Having had this before last on the right side he is fully aware of the risks and benefits of the surgery and what to expect from an intraoperative and postoperative standpoint.  He understands we will be in touch with scheduling the surgery.

## 2023-06-10 NOTE — Patient Instructions (Signed)
 SURGICAL WAITING ROOM VISITATION  Patients having surgery or a procedure may have no more than 2 support people in the waiting area - these visitors may rotate.    Children under the age of 7 must have an adult with them who is not the patient.  Due to an increase in RSV and influenza rates and associated hospitalizations, children ages 45 and under may not visit patients in [facility] hospitals.  Visitors with respiratory illnesses are discouraged from visiting and should remain at home.  If the patient needs to stay at the hospital during part of their recovery, the visitor guidelines for inpatient rooms apply. Pre-op nurse will coordinate an appropriate time for 1 support person to accompany patient in pre-op.  This support person may not rotate.    Please refer to the [facility] website for the visitor guidelines for Inpatients (after your surgery is over and you are in a regular room).       Your procedure is scheduled on: 06/14/23   Report to [facility] Main Entrance    Report to admitting at 5:15 AM   Call this number if you have problems the morning of surgery [phone]   Do not eat food :After Midnight.   After Midnight you may have the following liquids until 4:15 AM DAY OF SURGERY  Water Non-Citrus Juices (without pulp, NO RED-Apple, White grape, White cranberry) Black Coffee (NO MILK/CREAM OR CREAMERS, sugar ok)  Clear Tea (NO MILK/CREAM OR CREAMERS, sugar ok) regular and decaf                             Plain Jell-O (NO RED)                                           Fruit ices (not with fruit pulp, NO RED)                                     Popsicles (NO RED)                                                               Sports drinks like Gatorade (NO RED)                  The day of surgery:  Drink ONE (1) Pre-Surgery G2 at 4:15 AM the morning of surgery. Drink in one sitting. Do not sip.  This drink was given to you during your hospital   pre-op appointment visit. Nothing else to drink after completing the  Pre-Surgery G2.   Oral Hygiene is also important to reduce your risk of infection.                                    Remember - BRUSH YOUR TEETH THE MORNING OF SURGERY WITH YOUR REGULAR TOOTHPASTE  DENTURES WILL BE REMOVED PRIOR TO SURGERY PLEASE DO NOT APPLY "Poly grip" OR ADHESIVES!!!   Stop all vitamins and herbal supplements  7 days before surgery.   Take these medicines the morning of surgery with A SIP OF WATER: none              Do not take benazepril (Lotensin ) the morning of surgery.              You may not have any metal on your body including hair pins, jewelry, and body piercing             Do not wear make-up, lotions, powders, perfumes/cologne, or deodorant              Men may shave face and neck.   Do not bring valuables to the hospital. [facility] IS NOT             RESPONSIBLE   FOR VALUABLES.   Contacts, glasses, dentures or bridgework may not be worn into surgery.   Bring small overnight bag day of surgery.   DO NOT BRING YOUR HOME MEDICATIONS TO THE HOSPITAL. PHARMACY WILL DISPENSE MEDICATIONS LISTED ON YOUR MEDICATION LIST TO YOU DURING YOUR ADMISSION IN THE HOSPITAL!    Patients discharged on the day of surgery will not be allowed to drive home.  Someone NEEDS to stay with you for the first 24 hours after anesthesia.   Special Instructions: Bring a copy of your healthcare power of attorney and living will documents the day of surgery if you haven't scanned them before.              Please read over the following fact sheets you were given: IF YOU HAVE QUESTIONS ABOUT YOUR PRE-OP INSTRUCTIONS PLEASE CALL [phone] Andre [name]   If you received a COVID test during your pre-op visit  it is requested that you wear a mask when out in public, stay away from anyone that may not be feeling well and notify your surgeon if you develop symptoms. If you test positive for Covid or have been in  contact with anyone that has tested positive in the last 10 days please notify you surgeon.      Pre-operative 5 CHG Bath Instructions   You can play a key role in reducing the risk of infection after surgery. Your skin needs to be as free of germs as possible. You can reduce the number of germs on your skin by washing with CHG (chlorhexidine  gluconate) soap before surgery. CHG is an antiseptic soap that kills germs and continues to kill germs even after washing.   DO NOT use if you have an allergy to chlorhexidine /CHG or antibacterial soaps. If your skin becomes reddened or irritated, stop using the CHG and notify one of our RNs at [phone].   Please shower with the CHG soap starting 4 days before surgery using the following schedule:     Please keep in mind the following:  DO NOT shave, including legs and underarms, starting the day of your first shower.   You may shave your face at any point before/day of surgery.  Place clean sheets on your bed the day you start using CHG soap. Use a clean washcloth (not used since being washed) for each shower. DO NOT sleep with pets once you start using the CHG.   CHG Shower Instructions:  If you choose to wash your hair and private area, wash first with your normal shampoo/soap.  After you use shampoo/soap, rinse your hair and body thoroughly to remove shampoo/soap residue.  Turn the water OFF and apply about 3 tablespoons (45 ml) of  CHG soap to a CLEAN washcloth.  Apply CHG soap ONLY FROM YOUR NECK DOWN TO YOUR TOES (washing for 3-5 minutes)  DO NOT use CHG soap on face, private areas, open wounds, or sores.  Pay special attention to the area where your surgery is being performed.  If you are having back surgery, having someone wash your back for you may be helpful. Wait 2 minutes after CHG soap is applied, then you may rinse off the CHG soap.  Pat dry with a clean towel  Put on clean clothes/pajamas   If you choose to wear lotion, please  use ONLY the CHG-compatible lotions on the back of this paper.     Additional instructions for the day of surgery: DO NOT APPLY any lotions, deodorants, cologne, or perfumes.   Put on clean/comfortable clothes.  Brush your teeth.  Ask your nurse before applying any prescription medications to the skin.      CHG Compatible Lotions   Aveeno Moisturizing lotion  Cetaphil Moisturizing Cream  Cetaphil Moisturizing Lotion  Clairol Herbal Essence Moisturizing Lotion, Dry Skin  Clairol Herbal Essence Moisturizing Lotion, Extra Dry Skin  Clairol Herbal Essence Moisturizing Lotion, Normal Skin  Curel Age Defying Therapeutic Moisturizing Lotion with Alpha Hydroxy  Curel Extreme Care Body Lotion  Curel Soothing Hands Moisturizing Hand Lotion  Curel Therapeutic Moisturizing Cream, Fragrance-Free  Curel Therapeutic Moisturizing Lotion, Fragrance-Free  Curel Therapeutic Moisturizing Lotion, Original Formula  Eucerin Daily Replenishing Lotion  Eucerin Dry Skin Therapy Plus Alpha Hydroxy Crme  Eucerin Dry Skin Therapy Plus Alpha Hydroxy Lotion  Eucerin Original Crme  Eucerin Original Lotion  Eucerin Plus Crme Eucerin Plus Lotion  Eucerin TriLipid Replenishing Lotion  Keri Anti-Bacterial Hand Lotion  Keri Deep Conditioning Original Lotion Dry Skin Formula Softly Scented  Keri Deep Conditioning Original Lotion, Fragrance Free Sensitive Skin Formula  Keri Lotion Fast Absorbing Fragrance Free Sensitive Skin Formula  Keri Lotion Fast Absorbing Softly Scented Dry Skin Formula  Keri Original Lotion  Keri Skin Renewal Lotion Keri Silky Smooth Lotion  Keri Silky Smooth Sensitive Skin Lotion  Nivea Body Creamy Conditioning Oil  Nivea Body Extra Enriched Lotion  Nivea Body Original Lotion  Nivea Body Sheer Moisturizing Lotion Nivea Crme  Nivea Skin Firming Lotion  NutraDerm 30 Skin Lotion  NutraDerm Skin Lotion  NutraDerm Therapeutic Skin Cream  NutraDerm Therapeutic Skin Lotion  ProShield  Protective Hand Cream  Incentive Spirometer  An incentive spirometer is a tool that can help keep your lungs clear and active. This tool measures how well you are filling your lungs with each breath. Taking long deep breaths may help reverse or decrease the chance of developing breathing (pulmonary) problems (especially infection) following: A long period of time when you are unable to move or be active. BEFORE THE PROCEDURE  If the spirometer includes an indicator to show your best effort, your nurse or respiratory therapist will set it to a desired goal. If possible, sit up straight or lean slightly forward. Try not to slouch. Hold the incentive spirometer in an upright position. INSTRUCTIONS FOR USE  Sit on the edge of your bed if possible, or sit up as far as you can in bed or on a chair. Hold the incentive spirometer in an upright position. Breathe out normally. Place the mouthpiece in your mouth and seal your lips tightly around it. Breathe in slowly and as deeply as possible, raising the piston or the ball toward the top of the column. Hold your breath for 3-5 seconds  or for as long as possible. Allow the piston or ball to fall to the bottom of the column. Remove the mouthpiece from your mouth and breathe out normally. Rest for a few seconds and repeat Steps 1 through 7 at least 10 times every 1-2 hours when you are awake. Take your time and take a few normal breaths between deep breaths. The spirometer may include an indicator to show your best effort. Use the indicator as a goal to work toward during each repetition. After each set of 10 deep breaths, practice coughing to be sure your lungs are clear. If you have an incision (the cut made at the time of surgery), support your incision when coughing by placing a pillow or rolled up towels firmly against it. Once you are able to get out of bed, walk around indoors and cough well. You may stop using the incentive spirometer when instructed  by your caregiver.  RISKS AND COMPLICATIONS Take your time so you do not get dizzy or light-headed. If you are in pain, you may need to take or ask for pain medication before doing incentive spirometry. It is harder to take a deep breath if you are having pain. AFTER USE Rest and breathe slowly and easily. It can be helpful to keep track of a log of your progress. Your caregiver can provide you with a simple table to help with this. If you are using the spirometer at home, follow these instructions: SEEK MEDICAL CARE IF:  You are having difficultly using the spirometer. You have trouble using the spirometer as often as instructed. Your pain medication is not giving enough relief while using the spirometer. You develop fever of 100.5 F (38.1 C) or higher. SEEK IMMEDIATE MEDICAL CARE IF:  You cough up bloody sputum that had not been present before. You develop fever of 102 F (38.9 C) or greater. You develop worsening pain at or near the incision site. MAKE SURE YOU:  Understand these instructions. Will watch your condition. Will get help right away if you are not doing well or get worse.    WHAT IS A BLOOD TRANSFUSION? Blood Transfusion Information  A transfusion is the replacement of blood or some of its parts. Blood is made up of multiple cells which provide different functions. Red blood cells carry oxygen and are used for blood loss replacement. White blood cells fight against infection. Platelets control bleeding. Plasma helps clot blood. Other blood products are available for specialized needs, such as hemophilia or other clotting disorders. BEFORE THE TRANSFUSION  Who gives blood for transfusions?  Healthy volunteers who are fully evaluated to make sure their blood is safe. This is blood bank blood. Transfusion therapy is the safest it has ever been in the practice of medicine. Before blood is taken from a donor, a complete history is taken to make sure that person has no  history of diseases nor engages in risky social behavior (examples are intravenous drug use or sexual activity with multiple partners). The donor's travel history is screened to minimize risk of transmitting infections, such as malaria. The donated blood is tested for signs of infectious diseases, such as HIV and hepatitis. The blood is then tested to be sure it is compatible with you in order to minimize the chance of a transfusion reaction. If you or a relative donates blood, this is often done in anticipation of surgery and is not appropriate for emergency situations. It takes many days to process the donated blood. RISKS AND  COMPLICATIONS Although transfusion therapy is very safe and saves many lives, the main dangers of transfusion include:  Getting an infectious disease. Developing a transfusion reaction. This is an allergic reaction to something in the blood you were given. Every precaution is taken to prevent this. The decision to have a blood transfusion has been considered carefully by your caregiver before blood is given. Blood is not given unless the benefits outweigh the risks. AFTER THE TRANSFUSION Right after receiving a blood transfusion, you will usually feel much better and more energetic. This is especially true if your red blood cells have gotten low (anemic). The transfusion raises the level of the red blood cells which carry oxygen, and this usually causes an energy increase. The nurse administering the transfusion will monitor you carefully for complications. HOME CARE INSTRUCTIONS  No special instructions are needed after a transfusion. You may find your energy is better. Speak with your caregiver about any limitations on activity for underlying diseases you may have. SEEK MEDICAL CARE IF:  Your condition is not improving after your transfusion. You develop redness or irritation at the intravenous (IV) site. SEEK IMMEDIATE MEDICAL CARE IF:  Any of the following symptoms occur  over the next 12 hours: Shaking chills. You have a temperature by mouth above 102 F (38.9 C), not controlled by medicine. Chest, back, or muscle pain. People around you feel you are not acting correctly or are confused. Shortness of breath or difficulty breathing. Dizziness and fainting. You get a rash or develop hives. You have a decrease in urine output. Your urine turns a dark color or changes to pink, red, or brown. Any of the following symptoms occur over the next 10 days: You have a temperature by mouth above 102 F (38.9 C), not controlled by medicine. Shortness of breath. Weakness after normal activity. The white part of the eye turns yellow (jaundice). You have a decrease in the amount of urine or are urinating less often. Your urine turns a dark color or changes to pink, red, or brown. Document Released: 01/13/2000 Document Revised: 04/09/2011 Document Reviewed: 09/01/2007 [facility] Patient Information 2014 [place], [place].

## 2023-06-10 NOTE — Progress Notes (Signed)
 COVID Vaccine received:  []  No [x]  Yes Date of any COVID positive Test in last 90 days: Yes, February PCP - Claire Crick MD Cardiologist - n/a  Chest x-ray - 04/29/23 Epic EKG - 04/12/23 Epic  Stress Test -  ECHO -  Cardiac Cath -   Bowel Prep - [x]  No  []   Yes ______  Pacemaker / ICD device [x]  No []  Yes   Spinal Cord Stimulator:[x]  No []  Yes       History of Sleep Apnea? [x]  No []  Yes   CPAP used?- [x]  No []  Yes    Does the patient monitor blood sugar?          [x]  No []  Yes  []  N/A  Patient has: [x]  NO Hx DM   []  Pre-DM                 []  DM1  []   DM2 Does patient have a [employer] or Dexacom? []  No []  Yes   Fasting Blood Sugar Ranges-  Checks Blood Sugar _____ times a day  GLP1 agonist / usual dose - no GLP1 instructions:  SGLT-2 inhibitors / usual dose - no SGLT-2 instructions:   Blood Thinner / Instructions:no Aspirin  Instructions:no  Comments:   Activity level: Patient is able  to climb a flight of stairs without difficulty; [x]  No CP  [x]  No SOB, _   Patient can  perform ADLs without assistance.   Anesthesia review:   Patient denies shortness of breath, fever, cough and chest pain at PAT appointment.  Patient verbalized understanding and agreement to the Pre-Surgical Instructions that were given to them at this PAT appointment. Patient was also educated of the need to review these PAT instructions again prior to his/her surgery.I reviewed the appropriate phone numbers to call if they have any and questions or concerns.

## 2023-06-11 ENCOUNTER — Encounter (HOSPITAL_COMMUNITY)
Admission: RE | Admit: 2023-06-11 | Discharge: 2023-06-11 | Disposition: A | Discharge status: Home / Self Care | Source: Ambulatory Visit | Attending: Orthopaedic Surgery | Admitting: Orthopaedic Surgery

## 2023-06-11 ENCOUNTER — Encounter (HOSPITAL_COMMUNITY): Payer: Self-pay

## 2023-06-11 ENCOUNTER — Other Ambulatory Visit: Payer: Self-pay

## 2023-06-11 VITALS — BP 161/100 | HR 87 | Temp 98.3°F | Resp 16 | Ht 72.0 in | Wt 225.0 lb

## 2023-06-11 DIAGNOSIS — Z01818 Encounter for other preprocedural examination: Secondary | ICD-10-CM | POA: Diagnosis present

## 2023-06-11 DIAGNOSIS — M1611 Unilateral primary osteoarthritis, right hip: Secondary | ICD-10-CM | POA: Insufficient documentation

## 2023-06-11 DIAGNOSIS — M1612 Unilateral primary osteoarthritis, left hip: Secondary | ICD-10-CM

## 2023-06-11 LAB — CBC
HCT: 48 % (ref 39.0–52.0)
Hemoglobin: 15.6 g/dL (ref 13.0–17.0)
MCH: 28 pg (ref 26.0–34.0)
MCHC: 32.5 g/dL (ref 30.0–36.0)
MCV: 86.2 fL (ref 80.0–100.0)
Platelets: 271 10*3/uL (ref 150–400)
RBC: 5.57 MIL/uL (ref 4.22–5.81)
RDW: 14.1 % (ref 11.5–15.5)
WBC: 8.1 10*3/uL (ref 4.0–10.5)
nRBC: 0 % (ref 0.0–0.2)

## 2023-06-11 LAB — BASIC METABOLIC PANEL WITH GFR
Anion gap: 7 (ref 5–15)
BUN: 11 mg/dL (ref 8–23)
CO2: 26 mmol/L (ref 22–32)
Calcium: 9.1 mg/dL (ref 8.9–10.3)
Chloride: 102 mmol/L (ref 98–111)
Creatinine, Ser: 0.72 mg/dL (ref 0.61–1.24)
GFR, Estimated: >60 mL/min (ref >60)
Glucose, Bld: 109 mg/dL — ABNORMAL HIGH (ref 70–99)
Potassium: 3.8 mmol/L (ref 3.5–5.1)
Sodium: 135 mmol/L (ref 135–145)

## 2023-06-11 LAB — SURGICAL PCR SCREEN
MRSA, PCR: NEGATIVE
Staphylococcus aureus: POSITIVE — AB

## 2023-06-11 NOTE — Progress Notes (Signed)
 Request sent to Dr. Jannelle Memory to review pt's preop PCR result.

## 2023-06-13 NOTE — H&P (Signed)
 TOTAL HIP ADMISSION H&P  Patient is admitted for left total hip arthroplasty.  Subjective:  Chief Complaint: left hip pain  HPI: Andre [name], 68 y.o. male, has a history of pain and functional disability in the left hip(s) due to arthritis and patient has failed non-surgical conservative treatments for greater than 12 weeks to include NSAID's and/or analgesics, flexibility and strengthening excercises, use of assistive devices, weight reduction as appropriate, and activity modification.  Onset of symptoms was gradual starting 1 years ago with gradually worsening course since that time.The patient noted no past surgery on the left hip(s).  Patient currently rates pain in the left hip at 10 out of 10 with activity. Patient has night pain, worsening of pain with activity and weight bearing, trendelenberg gait, pain that interfers with activities of daily living, and pain with passive range of motion. Patient has evidence of subchondral cysts, subchondral sclerosis, periarticular osteophytes, and joint space narrowing by imaging studies. This condition presents safety issues increasing the risk of falls.  There is no current active infection.  Patient Active Problem List   Diagnosis Date Noted   Welcome to Medicare preventive visit 04/12/2023   Status post total replacement of right hip 06/01/2022   Unilateral primary osteoarthritis, left hip 03/15/2022   Crushing injury of left foot 01/08/2020   Advanced directives, counseling/discussion 11/04/2019   Obesity, Class I, BMI 30.0-34.9 (see actual BMI) 11/04/2019   Bilateral primary osteoarthritis of hip 09/02/2019   COVID-19 virus infection 03/02/2019   Erectile dysfunction 01/11/2018   Epidermal inclusion cyst 12/13/2014   Gross hematuria 05/03/2014   Achilles tendon pain 12/31/2013   Rosacea 04/20/2010   Health maintenance examination 04/20/2010   Prediabetes 09/06/2008   Hypertension 12/12/2006   HYPERCHOLESTEROLEMIA 11/25/2006   Past  Medical History:  Diagnosis Date   Arthritis    COVID-19 virus infection 01/2019   Essential hypertension, benign    History of colon polyps 2014   History of kidney stones    Nephrolithiasis 06/2014   ER visit   Pure hypercholesterolemia     Past Surgical History:  Procedure Laterality Date   COLONOSCOPY  01/2012   tubular adenoma Howard Macho), rpt 5 yrs   COLONOSCOPY  03/2017   hyperplastic polyp, int hem, rpt 5 yrs Howard Macho)   TONSILLECTOMY     as a child   TOTAL HIP ARTHROPLASTY Right 06/01/2022   Procedure: RIGHT TOTAL HIP ARTHROPLASTY ANTERIOR APPROACH;  Surgeon: Arnie Lao, MD;  Location: [facility];  Service: Orthopedics;  Laterality: Right;    No current facility-administered medications for this encounter.   Current Outpatient Medications  Medication Sig Dispense Refill Last Dose/Taking   benazepril  (LOTENSIN ) 20 MG tablet Take 1 tablet (20 mg total) by mouth daily. 90 tablet 4 Taking   Allergies  Allergen Reactions   Dust Mite Extract Itching    Itching eyes   Celebrex  [Celecoxib ] Other (See Comments)    palpitations    Social History   Tobacco Use   Smoking status: Never   Smokeless tobacco: Never  Substance Use Topics   Alcohol use: Not Currently    Alcohol/week: 3.0 standard drinks of alcohol    Types: 3 Glasses of wine per week    Family History  Problem Relation Age of Onset   Hypertension Mother    Heart disease Mother        enlarged heart   Colon cancer Sister 55   Breast cancer Sister 33   Diabetes Sister    Kidney disease  Sister    HIV Brother    Stomach cancer Neg Hx    CAD Neg Hx    Stroke Neg Hx      Review of Systems  Objective:  Physical Exam Vitals reviewed.  Constitutional:      Appearance: Normal appearance.  HENT:     Head: Normocephalic and atraumatic.  Eyes:     Extraocular Movements: Extraocular movements intact.     Pupils: Pupils are equal, round, and reactive to light.  Cardiovascular:     Rate and Rhythm:  Normal rate and regular rhythm.  Pulmonary:     Effort: Pulmonary effort is normal.     Breath sounds: Normal breath sounds.  Abdominal:     Palpations: Abdomen is soft.  Musculoskeletal:     Cervical back: Normal range of motion and neck supple.     Left hip: Tenderness and bony tenderness present. Decreased range of motion. Decreased strength.  Neurological:     Mental Status: He is alert and oriented to person, place, and time.  Psychiatric:        Behavior: Behavior normal.     Vital signs in last 24 hours:    Labs:   Estimated body mass index is 30.52 kg/m as calculated from the following:   Height as of 06/11/23: 6' (1.829 m).   Weight as of 06/11/23: 102.1 kg.   Imaging Review Plain radiographs demonstrate severe degenerative joint disease of the left hip(s). The bone quality appears to be excellent for age and reported activity level.      Assessment/Plan:  End stage arthritis, left hip(s)  The patient history, physical examination, clinical judgement of the provider and imaging studies are consistent with end stage degenerative joint disease of the left hip(s) and total hip arthroplasty is deemed medically necessary. The treatment options including medical management, injection therapy, arthroscopy and arthroplasty were discussed at length. The risks and benefits of total hip arthroplasty were presented and reviewed. The risks due to aseptic loosening, infection, stiffness, dislocation/subluxation,  thromboembolic complications and other imponderables were discussed.  The patient acknowledged the explanation, agreed to proceed with the plan and consent was signed. Patient is being admitted for inpatient treatment for surgery, pain control, PT, OT, prophylactic antibiotics, VTE prophylaxis, progressive ambulation and ADL's and discharge planning.The patient is planning to be discharged home with home health services

## 2023-06-14 ENCOUNTER — Ambulatory Visit (HOSPITAL_COMMUNITY): Admitting: Certified Registered Nurse Anesthetist

## 2023-06-14 ENCOUNTER — Ambulatory Visit (HOSPITAL_BASED_OUTPATIENT_CLINIC_OR_DEPARTMENT_OTHER): Admitting: Certified Registered Nurse Anesthetist

## 2023-06-14 ENCOUNTER — Ambulatory Visit (HOSPITAL_COMMUNITY)

## 2023-06-14 ENCOUNTER — Encounter (HOSPITAL_COMMUNITY): Payer: Self-pay | Admitting: Orthopaedic Surgery

## 2023-06-14 ENCOUNTER — Observation Stay (HOSPITAL_COMMUNITY)
Admission: RE | Admit: 2023-06-14 | Discharge: 2023-06-15 | Disposition: A | Discharge status: Home Health | Attending: Orthopaedic Surgery | Admitting: Orthopaedic Surgery

## 2023-06-14 ENCOUNTER — Observation Stay (HOSPITAL_COMMUNITY)

## 2023-06-14 ENCOUNTER — Other Ambulatory Visit: Payer: Self-pay

## 2023-06-14 ENCOUNTER — Encounter (HOSPITAL_COMMUNITY)
Admission: RE | Disposition: A | Discharge status: Home Health | Payer: Self-pay | Source: Home / Self Care | Attending: Orthopaedic Surgery

## 2023-06-14 DIAGNOSIS — M1612 Unilateral primary osteoarthritis, left hip: Principal | ICD-10-CM | POA: Diagnosis present

## 2023-06-14 DIAGNOSIS — Z8616 Personal history of COVID-19: Secondary | ICD-10-CM | POA: Insufficient documentation

## 2023-06-14 DIAGNOSIS — I10 Essential (primary) hypertension: Secondary | ICD-10-CM | POA: Insufficient documentation

## 2023-06-14 DIAGNOSIS — Z96641 Presence of right artificial hip joint: Secondary | ICD-10-CM | POA: Diagnosis not present

## 2023-06-14 DIAGNOSIS — Z96642 Presence of left artificial hip joint: Secondary | ICD-10-CM

## 2023-06-14 DIAGNOSIS — Z79899 Other long term (current) drug therapy: Secondary | ICD-10-CM | POA: Diagnosis not present

## 2023-06-14 HISTORY — PX: TOTAL HIP ARTHROPLASTY: SHX124

## 2023-06-14 LAB — TYPE AND SCREEN
ABO/RH(D): O POS
Antibody Screen: NEGATIVE

## 2023-06-14 SURGERY — ARTHROPLASTY, HIP, TOTAL, ANTERIOR APPROACH
Anesthesia: Spinal | Site: Hip | Laterality: Left

## 2023-06-14 MED ORDER — CHLORHEXIDINE GLUCONATE 4 % EX SOLN: 1.0000 | CUTANEOUS | 1 refills | Status: DC

## 2023-06-14 MED ORDER — PROPOFOL 500 MG/50ML IV EMUL
INTRAVENOUS | Status: DC | PRN
  Administered 2023-06-14: 75 ug/kg/min via INTRAVENOUS

## 2023-06-14 MED ORDER — PHENOL 1.4 % MT LIQD: 1.0000 | OROMUCOSAL | Status: DC | PRN

## 2023-06-14 MED ORDER — METOCLOPRAMIDE HCL 5 MG PO TABS: 5.0000 mg | ORAL_TABLET | Freq: Three times a day (TID) | ORAL | Status: DC | PRN

## 2023-06-14 MED ORDER — 0.9 % SODIUM CHLORIDE (POUR BTL) OPTIME
TOPICAL | Status: DC | PRN
  Administered 2023-06-14: 1000 mL

## 2023-06-14 MED ORDER — HYDROMORPHONE HCL 1 MG/ML IJ SOLN: 0.5000 mg | INTRAMUSCULAR | Status: DC | PRN

## 2023-06-14 MED ORDER — ACETAMINOPHEN 325 MG PO TABS: 325.0000 mg | ORAL_TABLET | Freq: Four times a day (QID) | ORAL | Status: DC | PRN

## 2023-06-14 MED ORDER — SODIUM CHLORIDE 0.9 % IR SOLN
Status: DC | PRN
  Administered 2023-06-14: 1000 mL

## 2023-06-14 MED ORDER — OXYCODONE HCL 5 MG PO TABS
5.0000 mg | ORAL_TABLET | ORAL | Status: DC | PRN
  Administered 2023-06-14 – 2023-06-15 (×4): 10 mg via ORAL
  Filled 2023-06-14 (×4): qty 2

## 2023-06-14 MED ORDER — DEXAMETHASONE SODIUM PHOSPHATE 10 MG/ML IJ SOLN
INTRAMUSCULAR | Status: DC | PRN
  Administered 2023-06-14: 5 mg via INTRAVENOUS

## 2023-06-14 MED ORDER — CEFAZOLIN SODIUM-DEXTROSE 2-4 GM/100ML-% IV SOLN
2.0000 g | INTRAVENOUS | Status: AC
Start: 2023-06-14 — End: 2023-06-14
  Administered 2023-06-14: 2 g via INTRAVENOUS
  Filled 2023-06-14: qty 100

## 2023-06-14 MED ORDER — MIDAZOLAM HCL 2 MG/2ML IJ SOLN
INTRAMUSCULAR | Status: AC
  Filled 2023-06-14: qty 2

## 2023-06-14 MED ORDER — FENTANYL CITRATE PF 50 MCG/ML IJ SOSY: 25.0000 ug | PREFILLED_SYRINGE | INTRAMUSCULAR | Status: DC | PRN

## 2023-06-14 MED ORDER — BENAZEPRIL HCL 20 MG PO TABS
20.0000 mg | ORAL_TABLET | Freq: Every day | ORAL | Status: DC
  Administered 2023-06-15: 20 mg via ORAL
  Filled 2023-06-14: qty 1

## 2023-06-14 MED ORDER — ASPIRIN 81 MG PO CHEW
81.0000 mg | CHEWABLE_TABLET | Freq: Two times a day (BID) | ORAL | Status: DC
  Administered 2023-06-14 – 2023-06-15 (×2): 81 mg via ORAL
  Filled 2023-06-14 (×2): qty 1

## 2023-06-14 MED ORDER — METHOCARBAMOL 500 MG PO TABS
500.0000 mg | ORAL_TABLET | Freq: Four times a day (QID) | ORAL | Status: DC | PRN
  Administered 2023-06-14 – 2023-06-15 (×2): 500 mg via ORAL
  Filled 2023-06-14 (×2): qty 1

## 2023-06-14 MED ORDER — MIDAZOLAM HCL 5 MG/5ML IJ SOLN
INTRAMUSCULAR | Status: DC | PRN
  Administered 2023-06-14: 2 mg via INTRAVENOUS

## 2023-06-14 MED ORDER — METHOCARBAMOL 1000 MG/10ML IJ SOLN: 500.0000 mg | Freq: Four times a day (QID) | INTRAMUSCULAR | Status: DC | PRN

## 2023-06-14 MED ORDER — ONDANSETRON HCL 4 MG/2ML IJ SOLN: 4.0000 mg | Freq: Four times a day (QID) | INTRAMUSCULAR | Status: DC | PRN

## 2023-06-14 MED ORDER — METOCLOPRAMIDE HCL 5 MG/ML IJ SOLN: 5.0000 mg | Freq: Three times a day (TID) | INTRAMUSCULAR | Status: DC | PRN

## 2023-06-14 MED ORDER — DEXAMETHASONE SODIUM PHOSPHATE 10 MG/ML IJ SOLN
INTRAMUSCULAR | Status: AC
  Filled 2023-06-14: qty 1

## 2023-06-14 MED ORDER — BUPIVACAINE IN DEXTROSE 0.75-8.25 % IT SOLN
INTRATHECAL | Status: DC | PRN
  Administered 2023-06-14: 1.8 mL via INTRATHECAL

## 2023-06-14 MED ORDER — TRANEXAMIC ACID-NACL 1000-0.7 MG/100ML-% IV SOLN
1000.0000 mg | INTRAVENOUS | Status: AC
  Administered 2023-06-14: 1000 mg via INTRAVENOUS
  Filled 2023-06-14: qty 100

## 2023-06-14 MED ORDER — OXYCODONE HCL 5 MG PO TABS
10.0000 mg | ORAL_TABLET | ORAL | Status: DC | PRN
  Administered 2023-06-15: 10 mg via ORAL
  Filled 2023-06-14: qty 2

## 2023-06-14 MED ORDER — PROPOFOL 1000 MG/100ML IV EMUL
INTRAVENOUS | Status: AC
  Filled 2023-06-14: qty 100

## 2023-06-14 MED ORDER — ONDANSETRON HCL 4 MG/2ML IJ SOLN: 4.0000 mg | Freq: Once | INTRAMUSCULAR | Status: DC | PRN

## 2023-06-14 MED ORDER — PROPOFOL 10 MG/ML IV BOLUS
INTRAVENOUS | Status: DC | PRN
  Administered 2023-06-14: 30 mg via INTRAVENOUS

## 2023-06-14 MED ORDER — LACTATED RINGERS IV SOLN: INTRAVENOUS | Status: DC

## 2023-06-14 MED ORDER — ONDANSETRON HCL 4 MG/2ML IJ SOLN
INTRAMUSCULAR | Status: DC | PRN
  Administered 2023-06-14: 4 mg via INTRAVENOUS

## 2023-06-14 MED ORDER — AMISULPRIDE (ANTIEMETIC) 5 MG/2ML IV SOLN: 10.0000 mg | Freq: Once | INTRAVENOUS | Status: DC | PRN

## 2023-06-14 MED ORDER — ONDANSETRON HCL 4 MG/2ML IJ SOLN
INTRAMUSCULAR | Status: AC
  Filled 2023-06-14: qty 2

## 2023-06-14 MED ORDER — DIPHENHYDRAMINE HCL 12.5 MG/5ML PO ELIX: 12.5000 mg | ORAL_SOLUTION | ORAL | Status: DC | PRN

## 2023-06-14 MED ORDER — PHENYLEPHRINE HCL-NACL 20-0.9 MG/250ML-% IV SOLN
INTRAVENOUS | Status: DC | PRN
  Administered 2023-06-14: 30 ug/min via INTRAVENOUS

## 2023-06-14 MED ORDER — GABAPENTIN 100 MG PO CAPS
100.0000 mg | ORAL_CAPSULE | Freq: Three times a day (TID) | ORAL | Status: DC
  Administered 2023-06-14 – 2023-06-15 (×3): 100 mg via ORAL
  Filled 2023-06-14 (×3): qty 1

## 2023-06-14 MED ORDER — MENTHOL 3 MG MT LOZG: 1.0000 | LOZENGE | OROMUCOSAL | Status: DC | PRN

## 2023-06-14 MED ORDER — MUPIROCIN 2 % EX OINT
1.0000 | TOPICAL_OINTMENT | Freq: Two times a day (BID) | CUTANEOUS | 0 refills | Status: AC
Start: 2023-06-14 — End: 2023-07-14

## 2023-06-14 MED ORDER — SODIUM CHLORIDE 0.9 % IV SOLN: INTRAVENOUS | Status: DC

## 2023-06-14 MED ORDER — PANTOPRAZOLE SODIUM 40 MG PO TBEC
40.0000 mg | DELAYED_RELEASE_TABLET | Freq: Every day | ORAL | Status: DC
  Administered 2023-06-15: 40 mg via ORAL
  Filled 2023-06-14: qty 1

## 2023-06-14 MED ORDER — ONDANSETRON HCL 4 MG PO TABS: 4.0000 mg | ORAL_TABLET | Freq: Four times a day (QID) | ORAL | Status: DC | PRN

## 2023-06-14 MED ORDER — CEFAZOLIN SODIUM-DEXTROSE 2-4 GM/100ML-% IV SOLN
2.0000 g | Freq: Four times a day (QID) | INTRAVENOUS | Status: AC
  Administered 2023-06-14 (×2): 2 g via INTRAVENOUS
  Filled 2023-06-14 (×2): qty 100

## 2023-06-14 MED ORDER — DOCUSATE SODIUM 100 MG PO CAPS
100.0000 mg | ORAL_CAPSULE | Freq: Two times a day (BID) | ORAL | Status: DC
  Administered 2023-06-14 – 2023-06-15 (×2): 100 mg via ORAL
  Filled 2023-06-14 (×2): qty 1

## 2023-06-14 MED ORDER — STERILE WATER FOR IRRIGATION IR SOLN
Status: DC | PRN
  Administered 2023-06-14: 1000 mL

## 2023-06-14 MED ORDER — ALUM & MAG HYDROXIDE-SIMETH 200-200-20 MG/5ML PO SUSP: 30.0000 mL | ORAL | Status: DC | PRN

## 2023-06-14 MED ORDER — ORAL CARE MOUTH RINSE: 15.0000 mL | Freq: Once | OROMUCOSAL | Status: AC

## 2023-06-14 MED ORDER — POVIDONE-IODINE 10 % EX SWAB
2.0000 | Freq: Once | CUTANEOUS | Status: AC
  Administered 2023-06-14: 2 via TOPICAL

## 2023-06-14 MED ORDER — CHLORHEXIDINE GLUCONATE 0.12 % MT SOLN
15.0000 mL | Freq: Once | OROMUCOSAL | Status: AC
  Administered 2023-06-14: 15 mL via OROMUCOSAL

## 2023-06-14 MED ORDER — ACETAMINOPHEN 10 MG/ML IV SOLN: 1000.0000 mg | Freq: Once | INTRAVENOUS | Status: DC | PRN

## 2023-06-14 SURGICAL SUPPLY — 38 items
BAG COUNTER SPONGE SURGICOUNT (BAG) ×1 IMPLANT
BAG ZIPLOCK 12X15 (MISCELLANEOUS) IMPLANT
BENZOIN TINCTURE PRP APPL 2/3 (GAUZE/BANDAGES/DRESSINGS) IMPLANT
BLADE SAW SGTL 18X1.27X75 (BLADE) ×1 IMPLANT
COVER PERINEAL POST (MISCELLANEOUS) ×1 IMPLANT
COVER SURGICAL LIGHT HANDLE (MISCELLANEOUS) ×1 IMPLANT
CUP ACET PINNACLE SECTR 60MM (Hips) IMPLANT
DRAPE FOOT SWITCH (DRAPES) ×1 IMPLANT
DRAPE STERI IOBAN 125X83 (DRAPES) ×1 IMPLANT
DRAPE U-SHAPE 47X51 STRL (DRAPES) ×2 IMPLANT
DRSG AQUACEL AG ADV 3.5X10 (GAUZE/BANDAGES/DRESSINGS) ×1 IMPLANT
DURAPREP 26ML APPLICATOR (WOUND CARE) ×1 IMPLANT
ELECT PENCIL ROCKER SW 15FT (MISCELLANEOUS) ×1 IMPLANT
ELECT REM PT RETURN 15FT ADLT (MISCELLANEOUS) ×1 IMPLANT
FACESHIELD WRAPAROUND (MASK) ×5 IMPLANT
FACESHIELD WRAPAROUND OR TEAM (MASK) IMPLANT
GAUZE XEROFORM 1X8 LF (GAUZE/BANDAGES/DRESSINGS) IMPLANT
GLOVE BIO SURGEON STRL SZ7.5 (GLOVE) ×1 IMPLANT
GLOVE BIOGEL PI IND STRL 8 (GLOVE) ×2 IMPLANT
GLOVE ECLIPSE 8.0 STRL XLNG CF (GLOVE) ×1 IMPLANT
GOWN STRL REUS W/ TWL XL LVL3 (GOWN DISPOSABLE) ×2 IMPLANT
HEAD CERAMIC 36 PLUS5 (Hips) IMPLANT
HOLDER FOLEY CATH W/STRAP (MISCELLANEOUS) ×1 IMPLANT
KIT TURNOVER KIT A (KITS) IMPLANT
LINER NEUTRAL 58X36MM PLUS4 IMPLANT
PACK ANTERIOR HIP CUSTOM (KITS) ×1 IMPLANT
SET HNDPC FAN SPRY TIP SCT (DISPOSABLE) ×1 IMPLANT
STAPLER SKIN PROX 35W (STAPLE) IMPLANT
STEM FEM ACTIS HIGH SZ8 (Stem) IMPLANT
STRIP CLOSURE SKIN 1/2X4 (GAUZE/BANDAGES/DRESSINGS) IMPLANT
SUT ETHIBOND NAB CT1 #1 30IN (SUTURE) ×1 IMPLANT
SUT ETHILON 2 0 PS N (SUTURE) IMPLANT
SUT MNCRL AB 4-0 PS2 18 (SUTURE) IMPLANT
SUT VIC AB 0 CT1 36 (SUTURE) ×1 IMPLANT
SUT VIC AB 1 CT1 36 (SUTURE) ×1 IMPLANT
SUT VIC AB 2-0 CT1 TAPERPNT 27 (SUTURE) ×2 IMPLANT
TRAY FOLEY MTR SLVR 16FR STAT (SET/KITS/TRAYS/PACK) IMPLANT
YANKAUER SUCT BULB TIP NO VENT (SUCTIONS) ×1 IMPLANT

## 2023-06-14 NOTE — Interval H&P Note (Signed)
 History and Physical Interval Note: The patient understands that he is here today for a left total hip replacement to treat his significant left hip arthritis and pain.  There has been no acute or interval change in his medical status.  The risks and benefits of surgery have been discussed in detail and informed consent has been obtained.  The left operative hip has been marked.  06/14/2023 6:55 AM  Diannia Forward  has presented today for surgery, with the diagnosis of Osteoarthritis Left Hip.  The various methods of treatment have been discussed with the patient and family. After consideration of risks, benefits and other options for treatment, the patient has consented to  Procedure(s): ARTHROPLASTY, HIP, TOTAL, ANTERIOR APPROACH (Left) as a surgical intervention.  The patient's history has been reviewed, patient examined, no change in status, stable for surgery.  I have reviewed the patient's chart and labs.  Questions were answered to the patient's satisfaction.     Arnie Lao

## 2023-06-14 NOTE — Evaluation (Signed)
 Physical Therapy Evaluation Patient Details Name: Andre [name] MRN: [id number] DOB: 02-May-1955 Today's Date: 06/14/2023  History of Present Illness  68 yo male presents to therapy s/p L THA, anterior approach on 06/14/2023 due to failure of conservative measures. Pt PMH includes but is not limited to: HTN, HLD, L foot injury an dR THA, AA (05/2022).  Clinical Impression    Andre [name] is a 68 y.o. male POD 0 s/p L THA. Patient reports IND with mobility at baseline. Patient is now limited by functional impairments (see PT problem list below) and requires CGA for bed mobility and CGA and cues for transfers. Patient was able to ambulate 50 feet with RW and CGA level of assist. Patient will benefit from continued skilled PT interventions to address impairments and progress towards PLOF. Acute PT will follow to progress mobility and stair training in preparation for safe discharge home with family support and [facility] services.       If plan is discharge home, recommend the following: A little help with walking and/or transfers;A little help with bathing/dressing/bathroom;Assistance with cooking/housework;Assist for transportation;Help with stairs or ramp for entrance   Can travel by private vehicle        Equipment Recommendations Rolling walker (2 wheels)  Recommendations for Other Services       Functional Status Assessment Patient has had a recent decline in their functional status and demonstrates the ability to make significant improvements in function in a reasonable and predictable amount of time.     Precautions / Restrictions Precautions Precautions: Fall Restrictions Weight Bearing Restrictions Per Provider Order: No      Mobility  Bed Mobility Overal bed mobility: Needs Assistance Bed Mobility: Supine to Sit     Supine to sit: Contact guard, HOB elevated, Used rails     General bed mobility comments: min cues    Transfers Overall transfer level: Needs  assistance Equipment used: Rolling walker (2 wheels) Transfers: Sit to/from Stand Sit to Stand: Contact guard assist           General transfer comment: min cues for push to stand    Ambulation/Gait Ambulation/Gait assistance: Contact guard assist Gait Distance (Feet): 50 Feet Assistive device: Rolling walker (2 wheels) Gait Pattern/deviations: Step-to pattern, Antalgic, Trunk flexed Gait velocity: decreased     General Gait Details: slight trunk flexion with B UE support at RW to offload L LE in stance phase, min cues for safety and sequencing  Stairs            Wheelchair Mobility     Tilt Bed    Modified Rankin (Stroke Patients Only)       Balance Overall balance assessment: Needs assistance Sitting-balance support: Feet supported Sitting balance-Leahy Scale: Good     Standing balance support: Bilateral upper extremity supported, During functional activity, Reliant on assistive device for balance Standing balance-Leahy Scale: Fair                               Pertinent Vitals/Pain Pain Assessment Pain Assessment: 0-10 Pain Score: 7  Pain Location: L hip and LE Pain Descriptors / Indicators: Aching, Constant, Discomfort, Dull, Operative site guarding Pain Intervention(s): Limited activity within patient's tolerance, Monitored during session, Premedicated before session, Repositioned, Ice applied    Home Living Family/patient expects to be discharged to:: Private residence Living Arrangements: Spouse/significant other Available Help at Discharge: Family Type of Home: House Home Access: Stairs to enter Entrance Stairs-Rails: None  Entrance Stairs-Number of Steps: 2 Alternate Level Stairs-Number of Steps: flight Home Layout: Two level Home Equipment: Cane - single point Additional Comments: pt and spouse report able to use a RW from a freind for last surgery    Prior Function Prior Level of Function : Independent/Modified  Independent;History of Falls (last six months);Working/employed             Mobility Comments: IND no AD for all ADLs self care tasks and IADLs       Extremity/Trunk Assessment        Lower Extremity Assessment Lower Extremity Assessment: LLE deficits/detail LLE Deficits / Details: ankle DF/PF 5/5 LLE Sensation: WNL    Cervical / Trunk Assessment Cervical / Trunk Assessment: Normal  Communication   Communication Communication: No apparent difficulties    Cognition Arousal: Alert Behavior During Therapy: WFL for tasks assessed/performed   PT - Cognitive impairments: No apparent impairments                         Following commands: Intact       Cueing       General Comments      Exercises     Assessment/Plan    PT Assessment Patient needs continued PT services  PT Problem List Decreased strength;Decreased range of motion;Decreased activity tolerance;Decreased balance;Decreased mobility;Decreased coordination;Pain       PT Treatment Interventions DME instruction;Gait training;Stair training;Functional mobility training;Therapeutic activities;Therapeutic exercise;Balance training;Neuromuscular re-education;Patient/family education;Modalities    PT Goals (Current goals can be found in the Care Plan section)  Acute Rehab PT Goals Patient Stated Goal: to be able to get back to work and retire in 3 yrs PT Goal Formulation: With patient Time For Goal Achievement: 06/28/23 Potential to Achieve Goals: Good    Frequency 7X/week     Co-evaluation               AM-PAC PT "6 Clicks" Mobility  Outcome Measure Help needed turning from your back to your side while in a flat bed without using bedrails?: None Help needed moving from lying on your back to sitting on the side of a flat bed without using bedrails?: A Little Help needed moving to and from a bed to a chair (including a wheelchair)?: A Little Help needed standing up from a chair using  your arms (e.g., wheelchair or bedside chair)?: A Little Help needed to walk in hospital room?: A Little Help needed climbing 3-5 steps with a railing? : A Lot 6 Click Score: 18    End of Session Equipment Utilized During Treatment: Gait belt Activity Tolerance: Patient tolerated treatment well;No increased pain Patient left: in chair;with call bell/phone within reach;with family/visitor present Nurse Communication: Mobility status PT Visit Diagnosis: Unsteadiness on feet (R26.81);Muscle weakness (generalized) (M62.81);Other abnormalities of gait and mobility (R26.89);Difficulty in walking, not elsewhere classified (R26.2);Pain Pain - Right/Left: Left Pain - part of body: Hip;Leg    Time: 1610-9604 PT Time Calculation (min) (ACUTE ONLY): 30 min   Charges:   PT Evaluation $PT Eval Low Complexity: 1 Low PT Treatments $Gait Training: 8-22 mins PT General Charges $$ ACUTE PT VISIT: 1 Visit         Cary Clarks, PT Acute Rehab   Annalee Kiang 06/14/2023, 6:21 PM

## 2023-06-14 NOTE — Anesthesia Postprocedure Evaluation (Signed)
 Anesthesia Post Note  Patient: Andre [name]  Procedure(s) Performed: ARTHROPLASTY, HIP, TOTAL, ANTERIOR APPROACH (Left: Hip)     Patient location during evaluation: PACU Anesthesia Type: Spinal Level of consciousness: awake Pain management: pain level controlled Vital Signs Assessment: post-procedure vital signs reviewed and stable Respiratory status: spontaneous breathing, nonlabored ventilation and respiratory function stable Cardiovascular status: blood pressure returned to baseline and stable Postop Assessment: no apparent nausea or vomiting Anesthetic complications: no   No notable events documented.  Last Vitals:  Vitals:   06/14/23 1010 06/14/23 1234  BP: 115/78 (!) 148/87  Pulse: 73 89  Resp:  18  Temp: 36.5 C 36.5 C  SpO2: 96% 94%    Last Pain:  Vitals:   06/14/23 1527  TempSrc:   PainSc: 8                  [name] P [name]

## 2023-06-14 NOTE — Op Note (Signed)
 Operative Note  Date of operation: 06/14/2023 Preoperative diagnosis: Left hip primary osteoarthritis Postoperative diagnosis: Same  Procedure: Left direct anterior total hip arthroplasty  Implants: Implant Name Type Inv. Item Serial No. Manufacturer Lot No. LRB No. Used Action  CUP ACET PINNACLE SECTR - [id number] Hips CUP ACET PINNACLE SECTR  [employer]PU[employer] [id number] Left 1 Implanted  LINER NEUTRAL 58X36MM PLUS4 - [id number]  LINER NEUTRAL 58X36MM PLUS4  DEPUY ORTH[employer] [id number] Left 1 Implanted  STEM FEM ACTIS HIGH SZ8 - [id number] Stem STEM FEM ACTIS HIGH SZ8  DEPU[employer]TH[employer] [id number] Left 1 Implanted  HEAD CERAMIC 36 PLUS5 - [id number] Hips HEAD CERAMIC 36 PLUS5  DEPUY [employer]TH[employer] [id number] Left 1 Implanted   Surgeon: Jeanella Milan. 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  We actually replaced his right hip in the last year to 2 years due to severe arthritis in the right hip.  He now presents with severe arthritis in the left hip and in need of a hip replacement left side.  Having had this before he is fully aware of the risks of acute blood loss anemia, nerve vessel injury, fracture, infection, DVT, implant failure, dislocation, leg length differences and wound healing issues.  He understands that our goals are hopefully decreased pain, improved mobility and improved quality of life.  Procedure description: After informed consent was obtained and the appropriate left hip was marked, the patient was brought to the operating room and set up on the stretcher where spinal anesthesia was obtained.  He was laid in supine position on stretcher and a Foley catheter was placed.  Traction boots were placed on both his feet and next he was placed supine on the Hana fracture table with a  perineal post in place and both legs in inline skeletal traction vices no traction applied.  His left operative hip and pelvis were assessed radiographically.  The left hip was prepped and draped with DuraPrep and sterile drapes.  A timeout was called and he was identified as the correct patient and correct the left hip.  An incision was then made just inferior and posterior to the ASIS and carried slightly obliquely down the leg.  Dissection was carried down to the tensor fascia lata muscle and the tensor fascia was then divided longitudinally to proceed with a direct interposed the hip.  Circumflex vessels were identified and cauterized.  The hip capsule was identified and opened up in L-type format.  Cobra retractors were placed around the medial and lateral femoral neck and a femoral neck cut was made with oscillating saw just proximal to the lesser trochanter and this cut was completed with an osteotome.  A corkscrew guide was placed in the femoral head and the femoral head was removed its entirety and it was a large femoral head that was completely devoid of cartilage and slight flattening.  A bent Hohmann was then placed over the medial acetabular rim and remnants of the acetabular labrum and other debris including osteophytes removed.  Reaming was then initiated from a size 43 reamer and stepwise increments going up to a size 59 reamer with all reamers placed under direct visualization and the last reamer also placed under direct fluoroscopy in order to obtain the depth of reaming, the inclination and the anteversion.  The real DePuy sector GRIPTION acetabular component size 60 was  then placed without difficulty followed by a 36+4 polythene liner.  Attention was then turned to the femur.  With the left leg externally rotated to 120 degrees, extended and adducted, a Mueller retractor was placed medially and a Hohmann retractor behind the greater trochanter.  The lateral joint capsule was released and a box  cutting osteotome was used in the femoral canal.  Broaching was initiated using the Actis broaching system going from a size 0 up to a size 8.  With a size 8 in place we trialed a standard offset femoral neck and a 36+1.5 trial head ball.  This reduced in the pelvis and we felt like we needed just a little bit more leg length.  We dislocated the hip remove the trial components.  We then placed the real Actis femoral component size 8 with high offset and the real 36+5 ceramic head ball and again reduces in the pelvis.  We are pleased with stability as well as assessment of leg length and offset radiographically and mechanically.  The soft tissue was then irrigated with normal saline solution using pulsatile lavage.  Remnants of the joint capsule were closed with interrupted #1 Ethibond suture.  #1 Vicryl was used to close the tensor fascia.  0 Vicryl was used to close the deep tissue and 2-0 Vicryl was used to close subcutaneous tissue.  The skin was closed with staples.  An Aquacel dressing was applied.  The patient was taken off of the Hana table and taken to the recovery room.  Malena Scull, PA-C did assist during the entire case and beginning to him and his assistance was crucial and medically necessary for soft tissue management and retraction, helping guide implant placement and a layered closure of the wound.

## 2023-06-14 NOTE — Anesthesia Procedure Notes (Signed)
 Procedure Name: MAC Date/Time: 06/14/2023 7:17 AM  Performed by: Rochell Chroman, CRNAPre-anesthesia Checklist: Patient identified, Emergency Drugs available, Suction available and Patient being monitored Patient Re-evaluated:Patient Re-evaluated prior to induction Oxygen Delivery Method: Nasal cannula

## 2023-06-14 NOTE — Anesthesia Procedure Notes (Signed)
 Spinal  Patient location during procedure: OR Start time: 06/14/2023 7:07 AM End time: 06/14/2023 7:12 AM Reason for block: surgical anesthesia Staffing Performed: anesthesiologist  Anesthesiologist: Peggi Bowels, MD Performed by: Peggi Bowels, MD Authorized by: Peggi Bowels, MD   Preanesthetic Checklist Completed: patient identified, IV checked, risks and benefits discussed, surgical consent, monitors and equipment checked, pre-op evaluation and timeout performed Spinal Block Patient position: sitting Prep: DuraPrep Patient monitoring: cardiac monitor, continuous pulse ox and blood pressure Approach: midline Location: L3-4 Injection technique: single-shot Needle Needle type: Pencan  Needle gauge: 24 G Needle length: 9 cm Assessment Sensory level: T10 Events: CSF return Additional Notes Functioning IV was confirmed and monitors were applied. Sterile prep and drape, including hand hygiene and sterile gloves were used. The patient was positioned and the spine was prepped. The skin was anesthetized with lidocaine .  Free flow of clear CSF was obtained prior to injecting local anesthetic into the CSF.  The spinal needle aspirated freely following injection.  The needle was carefully withdrawn.  The patient tolerated the procedure well.

## 2023-06-14 NOTE — Transfer of Care (Signed)
 Immediate Anesthesia Transfer of Care Note  Patient: Andre [name]  Procedure(s) Performed: ARTHROPLASTY, HIP, TOTAL, ANTERIOR APPROACH (Left: Hip)  Patient Location: PACU  Anesthesia Type:Spinal  Level of Consciousness: awake  Airway & Oxygen Therapy: Patient Spontanous Breathing and Patient connected to face mask oxygen  Post-op Assessment: Report given to RN and Post -op Vital signs reviewed and stable  Post vital signs: Reviewed and stable  Last Vitals:  Vitals Value Taken Time  BP 101/78 06/14/23 0850  Temp    Pulse 80 06/14/23 0852  Resp 16 06/14/23 0852  SpO2 100 % 06/14/23 0852  Vitals shown include unfiled device data.  Last Pain:  Vitals:   06/14/23 0614  TempSrc:   PainSc: 0-No pain         Complications: No notable events documented.

## 2023-06-14 NOTE — Progress Notes (Signed)
 Pt standing and in pain, recheck of vitals once patient is settled

## 2023-06-14 NOTE — Plan of Care (Signed)
   Problem: Activity: Goal: Risk for activity intolerance will decrease Outcome: Progressing   Problem: Nutrition: Goal: Adequate nutrition will be maintained Outcome: Progressing   Problem: Pain Managment: Goal: General experience of comfort will improve and/or be controlled Outcome: Progressing   Problem: Safety: Goal: Ability to remain free from injury will improve Outcome: Progressing

## 2023-06-14 NOTE — Anesthesia Preprocedure Evaluation (Addendum)
 Anesthesia Evaluation  Patient identified by MRN, date of birth, ID band Patient awake    Reviewed: Allergy & Precautions, NPO status , Patient's Chart, lab work & pertinent test results  Airway Mallampati: II  TM Distance: >3 FB Neck ROM: Full    Dental  (+) Missing, Chipped,    Pulmonary neg pulmonary ROS   Pulmonary exam normal        Cardiovascular hypertension, Pt. on medications Normal cardiovascular exam     Neuro/Psych negative neurological ROS  negative psych ROS   GI/Hepatic negative GI ROS, Neg liver ROS,,,  Endo/Other  negative endocrine ROS    Renal/GU Renal disease     Musculoskeletal  (+) Arthritis ,    Abdominal  (+) + obese  Peds  Hematology Platelets: 271k          Anesthesia Other Findings Osteoarthritis Left Hip  Reproductive/Obstetrics                             Anesthesia Physical Anesthesia Plan  ASA: 2  Anesthesia Plan: Spinal   Post-op Pain Management:    Induction:   PONV Risk Score and Plan: 2 and Ondansetron , Dexamethasone , Midazolam  and Treatment may vary due to age or medical condition  Airway Management Planned: Simple Face Mask  Additional Equipment:   Intra-op Plan:   Post-operative Plan:   Informed Consent: I have reviewed the patients History and Physical, chart, labs and discussed the procedure including the risks, benefits and alternatives for the proposed anesthesia with the patient or authorized representative who has indicated his/her understanding and acceptance.     Dental advisory given  Plan Discussed with: CRNA  Anesthesia Plan Comments:        Anesthesia Quick Evaluation

## 2023-06-15 DIAGNOSIS — M1612 Unilateral primary osteoarthritis, left hip: Secondary | ICD-10-CM | POA: Diagnosis not present

## 2023-06-15 LAB — BASIC METABOLIC PANEL WITH GFR
Anion gap: 8 (ref 5–15)
BUN: 12 mg/dL (ref 8–23)
CO2: 26 mmol/L (ref 22–32)
Calcium: 8.8 mg/dL — ABNORMAL LOW (ref 8.9–10.3)
Chloride: 100 mmol/L (ref 98–111)
Creatinine, Ser: 0.88 mg/dL (ref 0.61–1.24)
GFR, Estimated: >60 mL/min (ref >60)
Glucose, Bld: 147 mg/dL — ABNORMAL HIGH (ref 70–99)
Potassium: 4.2 mmol/L (ref 3.5–5.1)
Sodium: 134 mmol/L — ABNORMAL LOW (ref 135–145)

## 2023-06-15 LAB — CBC
HCT: 41 % (ref 39.0–52.0)
Hemoglobin: 13.2 g/dL (ref 13.0–17.0)
MCH: 28 pg (ref 26.0–34.0)
MCHC: 32.2 g/dL (ref 30.0–36.0)
MCV: 87 fL (ref 80.0–100.0)
Platelets: 234 10*3/uL (ref 150–400)
RBC: 4.71 MIL/uL (ref 4.22–5.81)
RDW: 14.1 % (ref 11.5–15.5)
WBC: 12 10*3/uL — ABNORMAL HIGH (ref 4.0–10.5)
nRBC: 0 % (ref 0.0–0.2)

## 2023-06-15 MED ORDER — OXYCODONE HCL 5 MG PO TABS
5.0000 mg | ORAL_TABLET | Freq: Four times a day (QID) | ORAL | 0 refills | Status: DC | PRN
Start: 2023-06-15 — End: 2023-07-22

## 2023-06-15 MED ORDER — METHOCARBAMOL 500 MG PO TABS: 500.0000 mg | ORAL_TABLET | Freq: Four times a day (QID) | ORAL | 1 refills | Status: DC | PRN

## 2023-06-15 MED ORDER — ASPIRIN 81 MG PO CHEW: 81.0000 mg | CHEWABLE_TABLET | Freq: Two times a day (BID) | ORAL | 0 refills | Status: DC

## 2023-06-15 NOTE — TOC Transition Note (Signed)
 Transition of Care [facility]) - Discharge Note   Patient Details  Name: Andre [name] MRN: [id number] Date of Birth: [birth year]  Transition of Care [facility] CM/SW Contact:  Jonni Nettle, LCSW Phone Number: 06/15/2023, 10:05 AM   Clinical Narrative:    CSW met with pt at bedside to discuss discharge plans. CSW confirmed plan for [facility] PT with [facility] upon discharge. Pt reports need for RW; pt has [employer]. CSW made referral for RW through [employer] with representative, Aida. RW to be delivered to bedside prior to discharge. No further TOC needs at this time.   Final next level of care: Home w Home Health Services Barriers to Discharge: No Barriers Identified   Patient Goals and CMS Choice Patient states their goals for this hospitalization and ongoing recovery are:: To return home CMS Medicare.gov Compare Post Acute Care list provided to:: Patient Choice offered to / list presented to : Patient    Discharge Placement Home with [facility]  Discharge Plan and Services Additional resources added to the After Visit Summary for [facility] services through [facility] and [employer] for DME needs          DME Arranged: Walker rolling DME Agency: [employer] Date DME Agency Contacted: 06/15/23 Time DME Agency Contacted: [phone] Representative spoke with at DME Agency: Aida HH Arranged: PT HH Agency: [facility]     Representative spoke with at [facility] Agency: Pre-arranged in Ortho office  Social Drivers of Health (SDOH) Interventions SDOH Screenings   Food Insecurity: No Food Insecurity (06/14/2023)  Housing: Low Risk  (06/14/2023)  Transportation Needs: No Transportation Needs (06/14/2023)  Utilities: Not At Risk (06/14/2023)  Alcohol Screen: Low Risk  (04/12/2023)  Depression (PHQ2-9): Low Risk  (04/12/2023)  Financial Resource Strain: Low Risk  (04/12/2023)  Physical Activity: Insufficiently Active (04/12/2023)  Social Connections: Socially Integrated (06/14/2023)  Stress: No Stress Concern Present  (04/12/2023)  Tobacco Use: Low Risk  (06/14/2023)  Health Literacy: Adequate Health Literacy (04/12/2023)     Readmission Risk Interventions     No data to display          [place], MSW, LCSW 06/15/2023 10:09 AM

## 2023-06-15 NOTE — Plan of Care (Signed)

## 2023-06-15 NOTE — Progress Notes (Signed)
 Physical Therapy Treatment Patient Details Name: Andre [name] MRN: [id number] DOB: October 21, 1955 Today's Date: 06/15/2023   History of Present Illness 68 yo male presents to therapy s/p L THA, anterior approach on 06/14/2023 due to failure of conservative measures. Pt PMH includes but is not limited to: HTN, HLD, L foot injury an dR THA, AA (05/2022).    PT Comments  Pt in recliner when PT arrives, motivated and ready to complete mobility, states that he has been up to use the bathroom this morning and his leg pain inc when he moves/weight bears, but tolerable. Pt maintains supervision A for all tasks including Sit to Stand, amb, and stair negotiation. Cued for sequencing with the stairs and able to demonstrate recall and safety with tasks. HEP reviewed and completed with pt.    If plan is discharge home, recommend the following: A little help with walking and/or transfers;A little help with bathing/dressing/bathroom;Assistance with cooking/housework;Assist for transportation;Help with stairs or ramp for entrance   Can travel by private vehicle        Equipment Recommendations  Rolling walker (2 wheels) (delivered)    Recommendations for Other Services       Precautions / Restrictions Precautions Precautions: Fall Recall of Precautions/Restrictions: Intact Restrictions Weight Bearing Restrictions Per Provider Order: Yes LLE Weight Bearing Per Provider Order: Weight bearing as tolerated     Mobility  Bed Mobility               General bed mobility comments: in recliner when PT arrives    Transfers Overall transfer level: Needs assistance Equipment used: Rolling walker (2 wheels) Transfers: Sit to/from Stand Sit to Stand: Supervision                Ambulation/Gait Ambulation/Gait assistance: Supervision Gait Distance (Feet): 125 Feet Assistive device: Rolling walker (2 wheels) Gait Pattern/deviations: Antalgic, Trunk flexed, Step-through pattern, Decreased step  length - left Gait velocity: decreased     General Gait Details: Pt initiates step through pattern, asymmetrical stride length, improves with distance   Stairs Stairs: Yes Stairs assistance: Supervision Stair Management: One rail Left, Step to pattern Number of Stairs: 10 General stair comments: non reciprocal negotiation of stairs, completes 10 to mimic flight at home and completes 2 steps to simulate home entry/exit. Well tolerated   Wheelchair Mobility     Tilt Bed    Modified Rankin (Stroke Patients Only)       Balance Overall balance assessment: Needs assistance Sitting-balance support: Feet supported Sitting balance-Leahy Scale: Good     Standing balance support: During functional activity, Reliant on assistive device for balance, No upper extremity supported Standing balance-Leahy Scale: Fair                              Communication    Cognition Arousal: Alert Behavior During Therapy: WFL for tasks assessed/performed   PT - Cognitive impairments: No apparent impairments                                Cueing    Exercises Total Joint Exercises Ankle Circles/Pumps: AROM, Both, 10 reps Quad Sets: AROM, Left, 10 reps Heel Slides: 10 reps, Left, AAROM Hip ABduction/ADduction: AAROM, 10 reps, Left Long Arc Quad: AROM, 10 reps, Left Knee Flexion: AROM, 10 reps, Left, Standing Marching in Standing: AROM, 10 reps, Left Standing Hip Extension: AROM, 10 reps, Left  General Comments        Pertinent Vitals/Pain Pain Assessment Pain Assessment: Faces Faces Pain Scale: Hurts a little bit Pain Location: L hip and LE with movement Pain Descriptors / Indicators: Aching, Discomfort, Dull, Operative site guarding Pain Intervention(s): Limited activity within patient's tolerance, Monitored during session    Home Living                          Prior Function            PT Goals (current goals can now be found in the  care plan section) Acute Rehab PT Goals Patient Stated Goal: to be able to get back to work and retire in 3 yrs PT Goal Formulation: With patient Time For Goal Achievement: 06/28/23 Potential to Achieve Goals: Good Progress towards PT goals: Progressing toward goals    Frequency    7X/week      PT Plan      Co-evaluation              AM-PAC PT "6 Clicks" Mobility   Outcome Measure  Help needed turning from your back to your side while in a flat bed without using bedrails?: None Help needed moving from lying on your back to sitting on the side of a flat bed without using bedrails?: A Little Help needed moving to and from a bed to a chair (including a wheelchair)?: A Little Help needed standing up from a chair using your arms (e.g., wheelchair or bedside chair)?: A Little Help needed to walk in hospital room?: A Little Help needed climbing 3-5 steps with a railing? : A Little 6 Click Score: 19    End of Session Equipment Utilized During Treatment: Gait belt Activity Tolerance: Patient tolerated treatment well;No increased pain Patient left: in chair;with call bell/phone within reach Nurse Communication: Mobility status PT Visit Diagnosis: Unsteadiness on feet (R26.81);Muscle weakness (generalized) (M62.81);Other abnormalities of gait and mobility (R26.89);Difficulty in walking, not elsewhere classified (R26.2);Pain Pain - Right/Left: Left Pain - part of body: Hip;Leg     Time: 1202-1240 PT Time Calculation (min) (ACUTE ONLY): 38 min  Charges:    $Gait Training: 23-37 mins $Therapeutic Exercise: 8-22 mins PT General Charges $$ ACUTE PT VISIT: 1 Visit                     Darien Eden, PT Acute Rehabilitation Services Office: [phone] 06/15/2023    Serafin Dames 06/15/2023, 1:01 PM

## 2023-06-15 NOTE — Progress Notes (Signed)
 Subjective: 1 Day Post-Op Procedure(s) (LRB): ARTHROPLASTY, HIP, TOTAL, ANTERIOR APPROACH (Left) Patient reports pain as moderate.    Objective: Vital signs in last 24 hours: Temp:  [97.7 F (36.5 C)-99 F (37.2 C)] 98.4 F (36.9 C) (05/17 0951) Pulse Rate:  [89-116] 110 (05/17 0951) Resp:  [16-18] 18 (05/17 0951) BP: (136-164)/(81-96) 164/96 (05/17 0951) SpO2:  [94 %-100 %] 99 % (05/17 0951)  Intake/Output from previous day: 05/16 0701 - 05/17 0700 In: 2504.9 [P.O.:240; I.V.:1964.9; IV Piggyback:300] Out: 1250 [Urine:1000; Blood:250] Intake/Output this shift: No intake/output data recorded.  Recent Labs    06/15/23 0401  HGB 13.2   Recent Labs    06/15/23 0401  WBC 12.0*  RBC 4.71  HCT 41.0  PLT 234   Recent Labs    06/15/23 0401  NA 134*  K 4.2  CL 100  CO2 26  BUN 12  CREATININE 0.88  GLUCOSE 147*  CALCIUM 8.8*   No results for input(s): "LABPT", "INR" in the last 72 hours.  Sensation intact distally Intact pulses distally Dorsiflexion/Plantar flexion intact Incision: dressing C/D/I   Assessment/Plan: 1 Day Post-Op Procedure(s) (LRB): ARTHROPLASTY, HIP, TOTAL, ANTERIOR APPROACH (Left) Up with therapy Discharge home with home health this afternoon.      Andre [name] 06/15/2023, 11:04 AM

## 2023-06-15 NOTE — Care Management Obs Status (Signed)
 MEDICARE OBSERVATION STATUS NOTIFICATION   Patient Details  Name: Andre [name] MRN: [id number] Date of Birth: February 15, 1955   Medicare Observation Status Notification Given:  Yes    Zenon Hilda, LCSW 06/15/2023, 9:22 AM

## 2023-06-15 NOTE — Discharge Instructions (Signed)

## 2023-06-15 NOTE — Discharge Summary (Signed)
 [name] MRN: [id number] DOB/AGE: 04-04-1955 67 y.o.  Admit date: 06/14/2023 Discharge date: 06/15/2023  Admission Diagnoses:  Principal Problem:   Unilateral primary osteoarthritis, left hip Active Problems:   Status post total replacement of left hip   Discharge Diagnoses:  Same  Past Medical History:  Diagnosis Date   Arthritis    COVID-19 virus infection 01/2019   Essential hypertension, benign    History of colon polyps 2014   History of kidney stones    Nephrolithiasis 06/2014   ER visit   Pure hypercholesterolemia     Surgeries: Procedure(s): ARTHROPLASTY, HIP, TOTAL, ANTERIOR APPROACH on 06/14/2023   Consultants:   Discharged Condition: Improved  Hospital Course: Andre [name] is an 68 y.o. male who was admitted 06/14/2023 for operative treatment ofUnilateral primary osteoarthritis, left hip. Patient has severe unremitting pain that affects sleep, daily activities, and work/hobbies. After pre-op clearance the patient was taken to the operating room on 06/14/2023 and underwent  Procedure(s): ARTHROPLASTY, HIP, TOTAL, ANTERIOR APPROACH.    Patient was given perioperative antibiotics:  Anti-infectives (From admission, onward)    Start     Dose/Rate Route Frequency Ordered Stop   06/14/23 1400  ceFAZolin  (ANCEF ) IVPB 2g/100 mL premix        2 g 200 mL/hr over 30 Minutes Intravenous Every 6 hours 06/14/23 1007 06/14/23 2023   06/14/23 0600  ceFAZolin  (ANCEF ) IVPB 2g/100 mL premix        2 g 200 mL/hr over 30 Minutes Intravenous On call to O.R. 06/14/23 0540 06/14/23 2542        Patient was given sequential compression devices, early ambulation, and chemoprophylaxis to prevent DVT.  Patient benefited maximally from hospital stay and there were no complications.    Recent vital signs: Patient Vitals for the past 24 hrs:  BP Temp Temp src Pulse Resp SpO2  06/15/23 0951 (!) 164/96 98.4 F (36.9 C) Oral (!) 110 18 99 %  06/15/23 0549 (!)  149/81 99 F (37.2 C) Oral (!) 105 18 96 %  06/15/23 0136 (!) 146/84 98.7 F (37.1 C) Oral (!) 110 16 100 %  06/14/23 2048 138/84 98.4 F (36.9 C) -- (!) 108 17 95 %  06/14/23 1905 136/86 98.2 F (36.8 C) Oral (!) 116 16 100 %     Recent laboratory studies:  Recent Labs    06/15/23 0401  WBC 12.0*  HGB 13.2  HCT 41.0  PLT 234  NA 134*  K 4.2  CL 100  CO2 26  BUN 12  CREATININE 0.88  GLUCOSE 147*  CALCIUM 8.8*     Discharge Medications:   Allergies as of 06/15/2023       Reactions   Dust Mite Extract Itching   Itching eyes   Celebrex  [celecoxib ] Other (See Comments)   palpitations        Medication List     TAKE these medications    aspirin  81 MG chewable tablet Chew 1 tablet (81 mg total) by mouth 2 (two) times daily.   benazepril  20 MG tablet Commonly known as: LOTENSIN  Take 1 tablet (20 mg total) by mouth daily.   chlorhexidine  4 % external liquid Commonly known as: HIBICLENS  Apply 15 mLs (1 Application total) topically as directed for 30 doses. Use as directed daily for 5 days every other week for 6 weeks.   methocarbamol  500 MG tablet Commonly known as: ROBAXIN  Take 1 tablet (500 mg total) by mouth every 6 (six) hours as needed for muscle  spasms.   mupirocin  ointment 2 % Commonly known as: BACTROBAN  Place 1 Application into the nose 2 (two) times daily for 60 doses. Use as directed 2 times daily for 5 days every other week for 6 weeks.   oxyCODONE  5 MG immediate release tablet Commonly known as: Oxy IR/ROXICODONE  Take 1-2 tablets (5-10 mg total) by mouth every 6 (six) hours as needed for moderate pain (pain score 4-6) (pain score 4-6). No more than 6 tablets per day.               Durable Medical Equipment  (From admission, onward)           Start     Ordered   06/14/23 1007  DME 3 n 1  Once        06/14/23 1007   06/14/23 1007  DME Walker rolling  Once       Question Answer Comment  Walker: With 5 Inch Wheels   Patient  needs a walker to treat with the following condition Status post total replacement of left hip      06/14/23 1007            Diagnostic Studies: DG Pelvis Portable Result Date: 06/14/2023 CLINICAL DATA:  Status post left hip replacement. EXAM: PORTABLE PELVIS 1-2 VIEWS COMPARISON:  None Available. FINDINGS: Left hip arthroplasty in expected alignment. No periprosthetic lucency or fracture. Recent postsurgical change includes air and edema in the soft tissues. Lateral skin staples in place. Previous right hip arthroplasty. IMPRESSION: Left hip arthroplasty without immediate postoperative complication. Electronically Signed   By: Chadwick Colonel M.D.   On: 06/14/2023 12:05   DG HIP UNILAT WITH PELVIS 2-3 VIEWS LEFT Result Date: 06/14/2023 CLINICAL DATA:  Elective surgery. EXAM: DG HIP (WITH OR WITHOUT PELVIS) 2-3V LEFT COMPARISON:  None Available. FINDINGS: Seven fluoroscopic spot views of the pelvis and left hip obtained in the operating room. Sequential images during hip arthroplasty. Fluoroscopy time 39 seconds. Dose 5.16 mGy. Previous right hip arthroplasty. IMPRESSION: Intraoperative fluoroscopy during left hip arthroplasty. Electronically Signed   By: Chadwick Colonel M.D.   On: 06/14/2023 09:48   DG C-Arm 1-60 Min-No Report Result Date: 06/14/2023 Fluoroscopy was utilized by the requesting physician.  No radiographic interpretation.   DG C-Arm 1-60 Min-No Report Result Date: 06/14/2023 Fluoroscopy was utilized by the requesting physician.  No radiographic interpretation.    Disposition: Discharge disposition: 01-Home or Self Care          Follow-up Information     Arnie Lao, MD Follow up in 2 week(s).   Specialty: Orthopedic Surgery Contact information: [address] [place] [address] [phone]         [facility] Follow up.   Why: Please follow up with this provider for rolling walker needs. Contact  information: [address] [address] [phone]         [facility] Follow up.   Specialty: Home Health Services Why: Please follow up with this provider for home health PT/OT services. Contact information: [address] [place] [address] [phone]                  Signed: Arnie Lao 06/15/2023, 2:57 PM

## 2023-06-18 ENCOUNTER — Encounter (HOSPITAL_COMMUNITY): Payer: Self-pay | Admitting: Orthopaedic Surgery

## 2023-06-27 ENCOUNTER — Ambulatory Visit: Admitting: Orthopaedic Surgery

## 2023-06-27 ENCOUNTER — Encounter: Payer: Self-pay | Admitting: Orthopaedic Surgery

## 2023-06-27 DIAGNOSIS — Z96642 Presence of left artificial hip joint: Secondary | ICD-10-CM

## 2023-06-27 NOTE — Progress Notes (Signed)
 The patient is a 68 year old gentleman who is now 2-week status post a left total hip arthroplasty.  We replaced his right hip last year.  He is doing well overall and is very pleased.  He is ambulating with a walker but plans to transition to a cane soon.  He has been compliant with a baby aspirin  twice daily.  He was not on aspirin  before surgery.  He has already stopped taking narcotic pain medication.  On exam his right hip from last year moves smoothly and fluidly.  The left hip has just a little bit of stiffness postoperative but overall looks great.  Staples have been removed and Steri-Strips applied.  At this point he will continue to increase his activities as comfort allows.  Will see him back in 4 weeks to see how he is doing from a mobility standpoint but no x-rays are needed.

## 2023-07-11 ENCOUNTER — Other Ambulatory Visit: Payer: Self-pay | Admitting: Family Medicine

## 2023-07-11 DIAGNOSIS — I10 Essential (primary) hypertension: Secondary | ICD-10-CM

## 2023-07-12 ENCOUNTER — Other Ambulatory Visit (INDEPENDENT_AMBULATORY_CARE_PROVIDER_SITE_OTHER)

## 2023-07-12 DIAGNOSIS — I10 Essential (primary) hypertension: Secondary | ICD-10-CM | POA: Diagnosis not present

## 2023-07-12 NOTE — Addendum Note (Signed)
 Addended by: Bernadene Brewer on: 07/12/2023 08:19 AM   Modules accepted: Orders

## 2023-07-13 ENCOUNTER — Ambulatory Visit: Payer: Self-pay | Admitting: Family Medicine

## 2023-07-13 LAB — BASIC METABOLIC PANEL WITH GFR
BUN: 11 mg/dL (ref 7–25)
CO2: 28 mmol/L (ref 20–32)
Calcium: 9.5 mg/dL (ref 8.6–10.3)
Chloride: 103 mmol/L (ref 98–110)
Creat: 0.7 mg/dL (ref 0.70–1.35)
Glucose, Bld: 93 mg/dL (ref 65–99)
Potassium: 4.1 mmol/L (ref 3.5–5.3)
Sodium: 140 mmol/L (ref 135–146)
eGFR: 102 mL/min/{1.73_m2} (ref >=60)

## 2023-07-19 ENCOUNTER — Ambulatory Visit: Admitting: Family Medicine

## 2023-07-22 ENCOUNTER — Encounter: Payer: Self-pay | Admitting: Family Medicine

## 2023-07-22 ENCOUNTER — Ambulatory Visit (INDEPENDENT_AMBULATORY_CARE_PROVIDER_SITE_OTHER): Admitting: Family Medicine

## 2023-07-22 VITALS — BP 146/68 | HR 88 | Temp 98.0°F | Ht 72.0 in | Wt 226.4 lb

## 2023-07-22 DIAGNOSIS — I10 Essential (primary) hypertension: Secondary | ICD-10-CM | POA: Diagnosis not present

## 2023-07-22 DIAGNOSIS — Z96642 Presence of left artificial hip joint: Secondary | ICD-10-CM

## 2023-07-22 MED ORDER — TURMERIC-GINGER 130-5 MG PO CHEW: 1.0000 | CHEWABLE_TABLET | Freq: Two times a day (BID) | ORAL | Status: AC

## 2023-07-22 MED ORDER — MENS 50+ MULTIVITAMIN PO TABS: 1.0000 | ORAL_TABLET | Freq: Every day | ORAL | Status: AC

## 2023-07-22 NOTE — Progress Notes (Signed)
 Ph: [phone] Fax: [phone]   [name], male    DOB: 06/07/1955, 68 y.o.   MRN: [id number]  This visit was conducted in person.  BP (!) 146/68 (BP Location: Right Arm, Cuff Size: Large)   Pulse 88   Temp 98 F (36.7 C) (Oral)   Ht 6' (1.829 m)   Wt 226 lb 6 oz (102.7 kg)   SpO2 95%   BMI 30.70 kg/m   BP Readings from Last 3 Encounters:  07/22/23 (!) 146/68  06/15/23 (!) 164/96  06/11/23 (!) 161/100  Initial readings 170s systolic, improved on repeat testing  CC: 3 mo HTN f/u visit  Subjective:   HPI: Andre [name] is a 68 y.o. male presenting on 07/22/2023 for Medical Management of Chronic Issues (Here for 3 mo HTN f/u. Pt accompanied by wife, Andre [name]. )   See prior note for details.   Last visit BP was very high as well - rec restart benazepril  20mg  daily HTN - Compliant with current antihypertensive regimen of ACEI.  Does not check blood pressures at home. No low blood pressure readings or symptoms of dizziness/syncope. Denies HA, vision changes, CP/tightness, SOB, leg swelling.  Did recently eat chick fil-a. Likes the spicy sauce.   S/p L hip replacement Jan) 06/14/2023.      Relevant past medical, surgical, family and social history reviewed and updated as indicated. Interim medical history since our last visit reviewed. Allergies and medications reviewed and updated. Outpatient Medications Prior to Visit  Medication Sig Dispense Refill   benazepril  (LOTENSIN ) 20 MG tablet Take 1 tablet (20 mg total) by mouth daily. 90 tablet 4   aspirin  81 MG chewable tablet Chew 1 tablet (81 mg total) by mouth 2 (two) times daily. 30 tablet 0   chlorhexidine  (HIBICLENS ) 4 % external liquid Apply 15 mLs (1 Application total) topically as directed for 30 doses. Use as directed daily for 5 days every other week for 6 weeks. 946 mL 1   methocarbamol  (ROBAXIN ) 500 MG tablet Take 1 tablet (500 mg total) by mouth every 6 (six) hours as needed for muscle  spasms. 30 tablet 1   oxyCODONE  (OXY IR/ROXICODONE ) 5 MG immediate release tablet Take 1-2 tablets (5-10 mg total) by mouth every 6 (six) hours as needed for moderate pain (pain score 4-6) (pain score 4-6). No more than 6 tablets per day. 30 tablet 0   No facility-administered medications prior to visit.     Per HPI unless specifically indicated in ROS section below Review of Systems  Objective:  BP (!) 146/68 (BP Location: Right Arm, Cuff Size: Large)   Pulse 88   Temp 98 F (36.7 C) (Oral)   Ht 6' (1.829 m)   Wt 226 lb 6 oz (102.7 kg)   SpO2 95%   BMI 30.70 kg/m   Wt Readings from Last 3 Encounters:  07/22/23 226 lb 6 oz (102.7 kg)  06/14/23 225 lb (102.1 kg)  06/11/23 225 lb (102.1 kg)      Physical Exam Vitals and nursing note reviewed.  Constitutional:      Appearance: Normal appearance. He is not ill-appearing.  HENT:     Mouth/Throat:     Mouth: Mucous membranes are moist.     Pharynx: Oropharynx is clear. No oropharyngeal exudate or posterior oropharyngeal erythema.   Eyes:     Extraocular Movements: Extraocular movements intact.     Pupils: Pupils are equal, round, and reactive to light.   Neck:  Thyroid: No thyroid mass or thyromegaly.   Cardiovascular:     Rate and Rhythm: Normal rate and regular rhythm.     Pulses: Normal pulses.     Heart sounds: Normal heart sounds. No murmur heard. Pulmonary:     Effort: Pulmonary effort is normal. No respiratory distress.     Breath sounds: Normal breath sounds. No wheezing, rhonchi or rales.   Musculoskeletal:     Right lower leg: No edema.     Left lower leg: No edema.   Skin:    General: Skin is warm and dry.     Findings: No rash.   Neurological:     Mental Status: He is alert.   Psychiatric:        Mood and Affect: Mood normal.        Behavior: Behavior normal.       Results for orders placed or performed in visit on 07/12/23  Basic metabolic panel with GFR   Collection Time: 07/12/23  8:19  AM  Result Value Ref Range   Glucose, Bld 93 65 - 99 mg/dL   BUN 11 7 - 25 mg/dL   Creat 9.29 9.29 - 8.64 mg/dL   eGFR 897 > OR = 60 fO/fpw/8.26f7   BUN/Creatinine Ratio SEE NOTE: 6 - 22 (calc)   Sodium 140 135 - 146 mmol/L   Potassium 4.1 3.5 - 5.3 mmol/L   Chloride 103 98 - 110 mmol/L   CO2 28 20 - 32 mmol/L   Calcium 9.5 8.6 - 10.3 mg/dL    Assessment & Plan:   Problem List Items Addressed This Visit     Hypertension - Primary   Chronic, improving with recommencement of benazepril .  Reviewed low salt diet, provided with DASH diet handout.  Rec start monitoring at home, BP log sheet provided to record readings.  RTC 4-5 mo f/u HTN.       Status post total replacement of left hip   Recovering well from recent hip replacement surgery Jan)        Meds ordered this encounter  Medications   Multiple Vitamins-Minerals (MENS 50+ MULTIVITAMIN) TABS    Sig: Take 1 tablet by mouth daily. Alive brand   Turmeric-Ginger 130-5 MG CHEW    Sig: Chew 1 tablet by mouth in the morning and at bedtime.    No orders of the defined types were placed in this encounter.   Patient Instructions  BP is improving - continue benazepril  20mg  daily.  Monitor BP at home and keep track, let me know if consistently averaging >140/90.  Your goal blood pressure is <140/90. Work on low salt/sodium diet - goal <2 grams (2,000mg ) per day. Eat a diet high in fruits/vegetables and whole grains.  Look into mediterranean and DASH diet. Goal activity is 122min/wk of moderate intensity exercise.  This can be split into 30 minute chunks.  If you are not at this level, you can start with smaller 10-15 min increments and slowly build up activity. Look at www.heart.org for more resources.   Follow up plan: Return in about 4 months (around 11/21/2023), or if symptoms worsen or fail to improve, for follow up visit.  Anton Blas, MD

## 2023-07-22 NOTE — Assessment & Plan Note (Signed)
 Recovering well from recent hip replacement surgery Jan)

## 2023-07-22 NOTE — Assessment & Plan Note (Signed)
 Chronic, improving with recommencement of benazepril .  Reviewed low salt diet, provided with DASH diet handout.  Rec start monitoring at home, BP log sheet provided to record readings.  RTC 4-5 mo f/u HTN.

## 2023-07-22 NOTE — Patient Instructions (Addendum)
 BP is improving - continue benazepril  20mg  daily.  Monitor BP at home and keep track, let me know if consistently averaging >140/90.  Your goal blood pressure is <140/90. Work on low salt/sodium diet - goal <2 grams (2,000mg ) per day. Eat a diet high in fruits/vegetables and whole grains.  Look into mediterranean and DASH diet. Goal activity is 133min/wk of moderate intensity exercise.  This can be split into 30 minute chunks.  If you are not at this level, you can start with smaller 10-15 min increments and slowly build up activity. Look at www.heart.org for more resources.

## 2023-07-29 ENCOUNTER — Encounter: Payer: Self-pay | Admitting: Orthopaedic Surgery

## 2023-07-29 ENCOUNTER — Ambulatory Visit (INDEPENDENT_AMBULATORY_CARE_PROVIDER_SITE_OTHER): Admitting: Orthopaedic Surgery

## 2023-07-29 DIAGNOSIS — Z96641 Presence of right artificial hip joint: Secondary | ICD-10-CM

## 2023-07-29 DIAGNOSIS — Z96642 Presence of left artificial hip joint: Secondary | ICD-10-CM

## 2023-07-29 NOTE — Progress Notes (Signed)
 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  We have replaced his right hip in the past as well.  He is ambulating using a cane and doing great overall.  He is scheduled to return to work September 1.  He walks with a more normal gait.  His leg lengths appear equal.  His left hip is just a little stiff from surgery and the right hip is moving smoothly.  From my standpoint we will see him back in 6 weeks.  Will have a standing AP pelvis at that visit.  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

## 2023-08-16 ENCOUNTER — Other Ambulatory Visit: Payer: Self-pay

## 2023-08-16 ENCOUNTER — Emergency Department (HOSPITAL_BASED_OUTPATIENT_CLINIC_OR_DEPARTMENT_OTHER)
Admission: EM | Admit: 2023-08-16 | Discharge: 2023-08-16 | Disposition: A | Discharge status: Home / Self Care | Attending: Emergency Medicine | Admitting: Emergency Medicine

## 2023-08-16 ENCOUNTER — Telehealth: Payer: Self-pay

## 2023-08-16 DIAGNOSIS — T783XXA Angioneurotic edema, initial encounter: Secondary | ICD-10-CM | POA: Insufficient documentation

## 2023-08-16 DIAGNOSIS — R220 Localized swelling, mass and lump, head: Secondary | ICD-10-CM | POA: Diagnosis present

## 2023-08-16 MED ORDER — METHYLPREDNISOLONE SODIUM SUCC 125 MG IJ SOLR
125.0000 mg | Freq: Once | INTRAMUSCULAR | Status: AC
  Administered 2023-08-16: 125 mg via INTRAVENOUS
  Filled 2023-08-16: qty 2

## 2023-08-16 MED ORDER — CETIRIZINE HCL 5 MG/5ML PO SOLN
10.0000 mg | Freq: Once | ORAL | Status: AC
  Administered 2023-08-16: 10 mg via ORAL
  Filled 2023-08-16: qty 10

## 2023-08-16 NOTE — Discharge Instructions (Signed)
 Stop the benazepril .  You can take 10 mg of cetirizine a day to help with the swelling.  Return for worsening symptoms.  Call your doctor about a new blood pressure medicine.

## 2023-08-16 NOTE — ED Triage Notes (Signed)
 Patient states swelling to upper lip since last night. Denies pain. Takes lisinopril

## 2023-08-16 NOTE — ED Provider Notes (Signed)
  [facility] Provider Note   CSN: [id number] Arrival date & time: 08/16/23  1034     Patient presents with: Facial Swelling   Andre [name] is a 68 y.o. male.   HPI Patient presents with swelling of his upper lip.  States he bit it last night and now more swelling.  His ACE inhibitor.  No difficulty breathing.  States he woke up this morning and it was much more swollen.  No difficulty swallowing.  No swelling of his tongue.    Prior to Admission medications   Medication Sig Start Date End Date Taking? Authorizing Provider  Multiple Vitamins-Minerals (MENS 50+ MULTIVITAMIN) TABS Take 1 tablet by mouth daily. Alive brand 07/22/23   Rilla Baller, MD  Turmeric-Ginger  130-5 MG CHEW Chew 1 tablet by mouth in the morning and at bedtime. 07/22/23   Rilla Baller, MD    Allergies: Dust mite extract and Celebrex  [celecoxib ]    Review of Systems  Updated Vital Signs BP (!) 156/98 (BP Location: Right Arm)   Pulse 87   Temp (!) 97.1 F (36.2 C)   Resp 18   SpO2 98%   Physical Exam Vitals and nursing note reviewed.  HENT:     Mouth/Throat:     Comments: No posterior pharyngeal swelling or tongue swelling.  However does have very prominent upper lip with apparent angioedema.  No bite marks seen. Cardiovascular:     Rate and Rhythm: Regular rhythm.  Pulmonary:     Breath sounds: No wheezing or rhonchi.  Musculoskeletal:     Cervical back: Neck supple.     Right lower leg: No edema.     Left lower leg: No edema.  Skin:    Capillary Refill: Capillary refill takes less than 2 seconds.  Neurological:     Mental Status: He is alert and oriented to person, place, and time.     (all labs ordered are listed, but only abnormal results are displayed) Labs Reviewed - No data to display  EKG: None  Radiology: No results found.   Procedures   Medications Ordered in the ED  methylPREDNISolone  sodium succinate (SOLU-MEDROL )  125 mg/2 mL injection 125 mg (125 mg Intravenous Given 08/16/23 1150)  cetirizine HCl (Zyrtec) 5 MG/5ML solution 10 mg (10 mg Oral Given 08/16/23 1152)                                    Medical Decision Making Risk Prescription drug management.   Patient with angioedema of upper lip.  Is on an ACE inhibitor.  No previous swelling this.  Although may be did have some trauma.  Will give steroids and some oral Zyrtec  During monitoring in the ER swelling has stayed stable to potentially improved.  Patient states he feels getting better.  Has had steroids.  Has had antihistamine.  Since he has had this since last night think it is unlikely to be an acute dangerous change.  Will follow-up with PCP to replace his benazepril  which will be stopped.  Zyrtec as needed at home.  Return for worsening symptoms.     Final diagnoses:  Angioedema, initial encounter    ED Discharge Orders     None          Patsey Lot, MD 08/16/23 1203

## 2023-08-16 NOTE — ED Notes (Signed)
Pt denies difficulty breathing

## 2023-08-16 NOTE — Telephone Encounter (Signed)
 Copied from CRM [id number]. Topic: General - Call Back - No Documentation >> Aug 16, 2023 11:19 AM Rea BROCKS wrote: Reason for CRM: Patient is currently at [facility] in [place] being treated for angioedema for upper lip, and patient will follow up with Dr. KANDICE if needed. He just wanted to make him aware.  Patients contact is [phone] (M).

## 2023-08-20 NOTE — Telephone Encounter (Addendum)
 Called to speak with patient, left message. Will try again later.  ER note reviewed - angioedema likely to benazepril . Added to allergy list.

## 2023-08-21 NOTE — Telephone Encounter (Signed)
Tried to call again, straight to voicemail.

## 2023-08-30 ENCOUNTER — Encounter: Payer: Self-pay | Admitting: Family Medicine

## 2023-08-30 ENCOUNTER — Ambulatory Visit: Admitting: Family Medicine

## 2023-08-30 VITALS — BP 160/90 | HR 78 | Temp 98.6°F | Ht 72.0 in | Wt 228.2 lb

## 2023-08-30 DIAGNOSIS — I10 Essential (primary) hypertension: Secondary | ICD-10-CM | POA: Diagnosis not present

## 2023-08-30 DIAGNOSIS — T783XXA Angioneurotic edema, initial encounter: Secondary | ICD-10-CM

## 2023-08-30 MED ORDER — AMLODIPINE BESYLATE 5 MG PO TABS: 5.0000 mg | ORAL_TABLET | Freq: Every day | ORAL | 3 refills | Status: AC

## 2023-08-30 NOTE — Telephone Encounter (Signed)
 Seen today in office

## 2023-08-30 NOTE — Patient Instructions (Addendum)
 We will avoid benazepril  or similar blood pressure medicines from now on.  Start amlodipine  5mg  daily. Monitor for ankle swelling, fatigue, flushed feeling.  Start monitoring BP at home - log sheet provided today.  Return in 4-6 weeks for hypertension follow up  Your goal blood pressure is <140/90. Work on low salt/sodium diet - goal <2 grams (2,000mg ) per day. Eat a diet high in fruits/vegetables and whole grains.  Look into mediterranean and DASH diet. Goal activity is 177min/wk of moderate intensity exercise.  This can be split into 30 minute chunks.  If you are not at this level, you can start with smaller 10-15 min increments and slowly build up activity. Look at www.heart.org for more resources

## 2023-08-30 NOTE — Assessment & Plan Note (Signed)
 Benazepril  may have caused angioedema - will avoid ACEI/ARB at this time.  Agrees to retry amlodipine  - start 5mg  daily, monitoring for ankle edema/flushing.  RTC 6 wks HTN f/u visit.  Low sodium diet recommended, HTN instructions provided.

## 2023-08-30 NOTE — Assessment & Plan Note (Signed)
 Angioedema anticipate to acei treated in ER with IV steroid and zyrtec  - now off benazepril   See below.

## 2023-08-30 NOTE — Progress Notes (Signed)
 Ph: [phone] Fax: [phone]   [name], male    DOB: 08/23/1955, 67 y.o.   MRN: [id number]  This visit was conducted in person.  BP (!) 160/90   Pulse 78   Temp 98.6 F (37 C) (Oral)   Ht 6' (1.829 m)   Wt 228 lb 4 oz (103.5 kg)   SpO2 97%   BMI 30.96 kg/m   Elevated on repeat testing   CC: ER f/u  Subjective:   HPI: Andre [name] is a 68 y.o. male presenting on 08/30/2023 for Medical Management of Chronic Issues (F/U appt from [facility]/Pt accompanied by wife by [facility])   Recent ER visit to [facility] with angioedema while on ACEI (benzepril). He also may have bit his upper lip at the time.  No tongue or throat swelling other systemic symptoms including dyspnea.  Treated with methylprednisolone  125mg  IV and zyrtec  10mg .  Benazepril  was discontinued.   Amlodipine  previously may have caused fatigue and diarrhea.  Hydrochlorothiazide also not tolerated.      Relevant past medical, surgical, family and social history reviewed and updated as indicated. Interim medical history since our last visit reviewed. Allergies and medications reviewed and updated. Outpatient Medications Prior to Visit  Medication Sig Dispense Refill   Multiple Vitamins-Minerals (MENS 50+ MULTIVITAMIN) TABS Take 1 tablet by mouth daily. Alive brand     Turmeric-Ginger  130-5 MG CHEW Chew 1 tablet by mouth in the morning and at bedtime.     No facility-administered medications prior to visit.     Per HPI unless specifically indicated in ROS section below Review of Systems  Objective:  BP (!) 160/90   Pulse 78   Temp 98.6 F (37 C) (Oral)   Ht 6' (1.829 m)   Wt 228 lb 4 oz (103.5 kg)   SpO2 97%   BMI 30.96 kg/m   Wt Readings from Last 3 Encounters:  08/30/23 228 lb 4 oz (103.5 kg)  07/22/23 226 lb 6 oz (102.7 kg)  06/14/23 225 lb (102.1 kg)      Physical Exam Vitals and nursing note reviewed.  Constitutional:      Appearance: Normal  appearance. He is not ill-appearing.  HENT:     Mouth/Throat:     Mouth: Mucous membranes are moist.     Pharynx: Oropharynx is clear. No oropharyngeal exudate or posterior oropharyngeal erythema.  Eyes:     Extraocular Movements: Extraocular movements intact.     Conjunctiva/sclera: Conjunctivae normal.     Pupils: Pupils are equal, round, and reactive to light.  Cardiovascular:     Rate and Rhythm: Normal rate and regular rhythm.     Pulses: Normal pulses.     Heart sounds: Normal heart sounds. No murmur heard. Pulmonary:     Effort: Pulmonary effort is normal. No respiratory distress.     Breath sounds: Normal breath sounds. No wheezing, rhonchi or rales.  Musculoskeletal:     Right lower leg: No edema.     Left lower leg: No edema.  Skin:    General: Skin is warm and dry.     Findings: No rash.  Neurological:     Mental Status: He is alert.  Psychiatric:        Mood and Affect: Mood normal.        Behavior: Behavior normal.       Results for orders placed or performed in visit on 07/12/23  Basic metabolic panel with GFR  Collection Time: 07/12/23  8:19 AM  Result Value Ref Range   Glucose, Bld 93 65 - 99 mg/dL   BUN 11 7 - 25 mg/dL   Creat 9.29 9.29 - 8.64 mg/dL   eGFR 897 > OR = 60 fO/fpw/8.26f7   BUN/Creatinine Ratio SEE NOTE: 6 - 22 (calc)   Sodium 140 135 - 146 mmol/L   Potassium 4.1 3.5 - 5.3 mmol/L   Chloride 103 98 - 110 mmol/L   CO2 28 20 - 32 mmol/L   Calcium 9.5 8.6 - 10.3 mg/dL    Assessment & Plan:   Problem List Items Addressed This Visit     Hypertension - Primary   Benazepril  may have caused angioedema - will avoid ACEI/ARB at this time.  Agrees to retry amlodipine  - start 5mg  daily, monitoring for ankle edema/flushing.  RTC 6 wks HTN f/u visit.  Low sodium diet recommended, HTN instructions provided.       Relevant Medications   amLODipine  (NORVASC ) 5 MG tablet   Angioedema   Angioedema anticipate to acei treated in ER with IV steroid  and zyrtec  - now off benazepril   See below.         Meds ordered this encounter  Medications   amLODipine  (NORVASC ) 5 MG tablet    Sig: Take 1 tablet (5 mg total) by mouth daily.    Dispense:  90 tablet    Refill:  3    To replace benazepril     No orders of the defined types were placed in this encounter.   Patient Instructions  We will avoid benazepril  or similar blood pressure medicines from now on.  Start amlodipine  5mg  daily. Monitor for ankle swelling, fatigue, flushed feeling.  Start monitoring BP at home - log sheet provided today.  Return in 4-6 weeks for hypertension follow up  Your goal blood pressure is <140/90. Work on low salt/sodium diet - goal <2 grams (2,000mg ) per day. Eat a diet high in fruits/vegetables and whole grains.  Look into mediterranean and DASH diet. Goal activity is 171min/wk of moderate intensity exercise.  This can be split into 30 minute chunks.  If you are not at this level, you can start with smaller 10-15 min increments and slowly build up activity. Look at www.heart.org for more resources   Follow up plan: Return in about 6 weeks (around 10/11/2023) for follow up visit.  Anton Blas, MD

## 2023-09-09 ENCOUNTER — Other Ambulatory Visit (INDEPENDENT_AMBULATORY_CARE_PROVIDER_SITE_OTHER): Payer: Self-pay

## 2023-09-09 ENCOUNTER — Ambulatory Visit: Admitting: Orthopaedic Surgery

## 2023-09-09 ENCOUNTER — Encounter: Payer: Self-pay | Admitting: Gastroenterology

## 2023-09-09 ENCOUNTER — Encounter: Payer: Self-pay | Admitting: Orthopaedic Surgery

## 2023-09-09 DIAGNOSIS — Z96641 Presence of right artificial hip joint: Secondary | ICD-10-CM

## 2023-09-09 NOTE — Progress Notes (Signed)
 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  We replaced his right hip successfully back in May 2024.  He has been making good progress overall.  He is walking without an assistive device.  There is no significant limp and he has good posture overall.  He has just a little bit of pain.  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  We had given him a note previously to allow him to return to work.  On exam both hips move smoothly and fluidly.  His leg lengths appear near equal and he has a good gait overall.  Standing AP pelvis shows bilateral total hip arthroplasties with no complicating features.  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  We will see him back in 6 months with a final AP pelvis.  If there are issues before then he knows to let us  know.

## 2023-09-18 ENCOUNTER — Telehealth: Payer: Self-pay | Admitting: Orthopaedic Surgery

## 2023-09-18 NOTE — Telephone Encounter (Signed)
 Pt called requesting a return to work letter with no restrictions. Please call pt when ready for pick up. Pt phone number is [phone].

## 2023-09-18 NOTE — Telephone Encounter (Signed)
 Added date please. Pt asked for letter to be return to work Sept. 1, 2025

## 2023-09-18 NOTE — Telephone Encounter (Signed)
 LMOM for patient that letter has been written for him  He can pull it off MyChart or swing by here and we can print it off for him

## 2023-10-11 ENCOUNTER — Ambulatory Visit: Admitting: Family Medicine

## 2023-10-11 ENCOUNTER — Encounter: Payer: Self-pay | Admitting: Family Medicine

## 2023-10-11 VITALS — BP 152/80 | HR 84 | Temp 98.2°F | Ht 72.0 in | Wt 228.4 lb

## 2023-10-11 DIAGNOSIS — Z23 Encounter for immunization: Secondary | ICD-10-CM | POA: Diagnosis not present

## 2023-10-11 DIAGNOSIS — I10 Essential (primary) hypertension: Secondary | ICD-10-CM | POA: Diagnosis not present

## 2023-10-11 MED ORDER — CHLORTHALIDONE 25 MG PO TABS: 25.0000 mg | ORAL_TABLET | Freq: Every day | ORAL | 1 refills | Status: DC

## 2023-10-11 NOTE — Patient Instructions (Addendum)
 Flu shot today  Happy early birthday!  Continue amlodipine  5mg  daily. Add chlorthalidone  25mg  daily new water  pill.  Monitor blood pressures at home, BP log sheet provided today to keep track, return in 7-10 days to repeat potassium levels on new medicine.  Continue good water , fruits/vegetables, low sodium diet.  Look at [facility] diet handout provided today.  Return in 6 months for wellness visit.

## 2023-10-11 NOTE — Assessment & Plan Note (Addendum)
 Chronic, uncontrolled but slowly improving. Continue amlodipine  5mg  daily, add chlorthalidone  25mg  daily, both in the morning. Reviewed potassium rich diet in setting of starting thiazide diuretic. RC 7-10d BMP.   Rec start monitoring BP at home, BP log sheet provided today.  Reassess at 6 mo CPE.

## 2023-10-11 NOTE — Progress Notes (Signed)
 Ph: [phone] Fax: [phone]   [name], male    DOB: [birth year], 67 y.o.   MRN: [id number]  This visit was conducted in person.  BP (!) 152/80 (BP Location: Right Arm, Cuff Size: Normal)   Pulse 84   Temp 98.2 F (36.8 C) (Oral)   Ht 6' (1.829 m)   Wt 228 lb 6 oz (103.6 kg)   SpO2 98%   BMI 30.97 kg/m   BP Readings from Last 3 Encounters:  10/11/23 (!) 152/80  08/30/23 (!) 160/90  08/16/23 (!) 148/95   CC: HTN f/u visit  Subjective:   HPI: Andre [name] is a 68 y.o. male presenting on 10/11/2023 for Medical Management of Chronic Issues (Pt here for f/u HTN)   HTN - Compliant with current antihypertensive regimen of amlodipine  5mg  daily. Does not check blood pressures at home: last check was 1 month ago. No low blood pressure readings or symptoms of dizziness/syncope. Denies HA, vision changes, CP/tightness, SOB, leg swelling.   H/o angioedema while on benazepril .  Intolerance to hydrochlorothiazide - weakness one day a week.  Amlodipine  caused fatigue previously - will stay at 5mg  dose.      Relevant past medical, surgical, family and social history reviewed and updated as indicated. Interim medical history since our last visit reviewed. Allergies and medications reviewed and updated. Outpatient Medications Prior to Visit  Medication Sig Dispense Refill   amLODipine  (NORVASC ) 5 MG tablet Take 1 tablet (5 mg total) by mouth daily. 90 tablet 3   Multiple Vitamins-Minerals (MENS 50+ MULTIVITAMIN) TABS Take 1 tablet by mouth daily. Alive brand     Turmeric-Ginger  130-5 MG CHEW Chew 1 tablet by mouth in the morning and at bedtime.     No facility-administered medications prior to visit.     Per HPI unless specifically indicated in ROS section below Review of Systems  Objective:  BP (!) 152/80 (BP Location: Right Arm, Cuff Size: Normal)   Pulse 84   Temp 98.2 F (36.8 C) (Oral)   Ht 6' (1.829 m)   Wt 228 lb 6 oz (103.6 kg)   SpO2 98%    BMI 30.97 kg/m   Wt Readings from Last 3 Encounters:  10/11/23 228 lb 6 oz (103.6 kg)  08/30/23 228 lb 4 oz (103.5 kg)  07/22/23 226 lb 6 oz (102.7 kg)      Physical Exam Vitals and nursing note reviewed.  Constitutional:      Appearance: Normal appearance. He is not ill-appearing.  HENT:     Mouth/Throat:     Mouth: Mucous membranes are moist.     Pharynx: Oropharynx is clear. No oropharyngeal exudate or posterior oropharyngeal erythema.  Eyes:     Extraocular Movements: Extraocular movements intact.     Conjunctiva/sclera: Conjunctivae normal.     Pupils: Pupils are equal, round, and reactive to light.  Cardiovascular:     Rate and Rhythm: Normal rate and regular rhythm.     Pulses: Normal pulses.     Heart sounds: Normal heart sounds. No murmur heard. Pulmonary:     Effort: Pulmonary effort is normal. No respiratory distress.     Breath sounds: Normal breath sounds. No wheezing, rhonchi or rales.  Musculoskeletal:     Right lower leg: No edema.     Left lower leg: No edema.  Skin:    General: Skin is warm and dry.     Findings: No rash.  Neurological:     Mental Status: He  is alert.  Psychiatric:        Mood and Affect: Mood normal.        Behavior: Behavior normal.       Results for orders placed or performed in visit on 07/12/23  Basic metabolic panel with GFR   Collection Time: 07/12/23  8:19 AM  Result Value Ref Range   Glucose, Bld 93 65 - 99 mg/dL   BUN 11 7 - 25 mg/dL   Creat 9.29 9.29 - 8.64 mg/dL   eGFR 897 > OR = 60 fO/fpw/8.26f7   BUN/Creatinine Ratio SEE NOTE: 6 - 22 (calc)   Sodium 140 135 - 146 mmol/L   Potassium 4.1 3.5 - 5.3 mmol/L   Chloride 103 98 - 110 mmol/L   CO2 28 20 - 32 mmol/L   Calcium 9.5 8.6 - 10.3 mg/dL   Lab Results  Component Value Date   TSH 1.35 04/05/2023    Assessment & Plan:   Problem List Items Addressed This Visit     Hypertension - Primary   Chronic, uncontrolled but slowly improving. Continue amlodipine  5mg   daily, add chlorthalidone  25mg  daily, both in the morning. Reviewed potassium rich diet in setting of starting thiazide diuretic. RC 7-10d BMP.   Rec start monitoring BP at home, BP log sheet provided today.  Reassess at 6 mo CPE.       Relevant Medications   chlorthalidone  (HYGROTON ) 25 MG tablet   Other Relevant Orders   Basic metabolic panel with GFR   Other Visit Diagnoses       Encounter for immunization       Relevant Orders   Flu vaccine HIGH DOSE PF(Fluzone Trivalent) (Completed)        Meds ordered this encounter  Medications   chlorthalidone  (HYGROTON ) 25 MG tablet    Sig: Take 1 tablet (25 mg total) by mouth daily. For blood pressure    Dispense:  90 tablet    Refill:  1    In addition to amlodipine     Orders Placed This Encounter  Procedures   Flu vaccine HIGH DOSE PF(Fluzone Trivalent)   Basic metabolic panel with GFR    Standing Status:   Future    Expiration Date:   10/10/2024    Patient Instructions  Flu shot today  Happy early birthday!  Continue amlodipine  5mg  daily. Add chlorthalidone  25mg  daily new water  pill.  Monitor blood pressures at home, BP log sheet provided today to keep track, return in 7-10 days to repeat potassium levels on new medicine.  Continue good water , fruits/vegetables, low sodium diet.  Look at [facility] diet handout provided today.  Return in 6 months for wellness visit.   Follow up plan: Return in about 6 months (around 04/09/2024), or if symptoms worsen or fail to improve, for medicare wellness visit, annual exam, prior fasting for blood work.  Anton Blas, MD

## 2023-10-18 ENCOUNTER — Observation Stay (HOSPITAL_COMMUNITY)
Admission: EM | Admit: 2023-10-18 | Discharge: 2023-10-20 | Disposition: A | Discharge status: Home / Self Care | Source: Ambulatory Visit | Attending: General Surgery | Admitting: General Surgery

## 2023-10-18 ENCOUNTER — Other Ambulatory Visit: Payer: Self-pay

## 2023-10-18 ENCOUNTER — Ambulatory Visit (INDEPENDENT_AMBULATORY_CARE_PROVIDER_SITE_OTHER): Admitting: Family Medicine

## 2023-10-18 ENCOUNTER — Encounter: Payer: Self-pay | Admitting: Family Medicine

## 2023-10-18 ENCOUNTER — Ambulatory Visit: Payer: Self-pay | Admitting: Family Medicine

## 2023-10-18 ENCOUNTER — Other Ambulatory Visit

## 2023-10-18 ENCOUNTER — Other Ambulatory Visit (INDEPENDENT_AMBULATORY_CARE_PROVIDER_SITE_OTHER)

## 2023-10-18 ENCOUNTER — Encounter (HOSPITAL_COMMUNITY): Payer: Self-pay

## 2023-10-18 ENCOUNTER — Ambulatory Visit
Admission: RE | Admit: 2023-10-18 | Discharge: 2023-10-18 | Disposition: A | Discharge status: Home / Self Care | Source: Ambulatory Visit | Attending: Family Medicine | Admitting: Family Medicine

## 2023-10-18 VITALS — BP 138/72 | HR 94 | Temp 99.3°F | Ht 72.0 in | Wt 228.1 lb

## 2023-10-18 DIAGNOSIS — Z96643 Presence of artificial hip joint, bilateral: Secondary | ICD-10-CM | POA: Insufficient documentation

## 2023-10-18 DIAGNOSIS — R1031 Right lower quadrant pain: Secondary | ICD-10-CM

## 2023-10-18 DIAGNOSIS — M1990 Unspecified osteoarthritis, unspecified site: Secondary | ICD-10-CM | POA: Diagnosis not present

## 2023-10-18 DIAGNOSIS — I10 Essential (primary) hypertension: Secondary | ICD-10-CM | POA: Diagnosis not present

## 2023-10-18 DIAGNOSIS — Z79899 Other long term (current) drug therapy: Secondary | ICD-10-CM | POA: Insufficient documentation

## 2023-10-18 DIAGNOSIS — K760 Fatty (change of) liver, not elsewhere classified: Secondary | ICD-10-CM | POA: Diagnosis not present

## 2023-10-18 DIAGNOSIS — K3580 Unspecified acute appendicitis: Secondary | ICD-10-CM | POA: Diagnosis not present

## 2023-10-18 DIAGNOSIS — K8020 Calculus of gallbladder without cholecystitis without obstruction: Secondary | ICD-10-CM | POA: Diagnosis not present

## 2023-10-18 DIAGNOSIS — K8080 Other cholelithiasis without obstruction: Secondary | ICD-10-CM

## 2023-10-18 DIAGNOSIS — Z8616 Personal history of COVID-19: Secondary | ICD-10-CM | POA: Diagnosis not present

## 2023-10-18 LAB — POC URINALSYSI DIPSTICK (AUTOMATED)
Bilirubin, UA: NEGATIVE
Glucose, UA: NEGATIVE
Ketones, UA: NEGATIVE
Leukocytes, UA: NEGATIVE
Nitrite, UA: NEGATIVE
Protein, UA: NEGATIVE
Spec Grav, UA: 1.02 (ref 1.010–1.025)
Urobilinogen, UA: 0.2 U/dL
pH, UA: 6 (ref 5.0–8.0)

## 2023-10-18 LAB — CBC WITH DIFFERENTIAL/PLATELET
Abs Immature Granulocytes: 0.03 K/uL (ref 0.00–0.07)
Basophils Absolute: 0 K/uL (ref 0.0–0.1)
Basophils Absolute: 0 K/uL (ref 0.0–0.1)
Basophils Relative: 0.3 % (ref 0.0–3.0)
Basophils Relative: 0 %
Eosinophils Absolute: 0.1 K/uL (ref 0.0–0.7)
Eosinophils Absolute: 0.1 K/uL (ref 0.0–0.5)
Eosinophils Relative: 0.5 % (ref 0.0–5.0)
Eosinophils Relative: 1 %
HCT: 44.2 % (ref 39.0–52.0)
HCT: 44.9 % (ref 39.0–52.0)
Hemoglobin: 14.7 g/dL (ref 13.0–17.0)
Hemoglobin: 14.5 g/dL (ref 13.0–17.0)
Immature Granulocytes: 0 %
Lymphocytes Relative: 15.8 % (ref 12.0–46.0)
Lymphocytes Relative: 13 %
Lymphs Abs: 1.7 K/uL (ref 0.7–4.0)
Lymphs Abs: 1.4 K/uL (ref 0.7–4.0)
MCH: 26.6 pg (ref 26.0–34.0)
MCHC: 33.4 g/dL (ref 30.0–36.0)
MCHC: 32.3 g/dL (ref 30.0–36.0)
MCV: 82 fl (ref 78.0–100.0)
MCV: 82.2 fL (ref 80.0–100.0)
Monocytes Absolute: 1 K/uL (ref 0.1–1.0)
Monocytes Absolute: 0.8 K/uL (ref 0.1–1.0)
Monocytes Relative: 9.8 % (ref 3.0–12.0)
Monocytes Relative: 8 %
Neutro Abs: 7.7 K/uL (ref 1.4–7.7)
Neutro Abs: 8.5 K/uL — ABNORMAL HIGH (ref 1.7–7.7)
Neutrophils Relative %: 73.6 % (ref 43.0–77.0)
Neutrophils Relative %: 78 %
Platelets: 234 K/uL (ref 150.0–400.0)
Platelets: 245 K/uL (ref 150–400)
RBC: 5.39 Mil/uL (ref 4.22–5.81)
RBC: 5.46 MIL/uL (ref 4.22–5.81)
RDW: 14.6 % (ref 11.5–15.5)
RDW: 14.3 % (ref 11.5–15.5)
WBC: 10.5 K/uL (ref 4.0–10.5)
WBC: 10.8 K/uL — ABNORMAL HIGH (ref 4.0–10.5)
nRBC: 0 % (ref 0.0–0.2)

## 2023-10-18 LAB — BASIC METABOLIC PANEL WITH GFR
BUN: 9 mg/dL (ref 6–23)
CO2: 30 meq/L (ref 19–32)
Calcium: 9.2 mg/dL (ref 8.4–10.5)
Chloride: 103 meq/L (ref 96–112)
Creatinine, Ser: 0.77 mg/dL (ref 0.40–1.50)
GFR: 93.62 mL/min (ref >60.00)
Glucose, Bld: 114 mg/dL — ABNORMAL HIGH (ref 70–99)
Potassium: 3.9 meq/L (ref 3.5–5.1)
Sodium: 139 meq/L (ref 135–145)

## 2023-10-18 LAB — LIPASE, BLOOD: Lipase: 22 U/L (ref 11–51)

## 2023-10-18 LAB — COMPREHENSIVE METABOLIC PANEL WITH GFR
ALT: 21 U/L (ref 0–44)
AST: 20 U/L (ref 15–41)
Albumin: 4.2 g/dL (ref 3.5–5.0)
Alkaline Phosphatase: 88 U/L (ref 38–126)
Anion gap: 14 (ref 5–15)
BUN: 10 mg/dL (ref 8–23)
CO2: 23 mmol/L (ref 22–32)
Calcium: 9.2 mg/dL (ref 8.9–10.3)
Chloride: 100 mmol/L (ref 98–111)
Creatinine, Ser: 0.69 mg/dL (ref 0.61–1.24)
GFR, Estimated: >60 mL/min (ref >60)
Glucose, Bld: 165 mg/dL — ABNORMAL HIGH (ref 70–99)
Potassium: 3.8 mmol/L (ref 3.5–5.1)
Sodium: 137 mmol/L (ref 135–145)
Total Bilirubin: 0.8 mg/dL (ref 0.0–1.2)
Total Protein: 7.2 g/dL (ref 6.5–8.1)

## 2023-10-18 LAB — PROTIME-INR
INR: 1.1 (ref 0.8–1.2)
Prothrombin Time: 14.6 s (ref 11.4–15.2)

## 2023-10-18 LAB — HEPATIC FUNCTION PANEL
ALT: 17 U/L (ref 0–53)
AST: 16 U/L (ref 0–37)
Albumin: 4.2 g/dL (ref 3.5–5.2)
Alkaline Phosphatase: 70 U/L (ref 39–117)
Bilirubin, Direct: 0.2 mg/dL (ref 0.0–0.3)
Total Bilirubin: 1 mg/dL (ref 0.2–1.2)
Total Protein: 7.3 g/dL (ref 6.0–8.3)

## 2023-10-18 LAB — TYPE AND SCREEN
ABO/RH(D): O POS
Antibody Screen: NEGATIVE

## 2023-10-18 LAB — I-STAT CG4 LACTIC ACID, ED: Lactic Acid, Venous: 1.4 mmol/L (ref 0.5–1.9)

## 2023-10-18 LAB — LIPASE: Lipase: 15 U/L (ref 11.0–59.0)

## 2023-10-18 MED ORDER — ONDANSETRON HCL 4 MG/2ML IJ SOLN: 4.0000 mg | Freq: Four times a day (QID) | INTRAMUSCULAR | Status: DC | PRN

## 2023-10-18 MED ORDER — DOCUSATE SODIUM 100 MG PO CAPS
100.0000 mg | ORAL_CAPSULE | Freq: Two times a day (BID) | ORAL | Status: DC
  Administered 2023-10-18 – 2023-10-20 (×3): 100 mg via ORAL
  Filled 2023-10-18 (×3): qty 1

## 2023-10-18 MED ORDER — METHOCARBAMOL 500 MG PO TABS: 500.0000 mg | ORAL_TABLET | Freq: Three times a day (TID) | ORAL | Status: DC | PRN

## 2023-10-18 MED ORDER — MELATONIN 3 MG PO TABS: 3.0000 mg | ORAL_TABLET | Freq: Every evening | ORAL | Status: DC | PRN

## 2023-10-18 MED ORDER — METHOCARBAMOL 1000 MG/10ML IJ SOLN: 500.0000 mg | Freq: Three times a day (TID) | INTRAMUSCULAR | Status: DC | PRN

## 2023-10-18 MED ORDER — SODIUM CHLORIDE 0.9 % IV SOLN
2.0000 g | INTRAVENOUS | Status: DC
  Administered 2023-10-19 – 2023-10-20 (×2): 2 g via INTRAVENOUS
  Filled 2023-10-18 (×2): qty 20

## 2023-10-18 MED ORDER — OXYCODONE HCL 5 MG PO TABS: 5.0000 mg | ORAL_TABLET | ORAL | Status: DC | PRN

## 2023-10-18 MED ORDER — ACETAMINOPHEN 500 MG PO TABS
1000.0000 mg | ORAL_TABLET | Freq: Four times a day (QID) | ORAL | Status: DC
  Administered 2023-10-18 – 2023-10-20 (×5): 1000 mg via ORAL
  Filled 2023-10-18 (×5): qty 2

## 2023-10-18 MED ORDER — SODIUM CHLORIDE 0.9 % IV BOLUS
1000.0000 mL | Freq: Once | INTRAVENOUS | Status: AC
  Administered 2023-10-18: 1000 mL via INTRAVENOUS

## 2023-10-18 MED ORDER — KCL IN DEXTROSE-NACL 20-5-0.45 MEQ/L-%-% IV SOLN
INTRAVENOUS | Status: AC
  Filled 2023-10-18 (×3): qty 1000

## 2023-10-18 MED ORDER — SENNA 8.6 MG PO TABS
1.0000 | ORAL_TABLET | Freq: Two times a day (BID) | ORAL | Status: DC
  Administered 2023-10-18 – 2023-10-20 (×3): 8.6 mg via ORAL
  Filled 2023-10-18 (×3): qty 1

## 2023-10-18 MED ORDER — ENOXAPARIN SODIUM 40 MG/0.4ML IJ SOSY
40.0000 mg | PREFILLED_SYRINGE | INTRAMUSCULAR | Status: DC
  Administered 2023-10-18 – 2023-10-19 (×2): 40 mg via SUBCUTANEOUS
  Filled 2023-10-18 (×2): qty 0.4

## 2023-10-18 MED ORDER — METOPROLOL TARTRATE 5 MG/5ML IV SOLN: 5.0000 mg | Freq: Four times a day (QID) | INTRAVENOUS | Status: DC | PRN

## 2023-10-18 MED ORDER — DIPHENHYDRAMINE HCL 50 MG/ML IJ SOLN: 12.5000 mg | Freq: Four times a day (QID) | INTRAMUSCULAR | Status: DC | PRN

## 2023-10-18 MED ORDER — PROCHLORPERAZINE EDISYLATE 10 MG/2ML IJ SOLN: 5.0000 mg | Freq: Four times a day (QID) | INTRAMUSCULAR | Status: DC | PRN

## 2023-10-18 MED ORDER — IOPAMIDOL (ISOVUE-300) INJECTION 61%
100.0000 mL | Freq: Once | INTRAVENOUS | Status: AC | PRN
  Administered 2023-10-18: 100 mL via INTRAVENOUS

## 2023-10-18 MED ORDER — SIMETHICONE 80 MG PO CHEW: 40.0000 mg | CHEWABLE_TABLET | Freq: Four times a day (QID) | ORAL | Status: DC | PRN

## 2023-10-18 MED ORDER — PROCHLORPERAZINE MALEATE 10 MG PO TABS: 10.0000 mg | ORAL_TABLET | Freq: Four times a day (QID) | ORAL | Status: DC | PRN

## 2023-10-18 MED ORDER — ONDANSETRON 4 MG PO TBDP: 4.0000 mg | ORAL_TABLET | Freq: Four times a day (QID) | ORAL | Status: DC | PRN

## 2023-10-18 MED ORDER — DIPHENHYDRAMINE HCL 12.5 MG/5ML PO ELIX: 12.5000 mg | ORAL_SOLUTION | Freq: Four times a day (QID) | ORAL | Status: DC | PRN

## 2023-10-18 MED ORDER — AMLODIPINE BESYLATE 5 MG PO TABS
5.0000 mg | ORAL_TABLET | Freq: Every day | ORAL | Status: DC
  Administered 2023-10-20: 5 mg via ORAL
  Filled 2023-10-18: qty 1

## 2023-10-18 MED ORDER — PIPERACILLIN-TAZOBACTAM 3.375 G IVPB 30 MIN
3.3750 g | Freq: Once | INTRAVENOUS | Status: AC
  Administered 2023-10-18: 3.375 g via INTRAVENOUS
  Filled 2023-10-18: qty 50

## 2023-10-18 MED ORDER — TRAMADOL HCL 50 MG PO TABS: 50.0000 mg | ORAL_TABLET | Freq: Four times a day (QID) | ORAL | Status: DC | PRN

## 2023-10-18 MED ORDER — POLYETHYLENE GLYCOL 3350 17 G PO PACK
17.0000 g | PACK | Freq: Every day | ORAL | Status: DC | PRN
  Administered 2023-10-19: 17 g via ORAL
  Filled 2023-10-18: qty 1

## 2023-10-18 MED ORDER — MORPHINE SULFATE (PF) 2 MG/ML IV SOLN
1.0000 mg | INTRAVENOUS | Status: DC | PRN
  Administered 2023-10-19: 2 mg via INTRAVENOUS
  Filled 2023-10-18: qty 1

## 2023-10-18 MED ORDER — METRONIDAZOLE 500 MG/100ML IV SOLN
500.0000 mg | Freq: Two times a day (BID) | INTRAVENOUS | Status: DC
  Administered 2023-10-19 – 2023-10-20 (×4): 500 mg via INTRAVENOUS
  Filled 2023-10-18 (×4): qty 100

## 2023-10-18 NOTE — H&P (Signed)
 Andre [name] is an 68 y.o. male.   Chief Complaint: abdominal pain HPI:  Patient presents with 2 days of abdominal pain that has been worsening.  The pain woke him up from sleep initially.  He saw his primary care physician this morning and labs and a CT scan were performed today.  He did have a low-grade fever yesterday as well as mild nausea.  Today he had no fever.  He did not have any leukocytosis today on morning labs, but evening labs did show an elevated white count.  CT showed concern for appendicitis.  There was some thickening of the cecum also which raise concern of possible malignancy versus secondary inflammation.  Prior to getting a call about the CT scan, he had a cheeseburger at 4 PM today.  He denies any diarrhea.  He does report some mild constipation.  Of note, he has a sister who has had colorectal cancer.  He has had a colonoscopy in 2019 and at that point had a one 4 mm polyp.  This was hyperplastic on pathology.  In 2014 he had a single 3 mm adenomatous polyp.  Past Medical History:  Diagnosis Date   Arthritis    COVID-19 virus infection 01/2019   Essential hypertension, benign    History of colon polyps 2014   History of kidney stones    Nephrolithiasis 06/2014   ER visit   Pure hypercholesterolemia     Past Surgical History:  Procedure Laterality Date   COLONOSCOPY  01/2012   tubular adenoma [name], rpt 5 yrs   COLONOSCOPY  03/2017   hyperplastic polyp, int hem, rpt 5 yrs [name]   TONSILLECTOMY     as a child   TOTAL HIP ARTHROPLASTY Right 06/01/2022   Procedure: RIGHT TOTAL HIP ARTHROPLASTY ANTERIOR APPROACH;  Surgeon: Vernetta Lonni GRADE, MD;  Location: [facility];  Service: Orthopedics;  Laterality: Right;   TOTAL HIP ARTHROPLASTY Left 06/14/2023   Procedure: ARTHROPLASTY, HIP, TOTAL, ANTERIOR APPROACH;  Surgeon: Vernetta Lonni GRADE, MD;  Location: [facility];  Service: Orthopedics;  Laterality: Left;    Family History  Problem Relation Age of Onset    Hypertension Mother    Heart disease Mother        enlarged heart   Colon cancer Sister 50   Breast cancer Sister 23   Diabetes Sister    Kidney disease Sister    HIV Brother    Stomach cancer Neg Hx    CAD Neg Hx    Stroke Neg Hx    Social History:  reports that he has never smoked. He has never used smokeless tobacco. He reports that he does not currently use alcohol after a past usage of about 3.0 standard drinks of alcohol per week. He reports that he does not use drugs.  Allergies:  Allergies  Allergen Reactions   Dust Mite Extract Itching    Itching eyes   Benazepril  Swelling    Angioedema - lips   Celebrex  [Celecoxib ] Other (See Comments)    palpitations   Medications:   amLODipine  (NORVASC ) 5 MG tablet chlorthalidone  (HYGROTON ) 25 MG tablet Multiple Vitamins-Minerals (MENS 50+ MULTIVITAMIN) TABS Turmeric-Ginger  130-5 MG CHEW   Results for orders placed or performed during the hospital encounter of 10/18/23 (from the past 48 hours)  I-Stat CG4 Lactic Acid     Status: None   Collection Time: 10/18/23  5:07 PM  Result Value Ref Range   Lactic Acid, Venous 1.4 0.5 - 1.9 mmol/L   CT  ABDOMEN PELVIS W CONTRAST Result Date: 10/18/2023 CLINICAL DATA:  RIGHT lower quadrant abdominal pain for 2 days. Constipation. EXAM: CT ABDOMEN AND PELVIS WITH CONTRAST TECHNIQUE: Multidetector CT imaging of the abdomen and pelvis was performed using the standard protocol following bolus administration of intravenous contrast. RADIATION DOSE REDUCTION: This exam was performed according to the departmental dose-optimization program which includes automated exposure control, adjustment of the mA and/or kV according to patient size and/or use of iterative reconstruction technique. CONTRAST:  100mL ISOVUE -300 IOPAMIDOL  (ISOVUE -300) INJECTION 61% COMPARISON:  08/28/2018 FINDINGS: Lower chest: No acute abnormality. Hepatobiliary: Diffuse hepatic steatosis. No focal hepatic lesion. Minimal  cholelithiasis. No bile duct dilatation. Pancreas: Unremarkable. No pancreatic ductal dilatation or surrounding inflammatory changes. Spleen: Normal in size without focal abnormality. Adrenals/Urinary Tract: Adrenal glands are normal. 2.7 cm LEFT and 2.3 cm RIGHT simple renal cysts do not require dedicated imaging surveillance. 1.4 cm nonobstructing calculus seen in the mid LEFT kidney. No hydronephrosis or hydroureter. Evaluation of the bladder limited due to underdistention and streak artifact from BILATERAL total hip prostheses. Stomach/Bowel: Small duodenal diverticulum is seen. No small bowel dilatation to indicate ileus or obstruction. The proximal appendix is thickened measuring up to 11 mm with adjacent fat stranding, suspicious for acute appendicitis. There is also thickening of the wall of the cecum, best seen on image 71 of series 6. This may be reactive to acute appendicitis, however could also represent cecal malignancy. Vascular/Lymphatic: Aortic atherosclerosis. No enlarged abdominal or pelvic lymph nodes. Reproductive: Unable to evaluate the prostate due to streak artifact from BILATERAL total hip prosthesis. Other: Small fat containing umbilical hernia. Musculoskeletal: No acute or significant osseous findings. IMPRESSION: 1. Thickened proximal appendix with adjacent fat stranding, suspicious for acute appendicitis. 2. Thickening of the wall of the cecum may be reactive to acute appendicitis, however could also represent cecal malignancy. Correlation with colonoscopy should be performed when patient condition allows. 3. Diffuse hepatic steatosis. 4. Cholelithiasis. 5. 1.4 cm nonobstructing LEFT renal calculus. These results were called by telephone at the time of interpretation on 10/18/2023 at 3:30 pm to provider ANTON BLAS , who verbally acknowledged these results. Electronically Signed   By: Aliene Lloyd M.D.   On: 10/18/2023 15:31    Review of Systems  Constitutional:  Positive for  fever (yesterday).  HENT: Negative.    Eyes: Negative.   Respiratory: Negative.    Cardiovascular: Negative.   Gastrointestinal:  Positive for abdominal pain and constipation. Negative for nausea and vomiting.  Endocrine: Negative.   Genitourinary: Negative.   Musculoskeletal: Negative.   Skin: Negative.   Allergic/Immunologic: Negative.   Neurological: Negative.   Hematological: Negative.   Psychiatric/Behavioral: Negative.    All other systems reviewed and are negative.   Blood pressure (!) 157/85, pulse (!) 108, temperature 98.4 F (36.9 C), resp. rate 18, SpO2 98%. Physical Exam Vitals reviewed.  Constitutional:      General: He is not in acute distress.    Appearance: He is well-developed. He is not ill-appearing, toxic-appearing or diaphoretic.  HENT:     Head: Normocephalic and atraumatic.     Mouth/Throat:     Mouth: Mucous membranes are moist.     Pharynx: Oropharynx is clear.  Eyes:     General: No scleral icterus.    Extraocular Movements: Extraocular movements intact.     Pupils: Pupils are equal, round, and reactive to light.  Cardiovascular:     Rate and Rhythm: Normal rate and regular rhythm.     Heart  sounds: Normal heart sounds. No murmur heard.    No gallop.  Pulmonary:     Effort: Pulmonary effort is normal. No respiratory distress.     Breath sounds: Normal breath sounds. No wheezing or rhonchi.  Chest:     Chest wall: No tenderness.  Abdominal:     General: Abdomen is protuberant. Bowel sounds are decreased.     Palpations: Abdomen is soft. There is no shifting dullness, fluid wave, hepatomegaly, splenomegaly or mass.     Tenderness: There is abdominal tenderness in the right lower quadrant and suprapubic area. There is no guarding or rebound. Negative signs include Rovsing's sign and psoas sign.     Hernia: No hernia is present.  Skin:    General: Skin is warm and dry.     Capillary Refill: Capillary refill takes 2 to 3 seconds.     Coloration:  Skin is not cyanotic, jaundiced, mottled or pale.     Findings: No erythema or rash.  Neurological:     General: No focal deficit present.     Mental Status: He is alert and oriented to person, place, and time.     Cranial Nerves: No cranial nerve deficit.     Motor: No weakness.  Psychiatric:        Mood and Affect: Mood normal. Mood is not anxious or depressed.        Behavior: Behavior normal.      Assessment/Plan Acute appendicitis HTN OA  NPO after midnight Iv antibiotics IV fluids Lap appy Discussed thickened cecum and possibility of malignancy and open operation OR planned for tomorrow AM  Appendectomy was described to the patient.  The incisions and surgical technique were explained.  The patient was advised that some of the hair on the abdomen would be clipped, and that a foley catheter would be placed.  I advised the patient of the risks of surgery including, but not limited to, bleeding, infection, damage to other structures, risk of an open operation, risk of abscess, and risk of blood clot.  The recovery was also described to the patient.  He was advised that he will have lifting restrictions for 2 weeks.     Jina Nephew, MD 10/18/2023, 5:32 PM

## 2023-10-18 NOTE — Progress Notes (Signed)
 Ph: [phone] Fax: [phone]   [name], male    DOB: August 22, 1955, 69 y.o.   MRN: [id number]  This visit was conducted in person.  BP 138/72   Pulse 94   Temp 99.3 F (37.4 C) (Oral)   Ht 6' (1.829 m)   Wt 228 lb 2 oz (103.5 kg)   SpO2 96%   BMI 30.94 kg/m   BP Readings from Last 3 Encounters:  10/18/23 138/72  10/11/23 (!) 152/80  08/30/23 (!) 160/90   CC: abdominal discomfort  Subjective:   HPI: Andre [name] is a 68 y.o. male presenting on 10/18/2023 for Abdominal Pain and Medical Management of Chronic Issues (Pt accompanied by his wife Jolet/Lower abdominal is sore. Started 2 nights ago. Pain level today is 3/Pt states he took laxative on 10/17/23. Denies diarrhea or any N/V.)   2d h/o lower abdominal discomfort described as severe cramping like gas and chills - this woke him up from sleep. Felt constipated, had small BM. Treated at home with tea, soup, bland diet. Continues having lower abd soreness predominantly RLQ. Yesterday took a plant based laxative. At its worst 8-9/10, currently 3/10 pain, 6/10 with palpation RLQ. Stayed in bed all day Wednesday. Bowels overall have continued normally - 1 soft stool/day. Stool also looks normal.   No nausea/vomiting, upper abd pain, GERD symptoms, diarrhea, blood in stool.  No dysuria, urgency, frequency, hematuria. Remote large L kidney stone last evaluated 2020 - this feels different.   He had hamburger at [employer] the night before.   Seen here 10/11/2023 for hypertension despite amlodipine  5mg  daily - we added chlorthalidone  25mg  daily. However he states he has not started this yet.   COLONOSCOPY 03/2017 - hyperplastic polyp, int hem, rpt 5 yrs [name] No h/o diverticulosis     Relevant past medical, surgical, family and social history reviewed and updated as indicated. Interim medical history since our last visit reviewed. Allergies and medications reviewed and updated. Outpatient  Medications Prior to Visit  Medication Sig Dispense Refill   amLODipine  (NORVASC ) 5 MG tablet Take 1 tablet (5 mg total) by mouth daily. 90 tablet 3   Multiple Vitamins-Minerals (MENS 50+ MULTIVITAMIN) TABS Take 1 tablet by mouth daily. Alive brand     Turmeric-Ginger  130-5 MG CHEW Chew 1 tablet by mouth in the morning and at bedtime.     chlorthalidone  (HYGROTON ) 25 MG tablet Take 1 tablet (25 mg total) by mouth daily. For blood pressure (Patient not taking: Reported on 10/18/2023) 90 tablet 1   No facility-administered medications prior to visit.     Per HPI unless specifically indicated in ROS section below Review of Systems  Objective:  BP 138/72   Pulse 94   Temp 99.3 F (37.4 C) (Oral)   Ht 6' (1.829 m)   Wt 228 lb 2 oz (103.5 kg)   SpO2 96%   BMI 30.94 kg/m   Wt Readings from Last 3 Encounters:  10/18/23 228 lb 2 oz (103.5 kg)  10/11/23 228 lb 6 oz (103.6 kg)  08/30/23 228 lb 4 oz (103.5 kg)      Physical Exam Vitals and nursing note reviewed.  Constitutional:      Appearance: Normal appearance. He is not ill-appearing.  HENT:     Mouth/Throat:     Mouth: Mucous membranes are moist.     Pharynx: Oropharynx is clear. No oropharyngeal exudate or posterior oropharyngeal erythema.  Eyes:     Extraocular Movements: Extraocular movements intact.  Pupils: Pupils are equal, round, and reactive to light.  Cardiovascular:     Rate and Rhythm: Normal rate and regular rhythm.     Pulses: Normal pulses.     Heart sounds: Normal heart sounds. No murmur heard. Pulmonary:     Effort: Pulmonary effort is normal. No respiratory distress.     Breath sounds: Normal breath sounds. No wheezing, rhonchi or rales.  Abdominal:     General: Bowel sounds are normal. There is no distension.     Palpations: Abdomen is soft. There is no mass.     Tenderness: There is abdominal tenderness (moderate) in the right lower quadrant. There is guarding. There is no right CVA tenderness, left CVA  tenderness or rebound. Negative signs include Murphy's sign.     Hernia: No hernia is present.     Comments: Generalized abdominal discomfort to palpation with more severe pain localized to RLQ  Musculoskeletal:     Right lower leg: No edema.     Left lower leg: No edema.  Skin:    General: Skin is warm and dry.     Findings: No rash.  Neurological:     Mental Status: He is alert.  Psychiatric:        Mood and Affect: Mood normal.        Behavior: Behavior normal.       Results for orders placed or performed in visit on 10/18/23  POCT Urinalysis Dipstick (Automated)   Collection Time: 10/18/23  9:12 AM  Result Value Ref Range   Color, UA Yellow    Clarity, UA clear    Glucose, UA Negative Negative   Bilirubin, UA negative    Ketones, UA negative    Spec Grav, UA 1.020 1.010 - 1.025   Blood, UA     pH, UA 6.0 5.0 - 8.0   Protein, UA Negative Negative   Urobilinogen, UA 0.2 0.2 or 1.0 E.U./dL   Nitrite, UA negative    Leukocytes, UA Negative Negative    Lab Results  Component Value Date   NA 140 07/12/2023   CL 103 07/12/2023   K 4.1 07/12/2023   CO2 28 07/12/2023   BUN 11 07/12/2023   CREATININE 0.70 07/12/2023   EGFR 102 07/12/2023   CALCIUM 9.5 07/12/2023   ALBUMIN 4.5 04/05/2023   GLUCOSE 93 07/12/2023    Lab Results  Component Value Date   ALT 21 04/05/2023   AST 17 04/05/2023   ALKPHOS 80 04/05/2023   BILITOT 0.4 04/05/2023    Lab Results  Component Value Date   WBC 12.0 (H) 06/15/2023   HGB 13.2 06/15/2023   HCT 41.0 06/15/2023   MCV 87.0 06/15/2023   PLT 234 06/15/2023   Assessment & Plan:   Problem List Items Addressed This Visit     Right lower quadrant abdominal pain - Primary   Acute lower abd pain with focal pain localizing to RLQ suspicious for diverticulitis r/o appendicitis.  However colonoscopy 2019 didn't mention diverticulosis.  Check labwork today including urinalysis and will order stat CT scan for further evaluation. He has  upcoming GI eval next month to discuss repeat colonoscopy.       Relevant Orders   Hepatic function panel   Lipase   CBC with Differential/Platelet   CT ABDOMEN PELVIS W CONTRAST   POCT Urinalysis Dipstick (Automated) (Completed)     No orders of the defined types were placed in this encounter.   Orders Placed This Encounter  Procedures  CT ABDOMEN PELVIS W CONTRAST    Standing Status:   Future    Expiration Date:   10/17/2024    Scheduling Instructions:     Contact pt at [phone] to schedule    If indicated for the ordered procedure, I authorize the administration of contrast media per Radiology protocol:   Yes    Does the patient have a contrast media/X-ray dye allergy?:   No    Preferred imaging location?:   [facility]    If indicated for the ordered procedure, I authorize the administration of oral contrast media per Radiology protocol:   Yes   Hepatic function panel   Lipase   CBC with Differential/Platelet   POCT Urinalysis Dipstick (Automated)    Patient Instructions  Labs today  Will order CT scan to be done today - will contact you to schedule [phone]. We will be in touch with results.    Follow up plan: Return if symptoms worsen or fail to improve.  Anton Blas, MD

## 2023-10-18 NOTE — ED Provider Notes (Signed)
 [place] EMERGENCY DEPARTMENT AT [facility] Provider Note  CSN: [id number] Arrival date & time: 10/18/23 1637  Chief Complaint(s) Abdominal Pain  HPI Andre [name] is a 68 y.o. male with past medical history as below, significant for hypertension, nephrolithiasis, HLD, obesity who presents to the ED with complaint of acute appendicitis  He has been having right lower quad abdominal pain over the past 2 days, he had outpatient CT scan performed today which was revealing for acute appendicitis and was advised to come to the ER for evaluation.  Last p.o. intake was around 4 PM, cheeseburger.  He is not on blood thinners, denies prior abdominal surgeries.  Right lower quadrant abdominal pain, fever yesterday but none today.  Appetite is typical.  No vomiting or diarrhea.  CT abdomen pelvis at 15:05 today  IMPRESSION: 1. Thickened proximal appendix with adjacent fat stranding, suspicious for acute appendicitis. 2. Thickening of the wall of the cecum may be reactive to acute appendicitis, however could also represent cecal malignancy. Correlation with colonoscopy should be performed when patient condition allows. 3. Diffuse hepatic steatosis. 4. Cholelithiasis. 5. 1.4 cm nonobstructing LEFT renal calculus.    Past Medical History Past Medical History:  Diagnosis Date   Arthritis    COVID-19 virus infection 01/2019   Essential hypertension, benign    History of colon polyps 2014   History of kidney stones    Nephrolithiasis 06/2014   ER visit   Pure hypercholesterolemia    Patient Active Problem List   Diagnosis Date Noted   Right lower quadrant abdominal pain 10/18/2023   Angioedema 08/30/2023   Status post total replacement of left hip 06/14/2023   Welcome to Medicare preventive visit 04/12/2023   Status post total replacement of right hip 06/01/2022   Crushing injury of left foot 01/08/2020   Advanced directives, counseling/discussion 11/04/2019    Obesity, Class I, BMI 30.0-34.9 (see actual BMI) 11/04/2019   Bilateral primary osteoarthritis of hip 09/02/2019   COVID-19 virus infection 03/02/2019   Erectile dysfunction 01/11/2018   Epidermal inclusion cyst 12/13/2014   Gross hematuria 05/03/2014   Achilles tendon pain 12/31/2013   Rosacea 04/20/2010   Health maintenance examination 04/20/2010   Prediabetes 09/06/2008   Hypertension 12/12/2006   HYPERCHOLESTEROLEMIA 11/25/2006   Home Medication(s) Prior to Admission medications   Medication Sig Start Date End Date Taking? Authorizing Provider  amLODipine  (NORVASC ) 5 MG tablet Take 1 tablet (5 mg total) by mouth daily. 08/30/23   Rilla Baller, MD  chlorthalidone  (HYGROTON ) 25 MG tablet Take 1 tablet (25 mg total) by mouth daily. For blood pressure Patient not taking: Reported on 10/18/2023 10/11/23   Rilla Baller, MD  Multiple Vitamins-Minerals (MENS 50+ MULTIVITAMIN) TABS Take 1 tablet by mouth daily. Alive brand 07/22/23   Rilla Baller, MD  Turmeric-Ginger  130-5 MG CHEW Chew 1 tablet by mouth in the morning and at bedtime. 07/22/23   Rilla Baller, MD  Past Surgical History Past Surgical History:  Procedure Laterality Date   COLONOSCOPY  01/2012   tubular adenoma [name], rpt 5 yrs   COLONOSCOPY  03/2017   hyperplastic polyp, int hem, rpt 5 yrs [name]   TONSILLECTOMY     as a child   TOTAL HIP ARTHROPLASTY Right 06/01/2022   Procedure: RIGHT TOTAL HIP ARTHROPLASTY ANTERIOR APPROACH;  Surgeon: Vernetta Lonni GRADE, MD;  Location: [facility];  Service: Orthopedics;  Laterality: Right;   TOTAL HIP ARTHROPLASTY Left 06/14/2023   Procedure: ARTHROPLASTY, HIP, TOTAL, ANTERIOR APPROACH;  Surgeon: Vernetta Lonni GRADE, MD;  Location: [facility];  Service: Orthopedics;  Laterality: Left;   Family History Family History  Problem Relation Age of  Onset   Hypertension Mother    Heart disease Mother        enlarged heart   Colon cancer Sister 7   Breast cancer Sister 30   Diabetes Sister    Kidney disease Sister    HIV Brother    Stomach cancer Neg Hx    CAD Neg Hx    Stroke Neg Hx     Social History Social History   Tobacco Use   Smoking status: Never   Smokeless tobacco: Never  Vaping Use   Vaping status: Never Used  Substance Use Topics   Alcohol use: Not Currently    Alcohol/week: 3.0 standard drinks of alcohol    Types: 3 Glasses of wine per week   Drug use: No   Allergies Dust mite extract, Benazepril , and Celebrex  [celecoxib ]  Review of Systems A thorough review of systems was obtained and all systems are negative except as noted in the HPI and PMH.   Physical Exam Vital Signs  I have reviewed the triage vital signs BP (!) 157/85   Pulse (!) 108   Temp 98.4 F (36.9 C)   Resp 18   SpO2 98%  Physical Exam Vitals and nursing note reviewed.  Constitutional:      General: He is not in acute distress.    Appearance: Normal appearance. He is well-developed. He is not ill-appearing.  HENT:     Head: Normocephalic and atraumatic.     Right Ear: External ear normal.     Left Ear: External ear normal.     Nose: Nose normal.     Mouth/Throat:     Mouth: Mucous membranes are moist.  Eyes:     General: No scleral icterus.       Right eye: No discharge.        Left eye: No discharge.  Cardiovascular:     Rate and Rhythm: Normal rate.  Pulmonary:     Effort: Pulmonary effort is normal. No respiratory distress.     Breath sounds: No stridor.  Abdominal:     General: Abdomen is flat. There is no distension.     Tenderness: There is abdominal tenderness. There is no guarding. Positive signs include Rovsing's sign and McBurney's sign.   Musculoskeletal:        General: No deformity.     Cervical back: No rigidity.  Skin:    General: Skin is warm and dry.     Coloration: Skin is not cyanotic,  jaundiced or pale.  Neurological:     Mental Status: He is alert.  Psychiatric:        Speech: Speech normal.        Behavior: Behavior normal. Behavior is cooperative.     ED Results and Treatments Labs (all labs  ordered are listed, but only abnormal results are displayed) Labs Reviewed  CBC WITH DIFFERENTIAL/PLATELET - Abnormal; Notable for the following components:      Result Value   WBC 10.8 (*)    Neutro Abs 8.5 (*)    All other components within normal limits  COMPREHENSIVE METABOLIC PANEL WITH GFR - Abnormal; Notable for the following components:   Glucose, Bld 165 (*)    All other components within normal limits  LIPASE, BLOOD  PROTIME-INR  I-STAT CG4 LACTIC ACID, ED  TYPE AND SCREEN                                                                                                                          Radiology CT ABDOMEN PELVIS W CONTRAST Result Date: 10/18/2023 CLINICAL DATA:  RIGHT lower quadrant abdominal pain for 2 days. Constipation. EXAM: CT ABDOMEN AND PELVIS WITH CONTRAST TECHNIQUE: Multidetector CT imaging of the abdomen and pelvis was performed using the standard protocol following bolus administration of intravenous contrast. RADIATION DOSE REDUCTION: This exam was performed according to the departmental dose-optimization program which includes automated exposure control, adjustment of the mA and/or kV according to patient size and/or use of iterative reconstruction technique. CONTRAST:  ISOVUE -300 IOPAMIDOL  (ISOVUE -300) INJECTION 61% COMPARISON:  08/28/2018 FINDINGS: Lower chest: No acute abnormality. Hepatobiliary: Diffuse hepatic steatosis. No focal hepatic lesion. Minimal cholelithiasis. No bile duct dilatation. Pancreas: Unremarkable. No pancreatic ductal dilatation or surrounding inflammatory changes. Spleen: Normal in size without focal abnormality. Adrenals/Urinary Tract: Adrenal glands are normal. 2.7 cm LEFT and 2.3 cm RIGHT simple renal cysts do not  require dedicated imaging surveillance. 1.4 cm nonobstructing calculus seen in the mid LEFT kidney. No hydronephrosis or hydroureter. Evaluation of the bladder limited due to underdistention and streak artifact from BILATERAL total hip prostheses. Stomach/Bowel: Small duodenal diverticulum is seen. No small bowel dilatation to indicate ileus or obstruction. The proximal appendix is thickened measuring up to 11 mm with adjacent fat stranding, suspicious for acute appendicitis. There is also thickening of the wall of the cecum, best seen on image 71 of series 6. This may be reactive to acute appendicitis, however could also represent cecal malignancy. Vascular/Lymphatic: Aortic atherosclerosis. No enlarged abdominal or pelvic lymph nodes. Reproductive: Unable to evaluate the prostate due to streak artifact from BILATERAL total hip prosthesis. Other: Small fat containing umbilical hernia. Musculoskeletal: No acute or significant osseous findings. IMPRESSION: 1. Thickened proximal appendix with adjacent fat stranding, suspicious for acute appendicitis. 2. Thickening of the wall of the cecum may be reactive to acute appendicitis, however could also represent cecal malignancy. Correlation with colonoscopy should be performed when patient condition allows. 3. Diffuse hepatic steatosis. 4. Cholelithiasis. 5. 1.4 cm nonobstructing LEFT renal calculus. These results were called by telephone at the time of interpretation on 10/18/2023 at 3:30 pm to provider ANTON BLAS , who verbally acknowledged these results. Electronically Signed   By: Aliene Lloyd M.D.   On: 10/18/2023 15:31    Pertinent  labs & imaging results that were available during my care of the patient were reviewed by me and considered in my medical decision making (see MDM for details).  Medications Ordered in ED Medications  piperacillin -tazobactam (ZOSYN ) IVPB 3.375 g (3.375 g Intravenous New Bag/Given 10/18/23 1735)  sodium chloride  0.9 % bolus 1,000  mL (1,000 mLs Intravenous New Bag/Given 10/18/23 1735)                                                                                                                                     Procedures Procedures  (including critical care time)  Medical Decision Making / ED Course    Medical Decision Making:    Carden Teel is a 68 y.o. male with past medical history as below, significant for hypertension, nephrolithiasis, HLD, obesity who presents to the ED with complaint of acute appendicitis. The complaint involves an extensive differential diagnosis and also carries with it a high risk of complications and morbidity.  Serious etiology was considered. Ddx includes but is not limited to: Differential diagnosis includes but is not exclusive to acute appendicitis, renal colic, testicular torsion, urinary tract infection, prostatitis,  diverticulitis, small bowel obstruction, colitis, abdominal aortic aneurysm, gastroenteritis, constipation etc.   Complete initial physical exam performed, notably the patient was in no acute distress, HDS, afebrile.    Reviewed and confirmed nursing documentation for past medical history, family history, social history.  Vital signs reviewed.    Acute appendicitis> - Outpatient CT imaging with acute appendicitis without perforation - Last p.o. intake was around 4 PM, cheeseburger. - Abdomen is TTP right lower quadrant, not peritoneal.  No fever, lactic acid is not elevated.  Give Zosyn .  He denies need for pain medicine or antiemetic - CT also was revealing for thickening to the wall of the cecum, possibly secondary to appendicitis versus malignancy, likely needs colonoscopy   Spoke w/ Dr Aron, will admit for surgical planning, likely tomorrow as pt ate around 1600 today                  Additional history obtained: -Additional history obtained from spouse -External records from outside source obtained and reviewed including: Chart review  including previous notes, labs, imaging, consultation notes including  Recent labs and imaging, home medications   Lab Tests: -I ordered, reviewed, and interpreted labs.   The pertinent results include:   Labs Reviewed  CBC WITH DIFFERENTIAL/PLATELET - Abnormal; Notable for the following components:      Result Value   WBC 10.8 (*)    Neutro Abs 8.5 (*)    All other components within normal limits  COMPREHENSIVE METABOLIC PANEL WITH GFR - Abnormal; Notable for the following components:   Glucose, Bld 165 (*)    All other components within normal limits  LIPASE, BLOOD  PROTIME-INR  I-STAT CG4 LACTIC ACID, ED  TYPE AND SCREEN    Notable for mild leukocytosis  EKG   EKG Interpretation Date/Time:    Ventricular Rate:    PR Interval:    QRS Duration:    QT Interval:    QTC Calculation:   R Axis:      Text Interpretation:           Imaging Studies ordered: Reviewed CT abdomen pelvis imaging from earlier today, I agree with radiologist   Medicines ordered and prescription drug management: Meds ordered this encounter  Medications   piperacillin -tazobactam (ZOSYN ) IVPB 3.375 g    Antibiotic Indication::   Intra-abdominal Infection   sodium chloride  0.9 % bolus 1,000 mL    -I have reviewed the patients home medicines and have made adjustments as needed   Consultations Obtained: I requested consultation with the general surgery,  and discussed lab and imaging findings as well as pertinent plan - they recommend: admit   Cardiac Monitoring: Continuous pulse oximetry interpreted by myself, 99% on RA.    Social Determinants of Health:  Diagnosis or treatment significantly limited by social determinants of health: na   Reevaluation: After the interventions noted above, I reevaluated the patient and found that they have improved  Co morbidities that complicate the patient evaluation  Past Medical History:  Diagnosis Date   Arthritis    COVID-19 virus infection  01/2019   Essential hypertension, benign    History of colon polyps 2014   History of kidney stones    Nephrolithiasis 06/2014   ER visit   Pure hypercholesterolemia       Dispostion: Disposition decision including need for hospitalization was considered, and patient admitted to the hospital.    Final Clinical Impression(s) / ED Diagnoses Final diagnoses:  Acute appendicitis, unspecified acute appendicitis type  Biliary calculus of other site without obstruction  RLQ abdominal pain  Hepatic steatosis        Elnor Jayson LABOR, DO 10/18/23 1904

## 2023-10-18 NOTE — Patient Instructions (Addendum)
 Labs today  Will order CT scan to be done today - will contact you to schedule [phone]. We will be in touch with results.

## 2023-10-18 NOTE — ED Triage Notes (Signed)
 Pt came in after being dx with acute appendicitis today. Pt had a CT scan today with his PCP and they also saw that his colon is swollen as well.

## 2023-10-18 NOTE — Assessment & Plan Note (Addendum)
 Acute lower abd pain with focal pain localizing to RLQ suspicious for diverticulitis r/o appendicitis.  However colonoscopy 2019 didn't mention diverticulosis.  Check labwork today including urinalysis and will order stat CT scan for further evaluation. He has upcoming GI eval next month to discuss repeat colonoscopy.

## 2023-10-18 NOTE — Telephone Encounter (Signed)
 Received call from radiology on CT from today - concern for appendicitis as well as swelling/inflammation of cecum He needs to go to ER. I tried calling - went to voicemail.  Please call pt to notify him to go to ER for further evaluation of possible appendicitis

## 2023-10-19 ENCOUNTER — Encounter (HOSPITAL_COMMUNITY)
Admission: EM | Disposition: A | Discharge status: Home / Self Care | Payer: Self-pay | Source: Ambulatory Visit | Attending: Emergency Medicine

## 2023-10-19 ENCOUNTER — Observation Stay (HOSPITAL_COMMUNITY): Admitting: Certified Registered"

## 2023-10-19 ENCOUNTER — Observation Stay (HOSPITAL_BASED_OUTPATIENT_CLINIC_OR_DEPARTMENT_OTHER): Admitting: Certified Registered"

## 2023-10-19 DIAGNOSIS — K3580 Unspecified acute appendicitis: Secondary | ICD-10-CM

## 2023-10-19 HISTORY — PX: LAPAROSCOPIC APPENDECTOMY: SHX408

## 2023-10-19 LAB — HIV ANTIBODY (ROUTINE TESTING W REFLEX): HIV Screen 4th Generation wRfx: NONREACTIVE

## 2023-10-19 SURGERY — APPENDECTOMY, LAPAROSCOPIC
Anesthesia: General | Site: Abdomen

## 2023-10-19 MED ORDER — PROPOFOL 10 MG/ML IV BOLUS
INTRAVENOUS | Status: DC | PRN
  Administered 2023-10-19: 200 mg via INTRAVENOUS

## 2023-10-19 MED ORDER — OXYCODONE HCL 5 MG PO TABS: 5.0000 mg | ORAL_TABLET | Freq: Once | ORAL | Status: DC | PRN

## 2023-10-19 MED ORDER — LIDOCAINE HCL (PF) 1 % IJ SOLN
INTRAMUSCULAR | Status: DC | PRN
  Administered 2023-10-19: 40 mL

## 2023-10-19 MED ORDER — ONDANSETRON HCL 4 MG/2ML IJ SOLN
INTRAMUSCULAR | Status: DC | PRN
Start: 2023-10-19 — End: 2023-10-19
  Administered 2023-10-19: 4 mg via INTRAVENOUS

## 2023-10-19 MED ORDER — PROPOFOL 10 MG/ML IV BOLUS
INTRAVENOUS | Status: AC
  Filled 2023-10-19: qty 20

## 2023-10-19 MED ORDER — MIDAZOLAM HCL 2 MG/2ML IJ SOLN
INTRAMUSCULAR | Status: DC | PRN
  Administered 2023-10-19: 2 mg via INTRAVENOUS

## 2023-10-19 MED ORDER — PHENYLEPHRINE HCL-NACL 20-0.9 MG/250ML-% IV SOLN
INTRAVENOUS | Status: DC | PRN
  Administered 2023-10-19: 50 ug/min via INTRAVENOUS

## 2023-10-19 MED ORDER — SUGAMMADEX SODIUM 200 MG/2ML IV SOLN
INTRAVENOUS | Status: DC | PRN
  Administered 2023-10-19: 250 mg via INTRAVENOUS

## 2023-10-19 MED ORDER — OXYCODONE HCL 5 MG/5ML PO SOLN: 5.0000 mg | Freq: Once | ORAL | Status: DC | PRN

## 2023-10-19 MED ORDER — DEXAMETHASONE SODIUM PHOSPHATE 10 MG/ML IJ SOLN
INTRAMUSCULAR | Status: DC | PRN
  Administered 2023-10-19: 4 mg via INTRAVENOUS

## 2023-10-19 MED ORDER — LIDOCAINE HCL (PF) 2 % IJ SOLN
INTRAMUSCULAR | Status: DC | PRN
  Administered 2023-10-19: 100 mg via INTRADERMAL

## 2023-10-19 MED ORDER — BUPIVACAINE-EPINEPHRINE (PF) 0.25% -1:200000 IJ SOLN
INTRAMUSCULAR | Status: AC
  Filled 2023-10-19: qty 30

## 2023-10-19 MED ORDER — KETOROLAC TROMETHAMINE 15 MG/ML IJ SOLN
INTRAMUSCULAR | Status: DC | PRN
  Administered 2023-10-19: 15 mg via INTRAVENOUS

## 2023-10-19 MED ORDER — ACETAMINOPHEN 10 MG/ML IV SOLN
INTRAVENOUS | Status: AC
  Filled 2023-10-19: qty 100

## 2023-10-19 MED ORDER — FENTANYL CITRATE PF 50 MCG/ML IJ SOSY
PREFILLED_SYRINGE | INTRAMUSCULAR | Status: AC
  Filled 2023-10-19: qty 3

## 2023-10-19 MED ORDER — MIDAZOLAM HCL 2 MG/2ML IJ SOLN
INTRAMUSCULAR | Status: AC
  Filled 2023-10-19: qty 2

## 2023-10-19 MED ORDER — ONDANSETRON 4 MG PO TBDP: 4.0000 mg | ORAL_TABLET | Freq: Four times a day (QID) | ORAL | 0 refills | Status: DC | PRN

## 2023-10-19 MED ORDER — LIDOCAINE HCL (PF) 1 % IJ SOLN
INTRAMUSCULAR | Status: AC
  Filled 2023-10-19: qty 30

## 2023-10-19 MED ORDER — FENTANYL CITRATE (PF) 100 MCG/2ML IJ SOLN
INTRAMUSCULAR | Status: AC
  Filled 2023-10-19: qty 2

## 2023-10-19 MED ORDER — ROCURONIUM BROMIDE 10 MG/ML (PF) SYRINGE
PREFILLED_SYRINGE | INTRAVENOUS | Status: DC | PRN
  Administered 2023-10-19: 60 mg via INTRAVENOUS
  Administered 2023-10-19: 20 mg via INTRAVENOUS

## 2023-10-19 MED ORDER — AMISULPRIDE (ANTIEMETIC) 5 MG/2ML IV SOLN: 10.0000 mg | Freq: Once | INTRAVENOUS | Status: DC | PRN

## 2023-10-19 MED ORDER — FENTANYL CITRATE (PF) 100 MCG/2ML IJ SOLN
INTRAMUSCULAR | Status: DC | PRN
  Administered 2023-10-19: 100 ug via INTRAVENOUS

## 2023-10-19 MED ORDER — OXYCODONE HCL 5 MG PO TABS: 5.0000 mg | ORAL_TABLET | ORAL | 0 refills | Status: DC | PRN

## 2023-10-19 MED ORDER — PHENYLEPHRINE HCL (PRESSORS) 10 MG/ML IV SOLN
INTRAVENOUS | Status: DC | PRN
  Administered 2023-10-19: 100 ug via INTRAVENOUS

## 2023-10-19 MED ORDER — MUPIROCIN 2 % EX OINT
1.0000 | TOPICAL_OINTMENT | Freq: Two times a day (BID) | CUTANEOUS | Status: DC
Start: 2023-10-19 — End: 2023-10-23
  Administered 2023-10-19 – 2023-10-20 (×2): 1 via NASAL
  Filled 2023-10-19 (×2): qty 22

## 2023-10-19 MED ORDER — FENTANYL CITRATE PF 50 MCG/ML IJ SOSY
25.0000 ug | PREFILLED_SYRINGE | INTRAMUSCULAR | Status: DC | PRN
  Administered 2023-10-19 (×3): 50 ug via INTRAVENOUS

## 2023-10-19 MED ORDER — LACTATED RINGERS IR SOLN
Status: DC | PRN
  Administered 2023-10-19: 1000 mL

## 2023-10-19 MED ORDER — LACTATED RINGERS IV SOLN: INTRAVENOUS | Status: DC | PRN

## 2023-10-19 MED ORDER — ACETAMINOPHEN 10 MG/ML IV SOLN
INTRAVENOUS | Status: DC | PRN
  Administered 2023-10-19: 1000 mg via INTRAVENOUS

## 2023-10-19 MED ORDER — PROPOFOL 10 MG/ML IV BOLUS
INTRAVENOUS | Status: AC
Start: 2023-10-19 — End: 2023-10-19
  Filled 2023-10-19: qty 20

## 2023-10-19 SURGICAL SUPPLY — 31 items
BAG COUNTER SPONGE SURGICOUNT (BAG) IMPLANT
CABLE HIGH FREQUENCY MONO STRZ (ELECTRODE) ×1 IMPLANT
CLIP APPLIE ROT 10 11.4 M/L (STAPLE) IMPLANT
COVER SURGICAL LIGHT HANDLE (MISCELLANEOUS) ×1 IMPLANT
CUTTER FLEX LINEAR 45M (STAPLE) IMPLANT
DERMABOND ADVANCED .7 DNX12 (GAUZE/BANDAGES/DRESSINGS) ×1 IMPLANT
DRAPE LAPAROSCOPIC ABDOMINAL (DRAPES) ×1 IMPLANT
ELECT REM PT RETURN 15FT ADLT (MISCELLANEOUS) ×1 IMPLANT
ENDOLOOP SUT PDS II 0 18 (SUTURE) IMPLANT
GLOVE BIO SURGEON STRL SZ 6 (GLOVE) ×1 IMPLANT
GLOVE INDICATOR 6.5 STRL GRN (GLOVE) ×1 IMPLANT
GOWN STRL REUS W/ TWL XL LVL3 (GOWN DISPOSABLE) ×1 IMPLANT
IRRIGATION SUCT STRKRFLW 2 WTP (MISCELLANEOUS) ×1 IMPLANT
KIT BASIN OR (CUSTOM PROCEDURE TRAY) ×1 IMPLANT
KIT TURNOVER KIT A (KITS) ×1 IMPLANT
PENCIL SMOKE EVACUATOR (MISCELLANEOUS) IMPLANT
RELOAD STAPLE 45 2.5 WHT GRN (ENDOMECHANICALS) IMPLANT
RELOAD STAPLE 45 3.5 BLU ETS (ENDOMECHANICALS) IMPLANT
SCISSORS LAP 5X35 DISP (ENDOMECHANICALS) IMPLANT
SET TUBE SMOKE EVAC HIGH FLOW (TUBING) ×1 IMPLANT
SHEARS HARMONIC 36 ACE (MISCELLANEOUS) ×1 IMPLANT
SLEEVE Z-THREAD 5X100MM (TROCAR) ×1 IMPLANT
SPIKE FLUID TRANSFER (MISCELLANEOUS) ×1 IMPLANT
SUT MNCRL AB 4-0 PS2 18 (SUTURE) ×1 IMPLANT
SUT VICRYL 0 UR6 27IN ABS (SUTURE) ×1 IMPLANT
SYSTEM BAG RETRIEVAL 10MM (BASKET) ×1 IMPLANT
TOWEL OR 17X26 10 PK STRL BLUE (TOWEL DISPOSABLE) ×1 IMPLANT
TRAY FOLEY MTR SLVR 16FR STAT (SET/KITS/TRAYS/PACK) IMPLANT
TRAY LAPAROSCOPIC (CUSTOM PROCEDURE TRAY) ×1 IMPLANT
TROCAR BALLN 12MMX100 BLUNT (TROCAR) ×1 IMPLANT
TROCAR Z-THREAD OPTICAL 5X100M (TROCAR) ×1 IMPLANT

## 2023-10-19 NOTE — Anesthesia Procedure Notes (Signed)
 Procedure Name: Intubation Date/Time: 10/19/2023 7:54 AM  Performed by: Eilleen Fellows, CRNAPre-anesthesia Checklist: Patient identified, Emergency Drugs available, Suction available and Patient being monitored Patient Re-evaluated:Patient Re-evaluated prior to induction Oxygen Delivery Method: Circle system utilized Preoxygenation: Pre-oxygenation with 100% oxygen Induction Type: IV induction Ventilation: Mask ventilation without difficulty Laryngoscope Size: Mac and 4 Grade View: Grade II Tube type: Oral Tube size: 7.5 mm Number of attempts: 1 Airway Equipment and Method: Stylet and Oral airway Placement Confirmation: ETT inserted through vocal cords under direct vision, positive ETCO2 and breath sounds checked- equal and bilateral Secured at: 23 cm Tube secured with: Tape Dental Injury: Teeth and Oropharynx as per pre-operative assessment

## 2023-10-19 NOTE — Op Note (Signed)
 Appendectomy, Lap, Procedure Note  Indications: The patient presented with a history of right-sided abdominal pain. A CT revealed findings consistent with acute appendicitis.  Pre-operative Diagnosis: acute appendicitis  Post-operative Diagnosis: Same  Surgeon: Jina Nephew   Assistants: n/a  Anesthesia: General endotracheal anesthesia and Local anesthesia 1% plain lidocaine , 0.25.% bupivacaine , with epinephrine   ASA Class: 2  Procedure Details  The patient was seen again in the Holding Room. The risks, benefits, complications, treatment options, and expected outcomes were discussed with the patient and/or family. The possibilities of perforation of viscus, bleeding, recurrent infection, the need for additional procedures, failure to diagnose a condition, and creating a complication requiring transfusion or operation were discussed. There was concurrence with the proposed plan and informed consent was obtained. The site of surgery was properly noted. The patient was taken to Operating Room, identified as Andre [name] and the procedure verified as Appendectomy. A Time Out was held and the above information confirmed.  The patient was placed in the supine position and general anesthesia was induced, along with placement of orogastric tube, Venodyne boots, and a Foley catheter. The abdomen was prepped and draped in a sterile fashion. Local anesthetic was infiltrated in the infraumbilical region.  A 1.5 cm vertical incision was made just below the umbilicus.  The Kelly clamp was used to spread the subcutaneous tissues.  The fascia was elevated with 2 Kocher clamps and incised with the #11 blade.  A Burnard was used to confirm entrance into the peritoneal cavity.  A pursestring suture was placed around the fascial incision.  The Hasson trocar was inserted into the abdomen and held in place with the tails of the suture.  The pneumoperitoneum was then established to steady pressure of 15 mmHg.      Additional 5 mm cannulas then placed in the left lower quadrant of the abdomen and the suprapubic region under direct visualization.  A careful evaluation of the entire abdomen was carried out. The patient was placed in Trendelenburg and rotated to the left.  The small intestines were retracted in the cephalad and left lateral direction away from the pelvis and right lower quadrant.  The appendix was not immediately visualized.  The small intestine was laying over the top of it in the right lower quadrant and it was adherent to the abdominal wall.  Blunt dissection was used to gently dissect this away.  The tip of the appendix was then located and this was grasped with a locking grasper.  The ligament of Coop was also identified and there was a epiploic appendage from the colon that was lying on top of the appendix.  These were gently dissected away.  The harmonic scalpel was used to dissect the mesoappendix.  The appendix was very friable and partially gangrenous.  The tip came off and was removed immediately from the peritoneal cavity to avoid losing in the abdomen.  Careful gentle dissection was continued with the harmonic and with blunt dissection until the base the appendix was identified at the cecum.  The CT brought up question of malignancy at the cecum, but this appeared to be just the inflammatory changes of the omentum and to the TI.  The base of the appendix and cecum were soft and appeared adequate for holding a staple load.   The appendix was divided at its base using an endo-GIA stapler. Minimal appendiceal stump was left in place. The appendix was removed from the abdomen with an Endocatch bag through the umbilical port.  There  was no evidence of bleeding, leakage, or complication after division of the appendix. Irrigation was also performed and irrigate suctioned from the abdomen as well.  The 5 mm trocars were removed.  The pneumoperitoneum was evacuated from the abdomen.  The pursestring  suture was closed.  An additional 0 Vicryl was used to make sure the palpable fascial defect was closed down.  The trocar site skin wounds were closed with 4-0 Monocryl and dressed with Dermabond.  Instrument, sponge, and needle counts were correct at the conclusion of the case.   Findings: The appendix was found to be inflamed. There were signs of necrosis.  There was not perforation. There was not abscess formation.  Estimated Blood Loss:  Minimal         Drains: none          Specimens: appendix to pathology         Complications:  None; patient tolerated the procedure well.         Disposition: PACU - hemodynamically stable.         Condition: stable

## 2023-10-19 NOTE — Anesthesia Preprocedure Evaluation (Signed)
 Anesthesia Evaluation  Patient identified by MRN, date of birth, ID band Patient awake    Reviewed: Allergy & Precautions, NPO status , Patient's Chart, lab work & pertinent test results  Airway Mallampati: II  TM Distance: >3 FB Neck ROM: Full    Dental   Pulmonary neg pulmonary ROS   breath sounds clear to auscultation       Cardiovascular hypertension, Pt. on medications  Rhythm:Regular Rate:Normal     Neuro/Psych negative neurological ROS     GI/Hepatic Neg liver ROS,,,Acute appendicitis    Endo/Other  negative endocrine ROS    Renal/GU Renal disease     Musculoskeletal  (+) Arthritis ,    Abdominal   Peds  Hematology negative hematology ROS (+)   Anesthesia Other Findings   Reproductive/Obstetrics                              Anesthesia Physical Anesthesia Plan  ASA: 2  Anesthesia Plan: General   Post-op Pain Management: Tylenol  PO (pre-op)*   Induction: Intravenous  PONV Risk Score and Plan: 2 and Dexamethasone  and Ondansetron   Airway Management Planned: Oral ETT  Additional Equipment: None  Intra-op Plan:   Post-operative Plan: Extubation in OR  Informed Consent: I have reviewed the patients History and Physical, chart, labs and discussed the procedure including the risks, benefits and alternatives for the proposed anesthesia with the patient or authorized representative who has indicated his/her understanding and acceptance.     Dental advisory given  Plan Discussed with:   Anesthesia Plan Comments:         Anesthesia Quick Evaluation

## 2023-10-19 NOTE — Interval H&P Note (Signed)
 History and Physical Interval Note:  10/19/2023 7:41 AM  Andre [name]  has presented today for surgery, with the diagnosis of ACUTE APPENDICITIS.  The various methods of treatment have been discussed with the patient and family. After consideration of risks, benefits and other options for treatment, the patient has consented to  Procedure(s): APPENDECTOMY, LAPAROSCOPIC (N/A) as a surgical intervention.  The patient's history has been reviewed, patient examined, no change in status, stable for surgery.  I have reviewed the patient's chart and labs.  Questions were answered to the patient's satisfaction.     Jina Nephew

## 2023-10-19 NOTE — Anesthesia Postprocedure Evaluation (Signed)
 Anesthesia Post Note  Patient: Manus Haralson  Procedure(s) Performed: APPENDECTOMY, LAPAROSCOPIC (Abdomen)     Patient location during evaluation: PACU Anesthesia Type: General Level of consciousness: awake and alert Pain management: pain level controlled Vital Signs Assessment: post-procedure vital signs reviewed and stable Respiratory status: spontaneous breathing, nonlabored ventilation, respiratory function stable and patient connected to nasal cannula oxygen Cardiovascular status: blood pressure returned to baseline and stable Postop Assessment: no apparent nausea or vomiting Anesthetic complications: no   No notable events documented.  Last Vitals:  Vitals:   10/19/23 1017 10/19/23 1028  BP: 112/73 116/75  Pulse: 88 88  Resp: 11 14  Temp:    SpO2: 99% 98%    Last Pain:  Vitals:   10/19/23 1028  TempSrc:   PainSc: 2                  Epifanio Lamar BRAVO

## 2023-10-19 NOTE — Plan of Care (Signed)
  Problem: Education: Goal: Knowledge of General Education information will improve Description: Including pain rating scale, medication(s)/side effects and non-pharmacologic comfort measures Outcome: Progressing   Problem: Clinical Measurements: Goal: Diagnostic test results will improve Outcome: Progressing   Problem: Pain Managment: Goal: General experience of comfort will improve and/or be controlled Outcome: Progressing

## 2023-10-19 NOTE — Transfer of Care (Signed)
 Immediate Anesthesia Transfer of Care Note  Patient: Andre [name]  Procedure(s) Performed: APPENDECTOMY, LAPAROSCOPIC (Abdomen)  Patient Location: PACU  Anesthesia Type:General  Level of Consciousness: awake, alert , oriented, and patient cooperative  Airway & Oxygen Therapy: Patient Spontanous Breathing and Patient connected to face mask oxygen  Post-op Assessment: Report given to RN and Post -op Vital signs reviewed and stable  Post vital signs: Reviewed and stable  Last Vitals:  Vitals Value Taken Time  BP 122/78 10/19/23 09:15  Temp 37.2 C 10/19/23 09:12  Pulse 81 10/19/23 09:16  Resp 21 10/19/23 09:16  SpO2 95 % 10/19/23 09:16  Vitals shown include unfiled device data.  Last Pain:  Vitals:   10/19/23 0912  TempSrc:   PainSc: 0-No pain         Complications: No notable events documented.

## 2023-10-19 NOTE — Progress Notes (Signed)
   10/19/23 0843  TOC Brief Assessment  Insurance and Status Reviewed  Patient has primary care physician Yes  Home environment has been reviewed From home with spouse.  Prior level of function: Independent  Prior/Current Home Services No current home services  Social Drivers of Health Review SDOH reviewed no interventions necessary  Readmission risk has been reviewed Yes  Transition of care needs transition of care needs identified, TOC will continue to follow (Moon to be delivered- pt in surgery at this time.)

## 2023-10-19 NOTE — Discharge Instructions (Signed)
 [facility] ______[facility] LAPAROSCOPIC SURGERY: POST OP INSTRUCTIONS Always review your discharge instruction sheet given to you by the facility where your surgery was performed. IF YOU HAVE DISABILITY OR FAMILY LEAVE FORMS, YOU MUST BRING THEM TO THE OFFICE FOR PROCESSING.   DO NOT GIVE THEM TO YOUR DOCTOR.  A prescription for pain medication may be given to you upon discharge.  Take your pain medication as prescribed, if needed.  If narcotic pain medicine is not needed, then you may take acetaminophen  (Tylenol ) or ibuprofen  (Advil ) as needed. Take your usually prescribed medications unless otherwise directed. If you need a refill on your pain medication, please contact your pharmacy.  They will contact our office to request authorization. Prescriptions will not be filled after 5pm or on week-ends. You should follow a light diet the first few days after arrival home, such as soup and crackers, etc.  Be sure to include lots of fluids daily. Most patients will experience some swelling and bruising in the area of the incisions.  Ice packs will help.  Swelling and bruising can take several days to resolve.  It is common to experience some constipation if taking pain medication after surgery.  Increasing fluid intake and taking a stool softener (such as Colace) will usually help or prevent this problem from occurring.  A mild laxative (Milk of Magnesia or Miralax) should be taken according to package instructions if there are no bowel movements after 48 hours. Unless discharge instructions indicate otherwise, you may remove your bandages 24-48 hours after surgery, and you may shower at that time.  You may have steri-strips (small skin tapes) in place directly over the incision.  These strips should be left on the skin for 7-10 days.  If your surgeon used skin glue on the incision, you may shower in 24 hours.  The glue will flake off over the next 2-3 weeks.  Any sutures or staples will be  removed at the office during your follow-up visit. ACTIVITIES:  You may resume regular (light) daily activities beginning the next day--such as daily self-care, walking, climbing stairs--gradually increasing activities as tolerated.  You may have sexual intercourse when it is comfortable.  Refrain from any heavy lifting or straining until approved by your doctor. You may drive when you are no longer taking prescription pain medication, you can comfortably wear a seatbelt, and you can safely maneuver your car and apply brakes. RETURN TO WORK:  __________________________________________________________ Andre [name] should see your doctor in the office for a follow-up appointment approximately 2-3 weeks after your surgery.  Make sure that you call for this appointment within a day or two after you arrive home to insure a convenient appointment time. OTHER INSTRUCTIONS: __________________________________________________________________________________________________________________________ __________________________________________________________________________________________________________________________ WHEN TO CALL YOUR DOCTOR: Fever over 101.0 Inability to urinate Continued bleeding from incision. Increased pain, redness, or drainage from the incision. Increasing abdominal pain  The clinic staff is available to answer your questions during regular business hours.  Please don't hesitate to call and ask to speak to one of the nurses for clinical concerns.  If you have a medical emergency, go to the nearest emergency room or call 911.  A surgeon from [facility] is always on call at the hospital. [address], [address], [place], [place]  [address] ? [address] [address], [place], [place]   [address] [phone] ? [phone] ? FAX [phone] Web site: [email or web address]

## 2023-10-20 ENCOUNTER — Encounter (HOSPITAL_COMMUNITY): Payer: Self-pay | Admitting: General Surgery

## 2023-10-20 NOTE — Discharge Summary (Signed)
 Physician Discharge Summary  [name] MRN: [id number] DOB/AGE: Aug 19, 1955 68 y.o.  Admit date: 10/18/2023 Discharge date: 10/20/2023  Admission Diagnoses: Principal dx;  acute appendicitis Patient Active Problem List   Diagnosis Date Noted   Right lower quadrant abdominal pain 10/18/2023   Acute appendicitis 10/18/2023   Angioedema 08/30/2023   Status post total replacement of left hip 06/14/2023   Welcome to Medicare preventive visit 04/12/2023   Status post total replacement of right hip 06/01/2022   Crushing injury of left foot 01/08/2020   Advanced directives, counseling/discussion 11/04/2019   Obesity, Class I, BMI 30.0-34.9 (see actual BMI) 11/04/2019   Bilateral primary osteoarthritis of hip 09/02/2019   COVID-19 virus infection 03/02/2019   Erectile dysfunction 01/11/2018   Epidermal inclusion cyst 12/13/2014   Gross hematuria 05/03/2014   Achilles tendon pain 12/31/2013   Rosacea 04/20/2010   Health maintenance examination 04/20/2010   Prediabetes 09/06/2008   Hypertension 12/12/2006   HYPERCHOLESTEROLEMIA 11/25/2006    Discharge Diagnoses:  Principal Problem:   Acute appendicitis And same as above.   Discharged Condition: stable  Hospital Course: Patient was admitted to the floor from the emergency department 10/18/2023.  He was placed on IV fluids and IV antibiotics.  He underwent lap appy 10/19/2023.  He had some localized gangrenous changes of the appendix and significant inflammatory changes but no perforation or abscess.  He is ambulatory and tolerating a regular diet.  He is able to void spontaneously.  He is discharged to home in stable condition on oral analgesics on POD 1.    Consults: None  Significant Diagnostic Studies: labs: Cr 0.69, HCT 44.9  Treatments: surgery: see above  Discharge Exam: Blood pressure (!) 151/90, pulse 76, temperature 97.7 F (36.5 C), temperature source Oral, resp. rate 16, height 6' (1.829 m), weight 103.4  kg, SpO2 99%. General appearance: alert, cooperative, and no distress Resp: breathing comfortably GI: soft, non distended, approp tender  Disposition: Discharge disposition: 01-Home or Self Care       Discharge Instructions     Call MD for:  difficulty breathing, headache or visual disturbances   Complete by: As directed    Call MD for:  difficulty breathing, headache or visual disturbances   Complete by: As directed    Call MD for:  hives   Complete by: As directed    Call MD for:  hives   Complete by: As directed    Call MD for:  persistant nausea and vomiting   Complete by: As directed    Call MD for:  persistant nausea and vomiting   Complete by: As directed    Call MD for:  redness, tenderness, or signs of infection (pain, swelling, redness, odor or green/yellow discharge around incision site)   Complete by: As directed    Call MD for:  redness, tenderness, or signs of infection (pain, swelling, redness, odor or green/yellow discharge around incision site)   Complete by: As directed    Call MD for:  severe uncontrolled pain   Complete by: As directed    Call MD for:  severe uncontrolled pain   Complete by: As directed    Call MD for:  temperature >100.4   Complete by: As directed    Call MD for:  temperature >100.4   Complete by: As directed    Diet - low sodium heart healthy   Complete by: As directed    Increase activity slowly   Complete by: As directed    Increase  activity slowly   Complete by: As directed       Allergies as of 10/20/2023       Reactions   Dust Mite Extract Itching, Other (See Comments)   Itching eyes   Benazepril  Swelling, Other (See Comments)   Angioedema - lips   Celebrex  [celecoxib ] Palpitations        Medication List     TAKE these medications    amLODipine  5 MG tablet Commonly known as: NORVASC  Take 1 tablet (5 mg total) by mouth daily.   chlorthalidone  25 MG tablet Commonly known as: HYGROTON  Take 1 tablet (25 mg  total) by mouth daily. For blood pressure   Mens 50+ Multivitamin Tabs Take 1 tablet by mouth daily. Alive brand   ondansetron  4 MG disintegrating tablet Commonly known as: ZOFRAN -ODT Take 1 tablet (4 mg total) by mouth every 6 (six) hours as needed for nausea.   oxyCODONE  5 MG immediate release tablet Commonly known as: Oxy IR/ROXICODONE  Take 1 tablet (5 mg total) by mouth every 4 (four) hours as needed for moderate pain (pain score 4-6).   Turmeric-Ginger  130-5 MG Chew Chew 1 tablet by mouth in the morning and at bedtime.   TYLENOL  500 MG tablet Generic drug: acetaminophen  Take 500-1,000 mg by mouth every 8 (eight) hours as needed for mild pain (pain score 1-3) (or headaches).        Follow-up Information     Maczis, Puja Gosai, PA-C Follow up in 3 week(s).   Specialty: General Surgery Why: They will call you with appointment Contact information: [address] [place] [address] [place] [address] [phone]                 Signed: Jina Nephew 10/20/2023, 9:01 AM

## 2023-10-22 LAB — SURGICAL PATHOLOGY

## 2023-10-31 ENCOUNTER — Ambulatory Visit: Admitting: Gastroenterology

## 2023-11-22 ENCOUNTER — Ambulatory Visit: Admitting: Family Medicine

## 2023-12-02 ENCOUNTER — Encounter: Payer: Self-pay | Admitting: Radiology

## 2023-12-17 ENCOUNTER — Ambulatory Visit: Admitting: Gastroenterology

## 2023-12-17 ENCOUNTER — Encounter: Payer: Self-pay | Admitting: Gastroenterology

## 2023-12-17 VITALS — BP 132/70 | HR 86 | Ht 72.0 in | Wt 231.0 lb

## 2023-12-17 DIAGNOSIS — Z860100 Personal history of colon polyps, unspecified: Secondary | ICD-10-CM

## 2023-12-17 DIAGNOSIS — K649 Unspecified hemorrhoids: Secondary | ICD-10-CM

## 2023-12-17 DIAGNOSIS — Z800 Family history of malignant neoplasm of digestive organs: Secondary | ICD-10-CM | POA: Diagnosis not present

## 2023-12-17 MED ORDER — NA SULFATE-K SULFATE-MG SULF 17.5-3.13-1.6 GM/177ML PO SOLN: 1.0000 | Freq: Once | ORAL | 0 refills | Status: AC

## 2023-12-17 NOTE — Patient Instructions (Signed)
 You have been scheduled for a colonoscopy. Please follow written instructions given to you at your visit today.   If you use inhalers (even only as needed), please bring them with you on the day of your procedure.  DO NOT TAKE 7 DAYS PRIOR TO TEST- Trulicity (dulaglutide) Ozempic, Wegovy (semaglutide) Mounjaro, Zepbound (tirzepatide) Bydureon Bcise (exanatide extended release)  DO NOT TAKE 1 DAY PRIOR TO YOUR TEST Rybelsus (semaglutide) Adlyxin (lixisenatide) Victoza (liraglutide) Byetta (exanatide) ___________________________________________________________________________  _______________________________________________________  If your blood pressure at your visit was 140/90 or greater, please contact your primary care physician to follow up on this.  _______________________________________________________  If you are age 61 or older, your body mass index should be between 23-30. Your Body mass index is 31.33 kg/m. If this is out of the aforementioned range listed, please consider follow up with your Primary Care Provider.  If you are age 55 or younger, your body mass index should be between 19-25. Your Body mass index is 31.33 kg/m. If this is out of the aformentioned range listed, please consider follow up with your Primary Care Provider.   ________________________________________________________  The [facility] providers would like to encourage you to use MYCHART to communicate with providers for non-urgent requests or questions.  Due to long hold times on the telephone, sending your provider a message by [facility] may be a faster and more efficient way to get a response.  Please allow 48 business hours for a response.  Please remember that this is for non-urgent requests.  _______________________________________________________  [facility] is using a team-based approach to care.  Your team is made up of your doctor and two to three APPS. Our APPS (Nurse  Practitioners and Physician Assistants) work with your physician to ensure care continuity for you. They are fully qualified to address your health concerns and develop a treatment plan. They communicate directly with your gastroenterologist to care for you. Seeing the Advanced Practice Practitioners on your physician's team can help you by facilitating care more promptly, often allowing for earlier appointments, access to diagnostic testing, procedures, and other specialty referrals.

## 2023-12-17 NOTE — Progress Notes (Signed)
 12/17/2023 Andre [name] [id number] 03-21-1955  Discussed the use of AI scribe software for clinical note transcription with the patient, who gave verbal consent to proceed.  History of Present Illness Andre [name] is a 68 year old male who presents for a follow-up regarding a colonoscopy.  He is here to discuss scheduling a follow-up colonoscopy. His last colonoscopy was in February 2019 with Dr. Teressa.  He has a family history of colon cancer, as his sister was diagnosed at age 68 and passed away from the disease. He is careful about his diet, consuming lots of salads, fiber, and little red meat.  He experienced a recent episode of diarrhea and upset stomach after consuming food that was likely spoiled, which also aggravated his hemorrhoids. He noticed bright red blood in the toilet bowl for the first time today. He uses over-the-counter hemorrhoid cream, which provides relief. No regular constipation, straining, or frequent diarrhea. No regular bleeding from hemorrhoids.  In September, he underwent an appendectomy after experiencing fever and abdominal pain. Initially, he attributed the symptoms to dietary indiscretion, but a CT scan confirmed appendicitis, leading to surgery. He was treated with IV antibiotics and had to wait until the following morning for the procedure due to having eaten prior to the diagnosis.  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  Colonoscopy 03/2015: - One 4 mm polyp in the rectum, removed with a cold snare. Resected and retrieved. - Internal hemorrhoids. - The examination was otherwise normal on direct and retroflexion views.  Polyp was hyperplastic.   Past Medical History:  Diagnosis Date   Arthritis    COVID-19 virus infection 01/2019   Essential hypertension, benign    History of colon polyps 2014   History of kidney stones     Nephrolithiasis 06/2014   ER visit   Pure hypercholesterolemia    Past Surgical History:  Procedure Laterality Date   COLONOSCOPY  01/2012   tubular adenoma [name], rpt 5 yrs   COLONOSCOPY  03/2017   hyperplastic polyp, int hem, rpt 5 yrs [name]   LAPAROSCOPIC APPENDECTOMY N/A 10/19/2023   Procedure: APPENDECTOMY, LAPAROSCOPIC;  Surgeon: Aron Shoulders, MD;  Location: [facility];  Service: General;  Laterality: N/A;   TONSILLECTOMY     as a child   TOTAL HIP ARTHROPLASTY Right 06/01/2022   Procedure: RIGHT TOTAL HIP ARTHROPLASTY ANTERIOR APPROACH;  Surgeon: Vernetta Lonni GRADE, MD;  Location: [facility];  Service: Orthopedics;  Laterality: Right;   TOTAL HIP ARTHROPLASTY Left 06/14/2023   Procedure: ARTHROPLASTY, HIP, TOTAL, ANTERIOR APPROACH;  Surgeon: Vernetta Lonni GRADE, MD;  Location: [facility];  Service: Orthopedics;  Laterality: Left;    reports that he has never smoked. He has never used smokeless tobacco. He reports that he does not currently use alcohol after a past usage of about 3.0 standard drinks of alcohol per week. He reports that he does not use drugs. family history includes Breast cancer (age of onset: 74) in his sister; Colon cancer (age of onset: 33) in his sister; Diabetes in his sister; HIV in his brother; Heart disease in his mother; Hypertension in his mother; Kidney disease in his sister. Allergies  Allergen Reactions   Dust Mite Extract Itching and Other (See Comments)    Itching eyes   Benazepril  Swelling and Other (See Comments)    Angioedema - lips   Celebrex  [Celecoxib ] Palpitations  Outpatient Encounter Medications as of 12/17/2023  Medication Sig   amLODipine  (NORVASC ) 5 MG tablet Take 1 tablet (5 mg total) by mouth daily.   Multiple Vitamins-Minerals (MENS 50+ MULTIVITAMIN) TABS Take 1 tablet by mouth daily. Alive brand   Turmeric-Ginger  130-5 MG CHEW Chew 1 tablet by mouth in the morning and at bedtime.   TYLENOL  500 MG tablet Take 500-1,000 mg  by mouth every 8 (eight) hours as needed for mild pain (pain score 1-3) (or headaches).   chlorthalidone  (HYGROTON ) 25 MG tablet Take 1 tablet (25 mg total) by mouth daily. For blood pressure (Patient not taking: Reported on 12/17/2023)   ondansetron  (ZOFRAN -ODT) 4 MG disintegrating tablet Take 1 tablet (4 mg total) by mouth every 6 (six) hours as needed for nausea. (Patient not taking: Reported on 12/17/2023)   oxyCODONE  (OXY IR/ROXICODONE ) 5 MG immediate release tablet Take 1 tablet (5 mg total) by mouth every 4 (four) hours as needed for moderate pain (pain score 4-6). (Patient not taking: Reported on 12/17/2023)   No facility-administered encounter medications on file as of 12/17/2023.     REVIEW OF SYSTEMS  : All other systems reviewed and negative except where noted in the History of Present Illness.   PHYSICAL EXAM: BP 132/70   Pulse 86   Ht 6' (1.829 m)   Wt 231 lb (104.8 kg)   BMI 31.33 kg/m  General: Well developed male in no acute distress Head: Normocephalic and atraumatic Eyes:  Sclerae anicteric, conjunctiva pink. Ears: Normal auditory acuity Lungs: Clear throughout to auscultation; no W/R/R. Heart: Regular rate and rhythm; no M/R/G. Abdomen: Soft, non-distended.  BS present.  Non-tender. Rectal:  Will be done at the time of colonoscopy. Musculoskeletal: Symmetrical with no gross deformities  Skin: No lesions on visible extremities Extremities: No edema  Neurological: Alert oriented x 4, grossly non-focal Psychological:  Alert and cooperative. Normal mood and affect Assessment & Plan Colorectal cancer screening with planned colonoscopy Colorectal cancer screening is due. Last colonoscopy was in February 2019. Family history of colon cancer with sister diagnosed at age 76 and deceased. Recent appendectomy in September 2025. Plan to delay colonoscopy until after the holidays to ensure adequate healing post-surgery. He is aware of the importance of screening due to family  history. - Will schedule colonoscopy for January 2026, post-holidays with Dr. Wilhelmenia.  The risks, benefits, and alternatives to colonoscopy were discussed with the patient and he consents to proceed.  - Will coordinate with him to find a suitable date, preferably a Friday, considering his work schedule.  Hemorrhoids with recent rectal bleeding Recent episode of rectal bleeding, described as bright red blood in the toilet bowl. Hemorrhoids have been bothersome recently, especially after an episode of diarrhea following dietary indiscretion. Over-the-counter hemorrhoid cream has provided relief. Bleeding is not regular and is likely related to hemorrhoids. - Continue using over-the-counter hemorrhoid cream as needed for symptom relief.    CC:  Rilla Baller, MD

## 2023-12-18 NOTE — Progress Notes (Signed)
 Attending Physician's Attestation   I have reviewed the chart.   I agree with the Advanced Practitioner's note, impression, and recommendations with any updates as below.    Corliss Parish, MD [facility] Advanced Endoscopy Office # [phone]

## 2024-02-07 ENCOUNTER — Encounter: Payer: Self-pay | Admitting: Gastroenterology

## 2024-02-07 ENCOUNTER — Ambulatory Visit: Admitting: Gastroenterology

## 2024-02-07 VITALS — BP 127/91 | HR 75 | Temp 98.1°F | Resp 17 | Ht 72.0 in | Wt 231.0 lb

## 2024-02-07 DIAGNOSIS — D122 Benign neoplasm of ascending colon: Secondary | ICD-10-CM

## 2024-02-07 DIAGNOSIS — K642 Third degree hemorrhoids: Secondary | ICD-10-CM

## 2024-02-07 DIAGNOSIS — Z1211 Encounter for screening for malignant neoplasm of colon: Secondary | ICD-10-CM | POA: Diagnosis not present

## 2024-02-07 DIAGNOSIS — D123 Benign neoplasm of transverse colon: Secondary | ICD-10-CM | POA: Diagnosis not present

## 2024-02-07 DIAGNOSIS — Z800 Family history of malignant neoplasm of digestive organs: Secondary | ICD-10-CM

## 2024-02-07 DIAGNOSIS — Z860101 Personal history of adenomatous and serrated colon polyps: Secondary | ICD-10-CM

## 2024-02-07 DIAGNOSIS — Z860100 Personal history of colon polyps, unspecified: Secondary | ICD-10-CM

## 2024-02-07 MED ORDER — SODIUM CHLORIDE 0.9 % IV SOLN: 500.0000 mL | Freq: Once | INTRAVENOUS | Status: DC

## 2024-02-07 MED ORDER — HYDROCORTISONE ACETATE 25 MG RE SUPP: RECTAL | 1 refills | Status: AC

## 2024-02-07 NOTE — Patient Instructions (Signed)
 YOU HAD AN ENDOSCOPIC PROCEDURE TODAY AT THE [facility]:   Refer to the procedure report that was given to you for any specific questions about what was found during the examination.  If the procedure report does not answer your questions, please call your gastroenterologist to clarify.  If you requested that your care partner not be given the details of your procedure findings, then the procedure report has been included in a sealed envelope for you to review at your convenience later.  YOU SHOULD EXPECT: Some feelings of bloating in the abdomen. Passage of more gas than usual.  Walking can help get rid of the air that was put into your GI tract during the procedure and reduce the bloating. If you had a lower endoscopy (such as a colonoscopy or flexible sigmoidoscopy) you may notice spotting of blood in your stool or on the toilet paper. If you underwent a bowel prep for your procedure, you may not have a normal bowel movement for a few days.  Please Note:  You might notice some irritation and congestion in your nose or some drainage.  This is from the oxygen used during your procedure.  There is no need for concern and it should clear up in a day or so.  SYMPTOMS TO REPORT IMMEDIATELY:  Following lower endoscopy (colonoscopy or flexible sigmoidoscopy):  Excessive amounts of blood in the stool  Significant tenderness or worsening of abdominal pains  Swelling of the abdomen that is new, acute  Fever of 100F or higher   For urgent or emergent issues, a gastroenterologist can be reached at any hour by calling [phone]. Do not use MyChart messaging for urgent concerns.    DIET:  We do recommend a small meal at first, but then you may proceed to your regular diet.  Drink plenty of fluids but you should avoid alcoholic beverages for 24 hours. High Fiber Diet.  MEDICATIONS: Continue present medications. Use Anusol  suppositories per rectum, 1 suppository at bedtime nightly for 1  week, then every other night at bedtime until prescription is gone.  FOLLOW UP: Await pathology results. Repeat colonoscopy in 3-5 years for surveillance.  Educational handouts given to patient: Polyps, Hemorrhoids, High Fiber diet.  Thank you for allowing us  to provide for your healthcare needs today.  ACTIVITY:  You should plan to take it easy for the rest of today and you should NOT DRIVE or use heavy machinery until tomorrow (because of the sedation medicines used during the test).    FOLLOW UP: Our staff will call the number listed on your records the next business day following your procedure.  We will call around 7:15- 8:00 am to check on you and address any questions or concerns that you may have regarding the information given to you following your procedure. If we do not reach you, we will leave a message.     If any biopsies were taken you will be contacted by phone or by letter within the next 1-3 weeks.  Please call us  at [phone] if you have not heard about the biopsies in 3 weeks.    SIGNATURES/CONFIDENTIALITY: You and/or your care partner have signed paperwork which will be entered into your electronic medical record.  These signatures attest to the fact that that the information above on your After Visit Summary has been reviewed and is understood.  Full responsibility of the confidentiality of this discharge information lies with you and/or your care-partner.

## 2024-02-07 NOTE — Progress Notes (Signed)
 "  GASTROENTEROLOGY PROCEDURE H&P NOTE   Primary Care Physician: Andre Baller, MD  HPI: Andre [name] is a 69 y.o. male who presents for Colonoscopy for surveillance of previous polyps and FHx of early colon Cancer.  Past Medical History:  Diagnosis Date   Arthritis    COVID-19 virus infection 01/2019   Essential hypertension, benign    History of colon polyps 2014   History of kidney stones    Nephrolithiasis 06/2014   ER visit   Pure hypercholesterolemia    Past Surgical History:  Procedure Laterality Date   APPENDECTOMY     COLONOSCOPY  01/2012   tubular adenoma Andre [name]), rpt 5 yrs   COLONOSCOPY  03/2017   hyperplastic polyp, int hem, rpt 5 yrs Andre [name])   LAPAROSCOPIC APPENDECTOMY N/A 10/19/2023   Procedure: APPENDECTOMY, LAPAROSCOPIC;  Surgeon: Andre Shoulders, MD;  Location: [facility];  Service: General;  Laterality: N/A;   TONSILLECTOMY     as a child   TOTAL HIP ARTHROPLASTY Right 06/01/2022   Procedure: RIGHT TOTAL HIP ARTHROPLASTY ANTERIOR APPROACH;  Surgeon: Andre Lonni GRADE, MD;  Location: [facility];  Service: Orthopedics;  Laterality: Right;   TOTAL HIP ARTHROPLASTY Left 06/14/2023   Procedure: ARTHROPLASTY, HIP, TOTAL, ANTERIOR APPROACH;  Surgeon: Andre Lonni GRADE, MD;  Location: [facility];  Service: Orthopedics;  Laterality: Left;   Current Outpatient Medications  Medication Sig Dispense Refill   amLODipine  (NORVASC ) 5 MG tablet Take 1 tablet (5 mg total) by mouth daily. 90 tablet 3   Multiple Vitamins-Minerals (MENS 50+ MULTIVITAMIN) TABS Take 1 tablet by mouth daily. Alive brand     Turmeric-Ginger  130-5 MG CHEW Chew 1 tablet by mouth in the morning and at bedtime.     chlorthalidone  (HYGROTON ) 25 MG tablet Take 1 tablet (25 mg total) by mouth daily. For blood pressure (Patient not taking: No sig reported) 90 tablet 1   TYLENOL  500 MG tablet Take 500-1,000 mg by mouth every 8 (eight) hours as needed for mild pain (pain score 1-3) (or headaches).      Current Facility-Administered Medications  Medication Dose Route Frequency Provider Last Rate Last Admin   0.9 %  sodium chloride  infusion  500 mL Intravenous Once Andre [name]., MD       Current Medications[1] Allergies[2] Family History  Problem Relation Age of Onset   Hypertension Mother    Heart disease Mother        enlarged heart   Colon cancer Sister 62   Breast cancer Sister 77   Diabetes Sister    Kidney disease Sister    HIV Brother    Stomach cancer Neg Hx    CAD Neg Hx    Stroke Neg Hx    Esophageal cancer Neg Hx    Rectal cancer Neg Hx    Social History   Socioeconomic History   Marital status: Married    Spouse name: Not on file   Number of children: 2   Years of education: Not on file   Highest education level: Not on file  Occupational History   Occupation: Airline Pilot and part delivery/auto    Employer: [employer]  Tobacco Use   Smoking status: Never   Smokeless tobacco: Never  Vaping Use   Vaping status: Never Used  Substance and Sexual Activity   Alcohol use: Not Currently    Alcohol/week: 3.0 standard drinks of alcohol    Types: 3 Glasses of wine per week   Drug use: No   Sexual activity:  Yes  Other Topics Concern   Not on file  Social History Narrative   Lives with wife, no pets   Edu: 1 yr college   Occupation: Paediatric Nurse   Activity: no regular exercise   Diet: good water , fruits/vegetables daily   Social Drivers of Health   Tobacco Use: Low Risk (02/07/2024)   Patient History    Smoking Tobacco Use: Never    Smokeless Tobacco Use: Never    Passive Exposure: Not on file  Recent Concern: Tobacco Use - Medium Risk (11/14/2023)   Received from [facility]   Patient History    Smoking Tobacco Use: Former    Smokeless Tobacco Use: Never    Passive Exposure: Not on file  Financial Resource Strain: Low Risk (04/12/2023)   Overall Financial Resource Strain (CARDIA)     Difficulty of Paying Living Expenses: Not hard at all  Food Insecurity: No Food Insecurity (10/19/2023)   Epic    Worried About Radiation Protection Practitioner of Food in the Last Year: Never true    Ran Out of Food in the Last Year: Never true  Transportation Needs: No Transportation Needs (10/19/2023)   Epic    Lack of Transportation (Medical): No    Lack of Transportation (Non-Medical): No  Physical Activity: Insufficiently Active (04/12/2023)   Exercise Vital Sign    Days of Exercise per Week: 5 days    Minutes of Exercise per Session: 20 min  Stress: No Stress Concern Present (04/12/2023)   [employer] of Occupational Health - Occupational Stress Questionnaire    Feeling of Stress : Not at all  Social Connections: Socially Integrated (10/19/2023)   Social Connection and Isolation Panel    Frequency of Communication with Friends and Family: Twice a week    Frequency of Social Gatherings with Friends and Family: Once a week    Attends Religious Services: More than 4 times per year    Active Member of Clubs or Organizations: Not on file    Attends Banker Meetings: 1 to 4 times per year    Marital Status: Married  Catering Manager Violence: Not At Risk (10/19/2023)   Epic    Fear of Current or Ex-Partner: No    Emotionally Abused: No    Physically Abused: No    Sexually Abused: No  Depression (PHQ2-9): Low Risk (10/18/2023)   Depression (PHQ2-9)    PHQ-2 Score: 0  Alcohol Screen: Low Risk (04/12/2023)   Alcohol Screen    Last Alcohol Screening Score (AUDIT): 0  Housing: Unknown (11/14/2023)   Received from [facility]   Epic    Unable to Pay for Housing in the Last Year: Not on file    Number of Times Moved in the Last Year: Not on file    At any time in the past 12 months, were you homeless or living in a shelter (including now)?: No  Utilities: Not At Risk (10/19/2023)   Epic    Threatened with loss of utilities: No  Health Literacy: Adequate Health Literacy  (04/12/2023)   B1300 Health Literacy    Frequency of need for help with medical instructions: Never    Physical Exam: Today's Vitals   02/07/24 1030 02/07/24 1032  BP: (!) 163/93   Pulse: 86   Temp: 98.1 F (36.7 C) 98.1 F (36.7 C)  TempSrc: Skin   SpO2: 95%   Weight: 231 lb (104.8 kg)   Height: 6' (1.829 m)  Body mass index is 31.33 kg/m. GEN: NAD EYE: Sclerae anicteric ENT: MMM CV: Non-tachycardic GI: Soft, NT/ND NEURO:  Alert & Oriented x 3  Lab Results: No results for input(s): WBC, HGB, HCT, PLT in the last 72 hours. BMET No results for input(s): NA, K, CL, CO2, GLUCOSE, BUN, CREATININE, CALCIUM in the last 72 hours. LFT No results for input(s): PROT, ALBUMIN, AST, ALT, ALKPHOS, BILITOT, BILIDIR, IBILI in the last 72 hours. PT/INR No results for input(s): LABPROT, INR in the last 72 hours.   Impression / Plan: This is a 69 y.o.male who presents for Colonoscopy for surveillance of previous polyps and FHx of early colon Cancer.  The risks and benefits of endoscopic evaluation/treatment were discussed with the patient and/or family; these include but are not limited to the risk of perforation, infection, bleeding, missed lesions, lack of diagnosis, severe illness requiring hospitalization, as well as anesthesia and sedation related illnesses.  The patient's history has been reviewed, patient examined, no change in status, and deemed stable for procedure.  The patient and/or family was provided an opportunity to ask questions and all were answered.  The patient and/or family is agreeable to proceed.    Aloha Finner, MD [facility] Office # [phone]     [1]  Current Outpatient Medications:    amLODipine  (NORVASC ) 5 MG tablet, Take 1 tablet (5 mg total) by mouth daily., Disp: 90 tablet, Rfl: 3   Multiple Vitamins-Minerals (MENS 50+ MULTIVITAMIN) TABS, Take 1 tablet by mouth  daily. Alive brand, Disp: , Rfl:    Turmeric-Ginger  130-5 MG CHEW, Chew 1 tablet by mouth in the morning and at bedtime., Disp: , Rfl:    chlorthalidone  (HYGROTON ) 25 MG tablet, Take 1 tablet (25 mg total) by mouth daily. For blood pressure (Patient not taking: No sig reported), Disp: 90 tablet, Rfl: 1   TYLENOL  500 MG tablet, Take 500-1,000 mg by mouth every 8 (eight) hours as needed for mild pain (pain score 1-3) (or headaches)., Disp: , Rfl:   Current Facility-Administered Medications:    0.9 %  sodium chloride  infusion, 500 mL, Intravenous, Once, Andre [name]., MD [2]  Allergies Allergen Reactions   Benazepril  Swelling and Other (See Comments)    Angioedema - lips   Dust Mite Extract Itching and Other (See Comments)    Itching eyes   Celebrex  [Celecoxib ] Palpitations   "

## 2024-02-07 NOTE — Progress Notes (Signed)
 Called to room to assist during endoscopic procedure.  Patient ID and intended procedure confirmed with present staff. Received instructions for my participation in the procedure from the performing physician.

## 2024-02-07 NOTE — Op Note (Signed)
 [facility] Patient Name: Andre [name] Procedure Date: 02/07/2024 10:50 AM MRN: [id number] Endoscopist: Aloha Finner , MD, [id number] Age: 69 Referring MD:  Date of Birth: August 03, 1955 Gender: Male Account #: [id number] Procedure:                Colonoscopy Indications:              Screening in patient at increased risk: Colorectal                            cancer in sister before age 58, High risk colon                            cancer surveillance: Personal history of adenoma                            less than 10 mm in size Medicines:                Monitored Anesthesia Care Procedure:                Pre-Anesthesia Assessment:                           - Prior to the procedure, a History and Physical                            was performed, and patient medications and                            allergies were reviewed. The patient's tolerance of                            previous anesthesia was also reviewed. The risks                            and benefits of the procedure and the sedation                            options and risks were discussed with the patient.                            All questions were answered, and informed consent                            was obtained. Prior Anticoagulants: The patient has                            taken no anticoagulant or antiplatelet agents. ASA                            Grade Assessment: II - A patient with mild systemic                            disease. After reviewing the risks and benefits,  the patient was deemed in satisfactory condition to                            undergo the procedure.                           After obtaining informed consent, the colonoscope                            was passed under direct vision. Throughout the                            procedure, the patient's blood pressure, pulse, and                            oxygen saturations were monitored  continuously. The                            Olympus Scope SN: [id number] was introduced through                            the anus and advanced to the 3 cm into the ileum.                            The colonoscopy was performed without difficulty.                            The patient tolerated the procedure. The quality of                            the bowel preparation was adequate. The terminal                            ileum, ileocecal valve, appendiceal orifice, and                            rectum were photographed. Scope In: 10:59:09 AM Scope Out: 11:11:29 AM Scope Withdrawal Time: 0 hours 9 minutes 53 seconds  Total Procedure Duration: 0 hours 12 minutes 20 seconds  Findings:                 The digital rectal exam findings include                            hemorrhoids. Pertinent negatives include no                            palpable rectal lesions.                           The terminal ileum and ileocecal valve appeared                            normal.  Three sessile polyps were found in the hepatic                            flexure (1) and ascending colon (2). The polyps                            were 2 to 4 mm in size. These polyps were removed                            with a cold snare. Resection and retrieval were                            complete.                           Normal mucosa was found in the entire colon                            otherwise.                           Non-bleeding non-thrombosed internal hemorrhoids                            were found during retroflexion, during perianal                            exam and during digital exam. The hemorrhoids were                            Grade III (internal hemorrhoids that prolapse but                            require manual reduction). Complications:            No immediate complications. Estimated Blood Loss:     Estimated blood loss was minimal. Impression:                - Hemorrhoids found on digital rectal exam.                           - The examined portion of the ileum was normal.                           - Three 2 to 4 mm polyps at the hepatic flexure and                            in the ascending colon, removed with a cold snare.                            Resected and retrieved.                           - Normal mucosa in the entire examined colon  otherwise.                           - Non-bleeding non-thrombosed internal hemorrhoids. Recommendation:           - The patient will be observed post-procedure,                            until all discharge criteria are met.                           - Discharge patient to home.                           - Patient has a contact number available for                            emergencies. The signs and symptoms of potential                            delayed complications were discussed with the                            patient. Return to normal activities tomorrow.                            Written discharge instructions were provided to the                            patient.                           - High fiber diet.                           - Use FiberCon 1-2 tablets PO daily.                           - Continue present medications.                           - Await pathology results.                           - Repeat colonoscopy in 3 - 5 years for                            surveillance.                           - The findings and recommendations were discussed                            with the patient.                           - The findings and recommendations were discussed  with the patient's family. Aloha Finner, MD 02/07/2024 11:16:08 AM

## 2024-02-07 NOTE — Progress Notes (Signed)
 Report to PACU, RN, vss, BBS= Clear.

## 2024-02-10 ENCOUNTER — Telehealth: Payer: Self-pay | Admitting: *Deleted

## 2024-02-10 NOTE — Telephone Encounter (Signed)
" °  Follow up Call-     02/07/2024   10:32 AM  Call back number  Post procedure Call Back phone  # [phone]  Permission to leave phone message Yes    Post procedure follow up phone call. No answer at number given.  Left message on voicemail.  "

## 2024-02-13 LAB — SURGICAL PATHOLOGY

## 2024-02-21 ENCOUNTER — Ambulatory Visit: Payer: Self-pay | Admitting: Gastroenterology

## 2024-02-24 ENCOUNTER — Encounter: Payer: Self-pay | Admitting: Family Medicine

## 2024-02-26 NOTE — Telephone Encounter (Signed)
 Mychart message sent asking pt to call office and make an appt.

## 2024-03-06 ENCOUNTER — Ambulatory Visit: Admitting: Family Medicine

## 2024-03-06 ENCOUNTER — Encounter: Payer: Self-pay | Admitting: Family Medicine

## 2024-03-06 MED ORDER — CLOTRIMAZOLE-BETAMETHASONE 1-0.05 % EX CREA: 1.0000 | TOPICAL_CREAM | Freq: Every day | CUTANEOUS | 0 refills | Status: AC

## 2024-03-06 NOTE — Patient Instructions (Addendum)
 If your BP stays above 140/90 at home then let us  know.  Take care.  Glad to see you.  Try using lotrisone  and update us  if not better in the next week.

## 2024-03-06 NOTE — Progress Notes (Unsigned)
 BP is lower at home.    Lesion medial L knee.  No known bite.  It appeared one day.  Used OTC topical med w/o relief.  Itches some of the time. Present for 3 weeks.  Gradually enlarged to current size.  No similar hx prior.    2x2 round red maculopapular lesion w/o ulceration.  No fluctant mass.

## 2024-03-11 ENCOUNTER — Ambulatory Visit: Admitting: Orthopaedic Surgery

## 2024-04-07 ENCOUNTER — Ambulatory Visit

## 2024-04-10 ENCOUNTER — Encounter: Admitting: Family Medicine

## 2024-04-13 ENCOUNTER — Other Ambulatory Visit

## 2024-04-14 ENCOUNTER — Ambulatory Visit

## 2024-04-17 ENCOUNTER — Encounter: Admitting: Family Medicine
